# Patient Record
Sex: Male | Born: 1975
Health system: Southern US, Community
[De-identification: ages and names within clinical notes are randomized; demographics above are authoritative.]

## PROBLEM LIST (undated history)

## (undated) DIAGNOSIS — K625 Hemorrhage of anus and rectum: Secondary | ICD-10-CM

## (undated) DIAGNOSIS — T819XXA Unspecified complication of procedure, initial encounter: Secondary | ICD-10-CM

## (undated) DIAGNOSIS — Z8489 Family history of other specified conditions: Secondary | ICD-10-CM

## (undated) DIAGNOSIS — M1990 Unspecified osteoarthritis, unspecified site: Secondary | ICD-10-CM

## (undated) DIAGNOSIS — Z87442 Personal history of urinary calculi: Secondary | ICD-10-CM

## (undated) DIAGNOSIS — N471 Phimosis: Secondary | ICD-10-CM

## (undated) DIAGNOSIS — F32A Depression, unspecified: Secondary | ICD-10-CM

## (undated) DIAGNOSIS — G4730 Sleep apnea, unspecified: Secondary | ICD-10-CM

## (undated) DIAGNOSIS — M549 Dorsalgia, unspecified: Secondary | ICD-10-CM

## (undated) DIAGNOSIS — F329 Major depressive disorder, single episode, unspecified: Secondary | ICD-10-CM

## (undated) DIAGNOSIS — J45909 Unspecified asthma, uncomplicated: Secondary | ICD-10-CM

## (undated) DIAGNOSIS — I10 Essential (primary) hypertension: Secondary | ICD-10-CM

## (undated) HISTORY — DX: Hemorrhage of anus and rectum: K62.5

## (undated) HISTORY — PX: NO PAST SURGERIES: SHX2092

## (undated) HISTORY — DX: Sleep apnea, unspecified: G47.30

## (undated) HISTORY — DX: Unspecified complication of procedure, initial encounter: T81.9XXA

---

## 1998-03-27 ENCOUNTER — Emergency Department (HOSPITAL_COMMUNITY): Admission: EM | Admit: 1998-03-27 | Discharge: 1998-03-27 | Payer: Self-pay | Admitting: Emergency Medicine

## 1998-07-23 ENCOUNTER — Encounter: Payer: Self-pay | Admitting: Emergency Medicine

## 1998-07-23 ENCOUNTER — Emergency Department (HOSPITAL_COMMUNITY): Admission: EM | Admit: 1998-07-23 | Discharge: 1998-07-23 | Payer: Self-pay | Admitting: *Deleted

## 1998-07-24 ENCOUNTER — Emergency Department (HOSPITAL_COMMUNITY): Admission: EM | Admit: 1998-07-24 | Discharge: 1998-07-24 | Payer: Self-pay | Admitting: Emergency Medicine

## 1998-07-27 ENCOUNTER — Emergency Department (HOSPITAL_COMMUNITY): Admission: EM | Admit: 1998-07-27 | Discharge: 1998-07-27 | Payer: Self-pay | Admitting: Emergency Medicine

## 1998-12-13 ENCOUNTER — Emergency Department (HOSPITAL_COMMUNITY): Admission: EM | Admit: 1998-12-13 | Discharge: 1998-12-13 | Payer: Self-pay | Admitting: Emergency Medicine

## 1999-07-12 ENCOUNTER — Emergency Department (HOSPITAL_COMMUNITY): Admission: EM | Admit: 1999-07-12 | Discharge: 1999-07-12 | Payer: Self-pay | Admitting: Emergency Medicine

## 1999-09-29 ENCOUNTER — Emergency Department (HOSPITAL_COMMUNITY): Admission: EM | Admit: 1999-09-29 | Discharge: 1999-09-29 | Payer: Self-pay | Admitting: Emergency Medicine

## 1999-09-29 ENCOUNTER — Encounter: Payer: Self-pay | Admitting: Emergency Medicine

## 2000-01-18 ENCOUNTER — Emergency Department (HOSPITAL_COMMUNITY): Admission: EM | Admit: 2000-01-18 | Discharge: 2000-01-18 | Payer: Self-pay | Admitting: Emergency Medicine

## 2000-06-22 ENCOUNTER — Encounter: Payer: Self-pay | Admitting: Emergency Medicine

## 2000-06-22 ENCOUNTER — Emergency Department (HOSPITAL_COMMUNITY): Admission: EM | Admit: 2000-06-22 | Discharge: 2000-06-22 | Payer: Self-pay | Admitting: Emergency Medicine

## 2000-07-01 ENCOUNTER — Emergency Department (HOSPITAL_COMMUNITY): Admission: EM | Admit: 2000-07-01 | Discharge: 2000-07-01 | Payer: Self-pay | Admitting: Emergency Medicine

## 2000-07-25 ENCOUNTER — Encounter: Payer: Self-pay | Admitting: Emergency Medicine

## 2000-07-25 ENCOUNTER — Emergency Department (HOSPITAL_COMMUNITY): Admission: EM | Admit: 2000-07-25 | Discharge: 2000-07-25 | Payer: Self-pay | Admitting: Emergency Medicine

## 2000-11-10 ENCOUNTER — Emergency Department (HOSPITAL_COMMUNITY): Admission: AC | Admit: 2000-11-10 | Discharge: 2000-11-10 | Payer: Self-pay

## 2002-03-06 ENCOUNTER — Emergency Department (HOSPITAL_COMMUNITY): Admission: EM | Admit: 2002-03-06 | Discharge: 2002-03-06 | Payer: Self-pay | Admitting: *Deleted

## 2002-03-23 ENCOUNTER — Emergency Department (HOSPITAL_COMMUNITY): Admission: EM | Admit: 2002-03-23 | Discharge: 2002-03-23 | Payer: Self-pay | Admitting: Emergency Medicine

## 2002-12-26 ENCOUNTER — Emergency Department (HOSPITAL_COMMUNITY): Admission: EM | Admit: 2002-12-26 | Discharge: 2002-12-27 | Payer: Self-pay | Admitting: Emergency Medicine

## 2003-11-17 ENCOUNTER — Emergency Department (HOSPITAL_COMMUNITY): Admission: EM | Admit: 2003-11-17 | Discharge: 2003-11-17 | Payer: Self-pay | Admitting: Emergency Medicine

## 2004-04-11 ENCOUNTER — Emergency Department (HOSPITAL_COMMUNITY): Admission: EM | Admit: 2004-04-11 | Discharge: 2004-04-11 | Payer: Self-pay | Admitting: Emergency Medicine

## 2005-08-10 ENCOUNTER — Emergency Department (HOSPITAL_COMMUNITY): Admission: EM | Admit: 2005-08-10 | Discharge: 2005-08-10 | Payer: Self-pay | Admitting: *Deleted

## 2005-09-02 ENCOUNTER — Emergency Department (HOSPITAL_COMMUNITY): Admission: EM | Admit: 2005-09-02 | Discharge: 2005-09-02 | Payer: Self-pay | Admitting: Emergency Medicine

## 2005-10-24 ENCOUNTER — Emergency Department (HOSPITAL_COMMUNITY): Admission: EM | Admit: 2005-10-24 | Discharge: 2005-10-24 | Payer: Self-pay | Admitting: Family Medicine

## 2005-12-23 ENCOUNTER — Emergency Department (HOSPITAL_COMMUNITY): Admission: EM | Admit: 2005-12-23 | Discharge: 2005-12-23 | Payer: Self-pay | Admitting: Emergency Medicine

## 2005-12-27 ENCOUNTER — Emergency Department (HOSPITAL_COMMUNITY): Admission: EM | Admit: 2005-12-27 | Discharge: 2005-12-27 | Payer: Self-pay | Admitting: Family Medicine

## 2006-04-05 ENCOUNTER — Emergency Department (HOSPITAL_COMMUNITY): Admission: EM | Admit: 2006-04-05 | Discharge: 2006-04-05 | Payer: Self-pay | Admitting: Emergency Medicine

## 2009-01-05 ENCOUNTER — Emergency Department (HOSPITAL_COMMUNITY): Admission: EM | Admit: 2009-01-05 | Discharge: 2009-01-05 | Payer: Self-pay | Admitting: Emergency Medicine

## 2009-02-23 ENCOUNTER — Emergency Department (HOSPITAL_COMMUNITY): Admission: EM | Admit: 2009-02-23 | Discharge: 2009-02-23 | Payer: Self-pay | Admitting: Emergency Medicine

## 2009-10-16 ENCOUNTER — Emergency Department (HOSPITAL_BASED_OUTPATIENT_CLINIC_OR_DEPARTMENT_OTHER): Admission: EM | Admit: 2009-10-16 | Discharge: 2009-10-16 | Payer: Self-pay | Admitting: Emergency Medicine

## 2009-10-16 ENCOUNTER — Ambulatory Visit: Payer: Self-pay | Admitting: Diagnostic Radiology

## 2009-10-24 ENCOUNTER — Emergency Department (HOSPITAL_COMMUNITY): Admission: EM | Admit: 2009-10-24 | Discharge: 2009-10-24 | Payer: Self-pay | Admitting: Emergency Medicine

## 2010-04-28 ENCOUNTER — Emergency Department (HOSPITAL_COMMUNITY): Admission: EM | Admit: 2010-04-28 | Discharge: 2010-04-28 | Payer: Self-pay | Admitting: Emergency Medicine

## 2010-09-13 ENCOUNTER — Emergency Department (HOSPITAL_COMMUNITY)
Admission: EM | Admit: 2010-09-13 | Discharge: 2010-09-13 | Disposition: A | Discharge status: Home / Self Care | Payer: Self-pay | Attending: Emergency Medicine | Admitting: Emergency Medicine

## 2010-09-13 DIAGNOSIS — R05 Cough: Secondary | ICD-10-CM | POA: Insufficient documentation

## 2010-09-13 DIAGNOSIS — IMO0001 Reserved for inherently not codable concepts without codable children: Secondary | ICD-10-CM | POA: Insufficient documentation

## 2010-09-13 DIAGNOSIS — J069 Acute upper respiratory infection, unspecified: Secondary | ICD-10-CM | POA: Insufficient documentation

## 2010-09-13 DIAGNOSIS — J3489 Other specified disorders of nose and nasal sinuses: Secondary | ICD-10-CM | POA: Insufficient documentation

## 2010-09-13 DIAGNOSIS — R059 Cough, unspecified: Secondary | ICD-10-CM | POA: Insufficient documentation

## 2010-09-13 DIAGNOSIS — R070 Pain in throat: Secondary | ICD-10-CM | POA: Insufficient documentation

## 2010-10-12 LAB — URINALYSIS, ROUTINE W REFLEX MICROSCOPIC
Glucose, UA: NEGATIVE mg/dL
Hgb urine dipstick: NEGATIVE
Nitrite: NEGATIVE
Specific Gravity, Urine: 1.025 (ref 1.005–1.030)
Urobilinogen, UA: 1 mg/dL (ref 0.0–1.0)
pH: 6 (ref 5.0–8.0)

## 2010-10-12 LAB — DIFFERENTIAL
Basophils Relative: 0 % (ref 0–1)
Eosinophils Absolute: 0.1 10*3/uL (ref 0.0–0.7)
Eosinophils Relative: 2 % (ref 0–5)
Lymphs Abs: 2.1 10*3/uL (ref 0.7–4.0)

## 2010-10-12 LAB — COMPREHENSIVE METABOLIC PANEL
ALT: 15 U/L (ref 0–53)
AST: 21 U/L (ref 0–37)
Albumin: 3.6 g/dL (ref 3.5–5.2)
Alkaline Phosphatase: 102 U/L (ref 39–117)
CO2: 27 mEq/L (ref 19–32)
Chloride: 105 mEq/L (ref 96–112)
Creatinine, Ser: 0.91 mg/dL (ref 0.4–1.5)
GFR calc non Af Amer: >60 mL/min (ref >60)
Potassium: 4 mEq/L (ref 3.5–5.1)

## 2010-10-12 LAB — CBC
MCH: 30.2 pg (ref 26.0–34.0)
Platelets: 159 10*3/uL (ref 150–400)
RBC: 5.03 MIL/uL (ref 4.22–5.81)
RDW: 13.3 % (ref 11.5–15.5)
WBC: 7.7 10*3/uL (ref 4.0–10.5)

## 2010-10-12 LAB — LIPASE, BLOOD: Lipase: 23 U/L (ref 11–59)

## 2010-10-22 LAB — URINALYSIS, ROUTINE W REFLEX MICROSCOPIC
Hgb urine dipstick: NEGATIVE
Nitrite: NEGATIVE
Urobilinogen, UA: 1 mg/dL (ref 0.0–1.0)
pH: 6 (ref 5.0–8.0)

## 2010-12-14 ENCOUNTER — Emergency Department (HOSPITAL_BASED_OUTPATIENT_CLINIC_OR_DEPARTMENT_OTHER): Admission: EM | Admit: 2010-12-14 | Payer: Self-pay | Source: Home / Self Care

## 2010-12-19 ENCOUNTER — Emergency Department (HOSPITAL_COMMUNITY)
Admission: EM | Admit: 2010-12-19 | Discharge: 2010-12-19 | Disposition: A | Discharge status: Home / Self Care | Payer: Self-pay | Attending: Emergency Medicine | Admitting: Emergency Medicine

## 2010-12-19 DIAGNOSIS — E669 Obesity, unspecified: Secondary | ICD-10-CM | POA: Insufficient documentation

## 2010-12-19 DIAGNOSIS — S90569A Insect bite (nonvenomous), unspecified ankle, initial encounter: Secondary | ICD-10-CM | POA: Insufficient documentation

## 2011-10-12 ENCOUNTER — Encounter (HOSPITAL_COMMUNITY): Payer: Self-pay

## 2011-10-12 ENCOUNTER — Emergency Department (HOSPITAL_COMMUNITY)
Admission: EM | Admit: 2011-10-12 | Discharge: 2011-10-12 | Disposition: A | Discharge status: Home / Self Care | Payer: Self-pay | Attending: Emergency Medicine | Admitting: Emergency Medicine

## 2011-10-12 DIAGNOSIS — I10 Essential (primary) hypertension: Secondary | ICD-10-CM | POA: Insufficient documentation

## 2011-10-12 DIAGNOSIS — R05 Cough: Secondary | ICD-10-CM | POA: Insufficient documentation

## 2011-10-12 DIAGNOSIS — R059 Cough, unspecified: Secondary | ICD-10-CM | POA: Insufficient documentation

## 2011-10-12 DIAGNOSIS — H9209 Otalgia, unspecified ear: Secondary | ICD-10-CM | POA: Insufficient documentation

## 2011-10-12 DIAGNOSIS — J069 Acute upper respiratory infection, unspecified: Secondary | ICD-10-CM

## 2011-10-12 DIAGNOSIS — H6690 Otitis media, unspecified, unspecified ear: Secondary | ICD-10-CM

## 2011-10-12 HISTORY — DX: Essential (primary) hypertension: I10

## 2011-10-12 MED ORDER — IBUPROFEN 800 MG PO TABS
800.0000 mg | ORAL_TABLET | Freq: Once | ORAL | Status: AC
Start: 2011-10-12 — End: 2011-10-12
  Administered 2011-10-12: 800 mg via ORAL
  Filled 2011-10-12: qty 1

## 2011-10-12 MED ORDER — ANTIPYRINE-BENZOCAINE 5.4-1.4 % OT SOLN
3.0000 [drp] | Freq: Once | OTIC | Status: AC
  Administered 2011-10-12: 4 [drp] via OTIC
  Filled 2011-10-12: qty 10

## 2011-10-12 MED ORDER — CETIRIZINE-PSEUDOEPHEDRINE ER 5-120 MG PO TB12: 1.0000 | ORAL_TABLET | Freq: Two times a day (BID) | ORAL | Status: DC | PRN

## 2011-10-12 MED ORDER — AZITHROMYCIN 250 MG PO TABS: ORAL_TABLET | ORAL | Status: AC

## 2011-10-12 NOTE — ED Notes (Signed)
Patient presents with a chief complaint of earache to left ear since this AM.  Patient also reporting cough/congestion x 1 week.  Denies sore throat.

## 2011-10-12 NOTE — Discharge Instructions (Signed)
Take zyrtec d as need for congestion. Take zithromax (antibiotic) as prescribed. Take tylenol/motrin as need.  Follow up with primary care doctor in 3-4 days if symptoms fail to improve/resolve.  Return to ER if worse, severe ear pain, severe headache, hearing loss, other concern.     Otitis Media, Adult A middle ear infection is an infection in the space behind the eardrum. The medical name for this is "otitis media." It may happen after a common cold. It is caused by a germ that starts growing in that space. You may feel swollen glands in your neck on the side of the ear infection. HOME CARE INSTRUCTIONS   Take your medicine as directed until it is gone, even if you feel better after the first few days.   Only take over-the-counter or prescription medicines for pain, discomfort, or fever as directed by your caregiver.   Occasional use of a nasal decongestant a couple times per day may help with discomfort and help the eustachian tube to drain better.  Follow up with your caregiver in 10 to 14 days or as directed, to be certain that the infection has cleared. Not keeping the appointment could result in a chronic or permanent injury, pain, hearing loss and disability. If there is any problem keeping the appointment, you must call back to this facility for assistance. SEEK IMMEDIATE MEDICAL CARE IF:   You are not getting better in 2 to 3 days.   You have pain that is not controlled with medication.   You feel worse instead of better.   You cannot use the medication as directed.   You develop swelling, redness or pain around the ear or stiffness in your neck.  MAKE SURE YOU:   Understand these instructions.   Will watch your condition.   Will get help right away if you are not doing well or get worse.  Document Released: 04/20/2004 Document Revised: 07/05/2011 Document Reviewed: 02/20/2008 [facility] Patient Information 2012 [place], [place].

## 2011-10-12 NOTE — ED Provider Notes (Signed)
History     CSN: [id number]  Arrival date & time 10/12/11  1742   First MD Initiated Contact with Patient 10/12/11 1954      Chief Complaint  Patient presents with  . Otalgia  . Cough    (Consider location/radiation/quality/duration/timing/severity/associated sxs/prior treatment) Patient is a 36 y.o. male presenting with ear pain and cough. The history is provided by the patient.  Otalgia Associated symptoms include cough. Pertinent negatives include no headaches, no abdominal pain, no neck pain and no rash.  Cough Associated symptoms include ear pain. Pertinent negatives include no chest pain, no headaches, no shortness of breath and no eye redness.  pt c/o left ear pain. Notes recent 'cold' w nasal congestion, rhinorrhea, scratchy throat, occ non prod cough. Those symptoms improving, but persistent left ear pain. Constant. Dull. Non radiating. No specific exacerbating or alleviating factors. No headache. No hearing loss. No trauma to ear. Hx otitis media.   Past Medical History  Diagnosis Date  . Hypertension     History reviewed. No pertinent past surgical history.  Family History  Problem Relation Age of Onset  . Cancer Mother   . Cancer Father   . Diabetes Father   . Hypertension Father     History  Substance Use Topics  . Smoking status: Never Smoker   . Smokeless tobacco: Not on file  . Alcohol Use: No      Review of Systems  Constitutional: Negative for fever.  HENT: Positive for ear pain. Negative for neck pain.   Eyes: Negative for redness.  Respiratory: Positive for cough. Negative for shortness of breath.   Cardiovascular: Negative for chest pain.  Gastrointestinal: Negative for abdominal pain.  Musculoskeletal: Negative for back pain.  Skin: Negative for rash.  Neurological: Positive for numbness. Negative for headaches.  Hematological: Does not bruise/bleed easily.  Psychiatric/Behavioral: Negative for confusion.    Allergies   Penicillins  Home Medications  No current outpatient prescriptions on file.  BP 126/87  Pulse 94  Temp 98 F (36.7 C)  Resp 18  SpO2 99%  Physical Exam  Nursing note and vitals reviewed. Constitutional: He is oriented to person, place, and time. He appears well-developed and well-nourished. No distress.  HENT:  Head: Atraumatic.       No mastoid tenderness. Nasal congestion. No sinus tenderness. Right eac/tm wnl. Left eac wnl. Left tm erythematous, bulging.   Eyes: Pupils are equal, round, and reactive to light.  Neck: Neck supple. No tracheal deviation present.       No stiffness/rigidity  Cardiovascular: Normal rate, regular rhythm, normal heart sounds and intact distal pulses.   Pulmonary/Chest: Effort normal and breath sounds normal. No accessory muscle usage. No respiratory distress.  Abdominal: Soft. He exhibits no distension. There is no tenderness.  Musculoskeletal: Normal range of motion.  Neurological: He is alert and oriented to person, place, and time.       Hearing grossly intact.   Skin: Skin is warm and dry.  Psychiatric: He has a normal mood and affect.    ED Course  Procedures (including critical care time)     MDM  Auralgan drops. Motrin po. Confirmed w pt only allergy is pcns.         Suzi Roots, MD 10/12/11 2004

## 2011-10-22 ENCOUNTER — Emergency Department (HOSPITAL_COMMUNITY)
Admission: EM | Admit: 2011-10-22 | Discharge: 2011-10-22 | Disposition: A | Discharge status: Home Health | Payer: Self-pay | Attending: Emergency Medicine | Admitting: Emergency Medicine

## 2011-10-22 ENCOUNTER — Encounter (HOSPITAL_COMMUNITY): Payer: Self-pay | Admitting: Emergency Medicine

## 2011-10-22 DIAGNOSIS — I10 Essential (primary) hypertension: Secondary | ICD-10-CM | POA: Insufficient documentation

## 2011-10-22 DIAGNOSIS — H60399 Other infective otitis externa, unspecified ear: Secondary | ICD-10-CM | POA: Insufficient documentation

## 2011-10-22 DIAGNOSIS — H6092 Unspecified otitis externa, left ear: Secondary | ICD-10-CM

## 2011-10-22 MED ORDER — CIPROFLOXACIN-DEXAMETHASONE 0.3-0.1 % OT SUSP: 4.0000 [drp] | Freq: Two times a day (BID) | OTIC | Status: DC

## 2011-10-22 NOTE — ED Notes (Signed)
Pt c/o left ear pain x 2 weeks; pt sts seen for same in past and given meds but not any  better

## 2011-10-22 NOTE — ED Notes (Signed)
Discharge instructions reviewed with pt; verbalized understanding.  No further questions asked; No further c/o's voiced.  Pt ambulatory to lobby.  NAD noted.

## 2011-10-22 NOTE — ED Provider Notes (Signed)
History     CSN: [id number]  Arrival date & time 10/22/11  1059   First MD Initiated Contact with Patient 10/22/11 1132      Chief Complaint  Patient presents with  . Otalgia    (Consider location/radiation/quality/duration/timing/severity/associated sxs/prior treatment) Patient is a 36 y.o. male presenting with ear pain. The history is provided by the patient. No language interpreter was used.  Otalgia This is a recurrent problem. There is pain in the left ear. The problem occurs constantly. The problem has been gradually worsening. There has been no fever. Associated symptoms include hearing loss. Pertinent negatives include no ear discharge, no headaches, no rhinorrhea, no sore throat, no diarrhea, no vomiting, no neck pain, no cough and no rash. His past medical history is significant for chronic ear infection, hearing loss and tympanostomy tube.   L ear pain with decreased hearing x 2 weeks.  States that he had tubes in both ears at age 20.  Was here 2 weeks ago and was given a rx for z-pack and zyrtec.    Past Medical History  Diagnosis Date  . Hypertension     History reviewed. No pertinent past surgical history.  Family History  Problem Relation Age of Onset  . Cancer Mother   . Cancer Father   . Diabetes Father   . Hypertension Father     History  Substance Use Topics  . Smoking status: Never Smoker   . Smokeless tobacco: Not on file  . Alcohol Use: No      Review of Systems  Unable to perform ROS Constitutional: Negative.   HENT: Positive for hearing loss and ear pain. Negative for sore throat, rhinorrhea, neck pain and ear discharge.   Eyes: Negative.   Respiratory: Negative.  Negative for cough and shortness of breath.   Cardiovascular: Negative.   Gastrointestinal: Negative.  Negative for vomiting and diarrhea.  Skin: Negative for rash.  Neurological: Negative.  Negative for headaches.  Psychiatric/Behavioral: Negative.   All other systems reviewed  and are negative.    Allergies  Penicillins  Home Medications   Current Outpatient Rx  Name Route Sig Dispense Refill  . CETIRIZINE-PSEUDOEPHEDRINE ER 5-120 MG PO TB12 Oral Take 1 tablet by mouth 2 (two) times daily as needed for allergies. 20 tablet 0    BP 157/69  Pulse 69  Temp(Src) 97.7 F (36.5 C) (Oral)  Resp 20  SpO2 97%  Physical Exam  Nursing note and vitals reviewed. Constitutional: He is oriented to person, place, and time. He appears well-developed and well-nourished.  HENT:  Head: Normocephalic.  Right Ear: Tympanic membrane normal. No tenderness.  Left Ear: There is tenderness. Decreased hearing is noted.  Nose: Nose normal.  Mouth/Throat: Uvula is midline, oropharynx is clear and moist and mucous membranes are normal.  Eyes: Conjunctivae and EOM are normal. Pupils are equal, round, and reactive to light.  Neck: Normal range of motion. Neck supple.  Cardiovascular: Normal rate.   Pulmonary/Chest: Effort normal.  Abdominal: Soft.  Musculoskeletal: Normal range of motion.  Neurological: He is alert and oriented to person, place, and time.  Skin: Skin is warm and dry.  Psychiatric: He has a normal mood and affect.    ED Course  Procedures (including critical care time)  Labs Reviewed - No data to display No results found.   No diagnosis found.    MDM  Otitis externa.  Unable to see L TM.  Did not flush in ER.  Ciprodex and ENT consult  Jethro Bastos, NP 10/23/11 1128

## 2011-10-22 NOTE — Discharge Instructions (Signed)
Reginald Campbell follow up with ENT this week.  Start the antibiotics today. Otitis Externa Swimmer's ear (otitis externa) is an infection in the outer ear canal. It can be caused by a germ or a fungus. It may be caused by:  Swimming in dirty water.   Water that stays in the ear after swimming.  HOME CARE  Put drops in the ear canal as told by your doctor.   Only take medicine as told by your doctor.  GET HELP RIGHT AWAY IF:   You have a temperature by mouth above 102 F (38.9 C), not controlled by medicine.   There is ear pain after 3 days.  MAKE SURE YOU:   Understand these instructions.   Will watch this condition.   Will get help right away if you are not doing well or get worse.  Document Released: 01/02/2008 Document Revised: 07/05/2011 Document Reviewed: 01/02/2008 [facility] Patient Information 2012 [place], [place].Otitis Externa Swimmer's ear (otitis externa) is an infection in the outer ear canal. It can be caused by a germ or a fungus. It may be caused by:  Swimming in dirty water.   Water that stays in the ear after swimming.  HOME CARE  Put drops in the ear canal as told by your doctor.   Only take medicine as told by your doctor.  GET HELP RIGHT AWAY IF:   You have a temperature by mouth above 102 F (38.9 C), not controlled by medicine.   There is ear pain after 3 days.  MAKE SURE YOU:   Understand these instructions.   Will watch this condition.   Will get help right away if you are not doing well or get worse.  Document Released: 01/02/2008 Document Revised: 07/05/2011 Document Reviewed: 01/02/2008 [facility] Patient Information 2012 [place], [place].Draining Ear Ear wax, pus, blood and other fluids are examples of the different types of drainage from ears. Drops or cream may be needed to lessen the itching which may occur with ear drainage. CAUSES   Skin irritations in the ear.   Ear infection.   Swimmer's ear.   Ruptured eardrum.   Foreign object in  the ear canal.   Sudden pressure changes.   Head injury.  HOME CARE INSTRUCTIONS   Only take over-the-counter or prescription medicines for pain, fever, or discomfort as directed by your caregiver.   Do not rub the ear canal with cotton-tipped swabs.   Do not swim until your caregiver says it is okay.   Before you take a shower, cover a cotton ball with petroleum jelly to keep water out.   Limit exposure to smoke. Secondhand smoke can increase the chance for ear infections.   Keep up with immunizations.   Wash your hands well.   Keep all follow-up appointments to examine the ear and evaluate hearing.  SEEK MEDICAL CARE IF:   You have increased drainage.   You have ear pain, a fever, or drainage that is not getting better after 48 hours of antibiotics.   You are unusually tired.  SEEK IMMEDIATE MEDICAL CARE IF:  You have severe ear pain or headache.   The patient is older than 3 months with a rectal or oral temperature of 102 F (38.9 C) or higher.   The patient is 54 months old or younger with a rectal temperature of 100.4 F (38 C) or higher.   You vomit.   You feel dizzy.   You have a seizure.   You have new hearing loss.  MAKE SURE YOU:   Understand these instructions.   Will watch your condition.   Will get help right away if you are not doing well or get worse.  Document Released: 07/16/2005 Document Revised: 07/05/2011 Document Reviewed: 05/19/2009 [facility] Patient Information 2012 [place], [place].

## 2011-10-24 NOTE — ED Provider Notes (Signed)
Medical screening examination/treatment/procedure(s) were performed by non-physician practitioner and as supervising physician I was immediately available for consultation/collaboration.  Havish Petties, MD 10/24/11 1417 

## 2011-11-11 ENCOUNTER — Encounter (HOSPITAL_COMMUNITY): Payer: Self-pay | Admitting: *Deleted

## 2011-11-11 ENCOUNTER — Emergency Department (HOSPITAL_COMMUNITY)
Admission: EM | Admit: 2011-11-11 | Discharge: 2011-11-11 | Disposition: A | Discharge status: Home / Self Care | Payer: Self-pay | Attending: Emergency Medicine | Admitting: Emergency Medicine

## 2011-11-11 DIAGNOSIS — H6120 Impacted cerumen, unspecified ear: Secondary | ICD-10-CM | POA: Insufficient documentation

## 2011-11-11 DIAGNOSIS — H6090 Unspecified otitis externa, unspecified ear: Secondary | ICD-10-CM

## 2011-11-11 DIAGNOSIS — H60399 Other infective otitis externa, unspecified ear: Secondary | ICD-10-CM | POA: Insufficient documentation

## 2011-11-11 DIAGNOSIS — I10 Essential (primary) hypertension: Secondary | ICD-10-CM | POA: Insufficient documentation

## 2011-11-11 MED ORDER — TRIETHANOLAMINE SALICYLATE LIQD: 2.0000 [drp] | Freq: Two times a day (BID) | Status: DC

## 2011-11-11 MED ORDER — NEOMYCIN-POLYMYXIN-HC 3.5-10000-1 OT SUSP: 4.0000 [drp] | Freq: Three times a day (TID) | OTIC | Status: AC

## 2011-11-11 NOTE — ED Provider Notes (Addendum)
History     CSN: [id number]  Arrival date & time 11/11/11  1946   First MD Initiated Contact with Patient 11/11/11 2042      Chief Complaint  Patient presents with  . Otalgia    (Consider location/radiation/quality/duration/timing/severity/associated sxs/prior treatment) HPI  Pt presents to the ED for ear pain. He has been seen two times previously for the same and given antibiotics. He states that he was referred to ENT but can not afford to see them. He states that he feels as though his ear is clogged. It did feel better for a little while but now is starting to hurt again. No fevers, chills, weakness, headaches, N/V/D.  Past Medical History  Diagnosis Date  . Hypertension     History reviewed. No pertinent past surgical history.  Family History  Problem Relation Age of Onset  . Cancer Mother   . Cancer Father   . Diabetes Father   . Hypertension Father     History  Substance Use Topics  . Smoking status: Never Smoker   . Smokeless tobacco: Not on file  . Alcohol Use: No      Review of Systems  All other systems reviewed and are negative.    Allergies  Penicillins  Home Medications   Current Outpatient Rx  Name Route Sig Dispense Refill  . NEOMYCIN-POLYMYXIN-HC 3.5-10000-1 OT SUSP Otic Place 4 drops in ear(s) 3 (three) times daily. 7.5 mL 0  . TRIETHANOLAMINE SALICYLATE LIQD Otic Place 2 drops in ear(s) 2 (two) times daily. 1 Bottle 0    BP 133/88  Pulse 76  Temp(Src) 98.4 F (36.9 C) (Oral)  Resp 18  Ht 6' (1.829 m)  Wt 330 lb (149.687 kg)  BMI 44.76 kg/m2  SpO2 99%  Physical Exam  Nursing note and vitals reviewed. Constitutional: He appears well-developed and well-nourished. No distress.  HENT:  Head: Normocephalic and atraumatic.  Right Ear: Tympanic membrane normal.  Left Ear: Tympanic membrane normal.       Bilateral canals cerumen impacted.  After cerumen removal, some cerumen remains. Cerumen is very white in color and hard to  differentiate between cerumen and infection. TMs are normal.  Eyes: Pupils are equal, round, and reactive to light.  Neck: Normal range of motion. Neck supple.  Cardiovascular: Normal rate and regular rhythm.   Pulmonary/Chest: Effort normal.  Abdominal: Soft.  Neurological: He is alert.  Skin: Skin is warm and dry.    ED Course  Procedures (including critical care time)  Labs Reviewed - No data to display No results found.   1. Otitis externa       MDM  Ear irrigated by Nurse, large amount of cerumen removed.  After cerumen removal, some cerumen remains. Cerumen is very white in color and hard to differentiate between cerumen and infection. TMs are normal.  Given referral to ENT. Rx for cerumenex and abx otic drops.  Pt has been advised of the symptoms that warrant their return to the ED. Patient has voiced understanding and has agreed to follow-up with the PCP or specialist.        Dorthula Matas, PA 11/11/11 2152  Dorthula Matas, PA 12/28/11 7829

## 2011-11-11 NOTE — ED Notes (Signed)
Flushed both ears with warm water.  Pt tolerated well.  Large amount of ear wax/substance removed.

## 2011-11-11 NOTE — ED Notes (Signed)
Pt reports left ear pain for two weeks.  Pt reports he can not hear out of it. Pt denies drainage currently, but reports previously had ear wax and drainage coming from ear. Pt was seen at [facility] two weeks ago and diagnosed with otitis externa.  Pt reports his mother put too many drops in left ear.

## 2011-11-15 NOTE — ED Provider Notes (Signed)
Medical screening examination/treatment/procedure(s) were performed by non-physician practitioner and as supervising physician I was immediately available for consultation/collaboration.  Aamari Strawderman, MD 11/15/11 1750 

## 2011-11-24 ENCOUNTER — Emergency Department (HOSPITAL_COMMUNITY)
Admission: EM | Admit: 2011-11-24 | Discharge: 2011-11-24 | Disposition: A | Discharge status: Home / Self Care | Payer: Self-pay | Attending: Emergency Medicine | Admitting: Emergency Medicine

## 2011-11-24 ENCOUNTER — Encounter (HOSPITAL_COMMUNITY): Payer: Self-pay

## 2011-11-24 DIAGNOSIS — Z880 Allergy status to penicillin: Secondary | ICD-10-CM | POA: Insufficient documentation

## 2011-11-24 DIAGNOSIS — I10 Essential (primary) hypertension: Secondary | ICD-10-CM | POA: Insufficient documentation

## 2011-11-24 DIAGNOSIS — Z8249 Family history of ischemic heart disease and other diseases of the circulatory system: Secondary | ICD-10-CM | POA: Insufficient documentation

## 2011-11-24 DIAGNOSIS — H60399 Other infective otitis externa, unspecified ear: Secondary | ICD-10-CM | POA: Insufficient documentation

## 2011-11-24 DIAGNOSIS — Z809 Family history of malignant neoplasm, unspecified: Secondary | ICD-10-CM | POA: Insufficient documentation

## 2011-11-24 DIAGNOSIS — Z833 Family history of diabetes mellitus: Secondary | ICD-10-CM | POA: Insufficient documentation

## 2011-11-24 DIAGNOSIS — H6092 Unspecified otitis externa, left ear: Secondary | ICD-10-CM

## 2011-11-24 MED ORDER — NEOMYCIN-POLYMYXIN-HC 3.5-10000-1 OT SUSP: 3.0000 [drp] | Freq: Three times a day (TID) | OTIC | Status: AC

## 2011-11-24 NOTE — ED Notes (Signed)
Pt reports a month of hearing difficulty with mild pain in the left ear

## 2011-11-24 NOTE — Discharge Instructions (Signed)
Otitis Externa Swimmer's ear (otitis externa) is an infection in the outer ear canal. It can be caused by a germ or a fungus. It may be caused by:  Swimming in dirty water.   Water that stays in the ear after swimming.  HOME CARE  Put drops in the ear canal as told by your doctor.   Only take medicine as told by your doctor.  GET HELP RIGHT AWAY IF:   You have a temperature by mouth above 102 F (38.9 C), not controlled by medicine.   There is ear pain after 3 days.  MAKE SURE YOU:   Understand these instructions.   Will watch this condition.   Will get help right away if you are not doing well or get worse.  Document Released: 01/02/2008 Document Revised: 07/05/2011 Document Reviewed: 01/02/2008 [facility] Patient Information 2012 [place], [place].  Apply ear drops 3 times a day to left ear. Then applied cotton wick. Tylenol or Motrin for pain. If symptoms persist recommend seeing an ENT doctor. Number given

## 2011-11-24 NOTE — ED Notes (Signed)
Irrigation of the left ear with 50% peroxide and 50% NS, soaking at this time

## 2011-11-24 NOTE — ED Provider Notes (Signed)
History     CSN: [id number]  Arrival date & time 11/24/11  1249   First MD Initiated Contact with Patient 11/24/11 1300      Chief Complaint  Patient presents with  . Hearing Problem    (Consider location/radiation/quality/duration/timing/severity/associated sxs/prior treatment) HPI.... third ED visit for patient with left ear discomfort for approximately one month. Decreased hearing. No fever, chills, stiff neck. Nothing makes it better or worse. Symptoms are minimal to moderate  Past Medical History  Diagnosis Date  . Hypertension     History reviewed. No pertinent past surgical history.  Family History  Problem Relation Age of Onset  . Cancer Mother   . Cancer Father   . Diabetes Father   . Hypertension Father     History  Substance Use Topics  . Smoking status: Never Smoker   . Smokeless tobacco: Not on file  . Alcohol Use: No      Review of Systems  All other systems reviewed and are negative.    Allergies  Penicillins  Home Medications   Current Outpatient Rx  Name Route Sig Dispense Refill  . NEOMYCIN-POLYMYXIN-HC 3.5-10000-1 OT SUSP Left Ear Place 3 drops into the left ear 3 (three) times daily. 10 mL 0  . TRIETHANOLAMINE OLEATE OT Otic Place 2 drops in ear(s) 2 (two) times daily. Filled 11/11/11 - finished 11/22/11      BP 109/72  Pulse 72  Temp(Src) 97.8 F (36.6 C) (Oral)  Resp 13  SpO2 97%  Physical Exam  Nursing note and vitals reviewed. Constitutional: He is oriented to person, place, and time.       Obese  HENT:  Head: Normocephalic.       Left tympanic membrane obscured by cerumen. External canal appears inflamed and erythematous  Neck: Normal range of motion.  Neurological: He is alert and oriented to person, place, and time.  Skin: Skin is warm and dry.  Psychiatric: He has a normal mood and affect.    ED Course  Procedures (including critical care time)... left ear irrigated with normal saline and hydrogen peroxide. Large  amount of cerumen was mined with a curette.  Left external canal inflamed.  Labs Reviewed - No data to display No results found.   1. Otitis externa, left       MDM  Ear was irrigated. Large amount of cerumen. We'll discharge him with Cortisporin Otic        Donnetta Hutching, MD 11/24/11 1615

## 2011-12-29 NOTE — ED Provider Notes (Signed)
Medical screening examination/treatment/procedure(s) were performed by non-physician practitioner and as supervising physician I was immediately available for consultation/collaboration.  Raeford Razor, MD 12/29/11 [phone]

## 2012-01-14 ENCOUNTER — Encounter (HOSPITAL_COMMUNITY): Payer: Self-pay | Admitting: Emergency Medicine

## 2012-01-14 ENCOUNTER — Emergency Department (HOSPITAL_COMMUNITY)
Admission: EM | Admit: 2012-01-14 | Discharge: 2012-01-14 | Disposition: A | Discharge status: Home / Self Care | Payer: Self-pay

## 2012-01-14 ENCOUNTER — Emergency Department (HOSPITAL_COMMUNITY)
Admission: EM | Admit: 2012-01-14 | Discharge: 2012-01-14 | Disposition: A | Discharge status: Home / Self Care | Payer: Self-pay | Attending: Emergency Medicine | Admitting: Emergency Medicine

## 2012-01-14 DIAGNOSIS — H7290 Unspecified perforation of tympanic membrane, unspecified ear: Secondary | ICD-10-CM | POA: Insufficient documentation

## 2012-01-14 MED ORDER — IBUPROFEN 600 MG PO TABS: 600.0000 mg | ORAL_TABLET | Freq: Four times a day (QID) | ORAL | Status: AC | PRN

## 2012-01-14 MED ORDER — CIPROFLOXACIN-DEXAMETHASONE 0.3-0.1 % OT SUSP: 4.0000 [drp] | Freq: Two times a day (BID) | OTIC | Status: AC

## 2012-01-14 MED ORDER — CIPROFLOXACIN-DEXAMETHASONE 0.3-0.1 % OT SUSP
4.0000 [drp] | Freq: Once | OTIC | Status: AC
  Administered 2012-01-14: 4 [drp] via OTIC
  Filled 2012-01-14: qty 7.5

## 2012-01-14 NOTE — ED Notes (Signed)
PT reports he has had ear congestion for several months that has never fully cleared up, sneezed today and reports scant amount of blood came from left ear; sinus issues; can hear out of left ear but reports it is muffled. Mild pain associated.

## 2012-01-14 NOTE — ED Notes (Signed)
Waiting for ear drops from pharmacy. 

## 2012-01-14 NOTE — ED Notes (Signed)
PT ambulated with a  Steady gait; VSS; A&Ox3; no signs of distress; respirations even and unlabored; skin warm and dry; no questions at this time.  

## 2012-01-14 NOTE — ED Provider Notes (Signed)
History     CSN: [id number]  Arrival date & time 01/14/12  2057   First MD Initiated Contact with Patient 01/14/12 2223      Chief Complaint  Patient presents with  . Ear Drainage    (Consider location/radiation/quality/duration/timing/severity/associated sxs/prior treatment) HPI Comments: Patient here with left ear congestion and fullness for the past several months - states that he sneezed today and noticed blood coming from the left ear at that time - reports mild pain  - no fever, chills, purulent drainage from the ear - reports muffled hearing but no change since the sneezing.  Patient is a 36 y.o. male presenting with ear drainage. The history is provided by the patient. No language interpreter was used.  Ear Drainage This is a new problem. The current episode started today. The problem occurs constantly. The problem has been unchanged. Associated symptoms include congestion. Pertinent negatives include no abdominal pain, anorexia, arthralgias, change in bowel habit, chest pain, chills, coughing, diaphoresis, fatigue, fever, headaches, joint swelling, myalgias, nausea, neck pain, numbness, rash, sore throat, swollen glands, urinary symptoms, vertigo, visual change, vomiting or weakness. The symptoms are aggravated by sneezing. He has tried nothing for the symptoms. The treatment provided no relief.    Past Medical History  Diagnosis Date  . Hypertension     No past surgical history on file.  Family History  Problem Relation Age of Onset  . Cancer Mother   . Cancer Father   . Diabetes Father   . Hypertension Father     History  Substance Use Topics  . Smoking status: Never Smoker   . Smokeless tobacco: Not on file  . Alcohol Use: No      Review of Systems  Constitutional: Negative for fever, chills, diaphoresis and fatigue.  HENT: Positive for congestion. Negative for sore throat and neck pain.   Respiratory: Negative for cough.   Cardiovascular: Negative for  chest pain.  Gastrointestinal: Negative for nausea, vomiting, abdominal pain, anorexia and change in bowel habit.  Musculoskeletal: Negative for myalgias, joint swelling and arthralgias.  Skin: Negative for rash.  Neurological: Negative for vertigo, weakness, numbness and headaches.  All other systems reviewed and are negative.    Allergies  Penicillins  Home Medications  No current outpatient prescriptions on file.  BP 109/65  Pulse 78  Temp 98.2 F (36.8 C) (Oral)  Resp 18  SpO2 96%  Physical Exam  Nursing note and vitals reviewed. Constitutional: He is oriented to person, place, and time. He appears well-developed and well-nourished. No distress.  HENT:  Head: Normocephalic and atraumatic.  Right Ear: External ear normal.  Nose: Nose normal.  Mouth/Throat: Oropharynx is clear and moist. No oropharyngeal exudate.       Small amount dried blood in canal - perforation of left TM  Eyes: Conjunctivae are normal. Pupils are equal, round, and reactive to light. No scleral icterus.  Neck: Normal range of motion. Neck supple.  Cardiovascular: Normal rate, regular rhythm and normal heart sounds.  Exam reveals no gallop and no friction rub.   No murmur heard. Pulmonary/Chest: Effort normal and breath sounds normal. No respiratory distress. He has no wheezes. He has no rales. He exhibits no tenderness.  Abdominal: Soft. Bowel sounds are normal. He exhibits no distension. There is no tenderness.  Musculoskeletal: Normal range of motion. He exhibits no edema and no tenderness.  Lymphadenopathy:    He has no cervical adenopathy.  Neurological: He is alert and oriented to person, place, and time.  No cranial nerve deficit.  Skin: Skin is warm and dry. No rash noted. No erythema. No pallor.  Psychiatric: He has a normal mood and affect. His behavior is normal. Judgment and thought content normal.    ED Course  Procedures (including critical care time)  Labs Reviewed - No data to  display No results found.   Perforated left TM   MDM  Patient here with perforation of left TM - placed on ciprodex here and will give ibuprofen as well for the pain.        Izola Price [place], [place] 01/14/12 [phone]

## 2012-01-14 NOTE — ED Notes (Signed)
Pt called to be triaged, no answer in triage, waiting room, or lobby

## 2012-01-14 NOTE — ED Notes (Signed)
The pt is laughing and playing games with his daughter approx age 36 yrs.

## 2012-01-15 NOTE — ED Provider Notes (Signed)
Medical screening examination/treatment/procedure(s) were performed by non-physician practitioner and as supervising physician I was immediately available for consultation/collaboration.  Flint Melter, MD 01/15/12 2019

## 2012-09-09 ENCOUNTER — Encounter (HOSPITAL_COMMUNITY): Payer: Self-pay | Admitting: Adult Health

## 2012-09-09 ENCOUNTER — Emergency Department (HOSPITAL_COMMUNITY)
Admission: EM | Admit: 2012-09-09 | Discharge: 2012-09-09 | Disposition: A | Discharge status: Home / Self Care | Payer: Medicaid Other | Attending: Emergency Medicine | Admitting: Emergency Medicine

## 2012-09-09 ENCOUNTER — Emergency Department (HOSPITAL_COMMUNITY): Payer: Medicaid Other

## 2012-09-09 DIAGNOSIS — Z87442 Personal history of urinary calculi: Secondary | ICD-10-CM | POA: Insufficient documentation

## 2012-09-09 DIAGNOSIS — M545 Low back pain, unspecified: Secondary | ICD-10-CM | POA: Insufficient documentation

## 2012-09-09 DIAGNOSIS — Y9239 Other specified sports and athletic area as the place of occurrence of the external cause: Secondary | ICD-10-CM | POA: Insufficient documentation

## 2012-09-09 DIAGNOSIS — S59909A Unspecified injury of unspecified elbow, initial encounter: Secondary | ICD-10-CM | POA: Insufficient documentation

## 2012-09-09 DIAGNOSIS — M25529 Pain in unspecified elbow: Secondary | ICD-10-CM

## 2012-09-09 DIAGNOSIS — S6990XA Unspecified injury of unspecified wrist, hand and finger(s), initial encounter: Secondary | ICD-10-CM | POA: Insufficient documentation

## 2012-09-09 DIAGNOSIS — W2209XA Striking against other stationary object, initial encounter: Secondary | ICD-10-CM | POA: Insufficient documentation

## 2012-09-09 DIAGNOSIS — I10 Essential (primary) hypertension: Secondary | ICD-10-CM | POA: Insufficient documentation

## 2012-09-09 DIAGNOSIS — Y9389 Activity, other specified: Secondary | ICD-10-CM | POA: Insufficient documentation

## 2012-09-09 HISTORY — DX: Dorsalgia, unspecified: M54.9

## 2012-09-09 MED ORDER — NAPROXEN 500 MG PO TABS: 500.0000 mg | ORAL_TABLET | Freq: Two times a day (BID) | ORAL | Status: DC

## 2012-09-09 NOTE — ED Notes (Addendum)
Presents with right elbow pain that began 2 weeks ago after sliding into a pole while playing outside with child, states he is unable to lift now with right  Arm. Also c/o lower right sided back pain that began this afternoon, pain does not radiate. Denies back injury. Pain in back is described as "someone punching me. Its an aching type pain" CMS intact. Movement makes pain worse. Denies urinary symptoms. Pt states, it feels similar to back pain I have had in past.

## 2012-09-09 NOTE — ED Provider Notes (Signed)
History  This chart was scribed for Felicie Morn, nurse practitioner working with Gilda Crease, by Bennett Scrape, ED Scribe. This patient was seen in room TR10C/TR10C and the patient's care was started at 10:31 PM.  CSN: [id number]  Arrival date & time 09/09/12  2126   First MD Initiated Contact with Patient 09/09/12 2231      Chief Complaint  Patient presents with  . Elbow Pain    Patient is a 37 y.o. male presenting with extremity pain. The history is provided by the patient. No language interpreter was used.  Extremity Pain This is a new problem. The current episode started more than 1 week ago. The problem occurs constantly. The problem has not changed since onset.Pertinent negatives include no chest pain and no abdominal pain. The symptoms are aggravated by twisting. Nothing relieves the symptoms. He has tried nothing for the symptoms.    Reginald Campbell is a 37 y.o. male who presents to the Emergency Department complaining of 3 weeks of intermittent right elbow pain described as an ache after he sled into a pole while playing outside. The arm is worse with rotation and he denies prior injuries to the area. He denies taking OTC medications at home to improve symptoms. He also c/o a flare up of chronic back pain on the right side described as an ache. He denies any falls or trauma to the area. He denies having an orthopedist to follow up with. He denies any other symptoms currently. He has a h/o HTN and kidney stones.  PCP is Dr. Concepcion Elk  Past Medical History  Diagnosis Date  . Hypertension   . Back pain   . Kidney stones     History reviewed. No pertinent past surgical history.  Family History  Problem Relation Age of Onset  . Cancer Mother   . Cancer Father   . Diabetes Father   . Hypertension Father     History  Substance Use Topics  . Smoking status: Never Smoker   . Smokeless tobacco: Not on file  . Alcohol Use: No      Review of Systems   Cardiovascular: Negative for chest pain.  Gastrointestinal: Negative for abdominal pain.  Musculoskeletal: Positive for back pain.       Positive for right elbow pain  All other systems reviewed and are negative.    Allergies  Penicillins  Home Medications  No current outpatient prescriptions on file.  Triage Vitals: BP 131/85  Pulse 97  Temp(Src) 97 F (36.1 C) (Oral)  Resp 16  SpO2 99%  Physical Exam  Nursing note and vitals reviewed. Constitutional: He is oriented to person, place, and time. He appears well-developed and well-nourished. No distress.  HENT:  Head: Normocephalic and atraumatic.  Eyes: Conjunctivae and EOM are normal.  Neck: Neck supple. No tracheal deviation present.  Cardiovascular: Normal rate and regular rhythm.   Pulmonary/Chest: Effort normal and breath sounds normal. No respiratory distress.  Abdominal: Soft. There is no tenderness.  Musculoskeletal: Normal range of motion. He exhibits tenderness.  Pain with rotation of the right elbow, Good ROM, no bony tenderness or deformities, tenderness to the right flank muscles, no C, T or L spine tenderness  Neurological: He is alert and oriented to person, place, and time.  Skin: Skin is warm and dry.  Psychiatric: He has a normal mood and affect. His behavior is normal.    ED Course  Procedures (including critical care time)  DIAGNOSTIC STUDIES: Oxygen Saturation is 99%  on room air, normal by my interpretation.    COORDINATION OF CARE: 10:36 PM- Advised pt of radiology results. Discussed discharge plan which includes avoiding heavy lifting or strenuous activity, ice/heat therapies, antiinflammatories and follow up with orthopedist with pt and pt agreed to plan.    Labs Reviewed - No data to display Dg Elbow Complete Right  09/09/2012  *RADIOLOGY REPORT*  Clinical Data: Sledded into pole 3 weeks ago; pain at the right olecranon.  RIGHT ELBOW - COMPLETE 3+ VIEW  Comparison: None.  Findings: There is  no evidence of fracture or dislocation.  The visualized joint spaces are preserved.  No significant joint effusion is identified.  The soft tissues are unremarkable in appearance.  IMPRESSION: No evidence of fracture or dislocation.   Original Report Authenticated By: Tonia Ghent, M.D.      No diagnosis found.    MDM     I personally performed the services described in this documentation, which was scribed in my presence. The recorded information has been reviewed and is accurate.      Jimmye Norman, NP 09/10/12 [phone]

## 2012-09-10 NOTE — ED Provider Notes (Signed)
Medical screening examination/treatment/procedure(s) were performed by non-physician practitioner and as supervising physician I was immediately available for consultation/collaboration.  Jonavan Vanhorn J. Loron Weimer, MD 09/10/12 2259 

## 2012-11-02 ENCOUNTER — Emergency Department (HOSPITAL_COMMUNITY): Payer: Medicaid Other

## 2012-11-02 ENCOUNTER — Encounter (HOSPITAL_COMMUNITY): Payer: Self-pay | Admitting: *Deleted

## 2012-11-02 ENCOUNTER — Emergency Department (HOSPITAL_COMMUNITY)
Admission: EM | Admit: 2012-11-02 | Discharge: 2012-11-02 | Disposition: A | Discharge status: Home / Self Care | Payer: Medicaid Other | Attending: Emergency Medicine | Admitting: Emergency Medicine

## 2012-11-02 DIAGNOSIS — Z8349 Family history of other endocrine, nutritional and metabolic diseases: Secondary | ICD-10-CM | POA: Insufficient documentation

## 2012-11-02 DIAGNOSIS — Y9323 Activity, snow (alpine) (downhill) skiing, snow boarding, sledding, tobogganing and snow tubing: Secondary | ICD-10-CM | POA: Insufficient documentation

## 2012-11-02 DIAGNOSIS — I10 Essential (primary) hypertension: Secondary | ICD-10-CM | POA: Insufficient documentation

## 2012-11-02 DIAGNOSIS — W2209XA Striking against other stationary object, initial encounter: Secondary | ICD-10-CM | POA: Insufficient documentation

## 2012-11-02 DIAGNOSIS — M25529 Pain in unspecified elbow: Secondary | ICD-10-CM | POA: Insufficient documentation

## 2012-11-02 DIAGNOSIS — Z791 Long term (current) use of non-steroidal anti-inflammatories (NSAID): Secondary | ICD-10-CM | POA: Insufficient documentation

## 2012-11-02 DIAGNOSIS — R229 Localized swelling, mass and lump, unspecified: Secondary | ICD-10-CM | POA: Insufficient documentation

## 2012-11-02 DIAGNOSIS — M25521 Pain in right elbow: Secondary | ICD-10-CM

## 2012-11-02 DIAGNOSIS — Z87442 Personal history of urinary calculi: Secondary | ICD-10-CM | POA: Insufficient documentation

## 2012-11-02 DIAGNOSIS — Y929 Unspecified place or not applicable: Secondary | ICD-10-CM | POA: Insufficient documentation

## 2012-11-02 DIAGNOSIS — M549 Dorsalgia, unspecified: Secondary | ICD-10-CM | POA: Insufficient documentation

## 2012-11-02 MED ORDER — MELOXICAM 15 MG PO TABS: 15.0000 mg | ORAL_TABLET | Freq: Every day | ORAL | Status: DC

## 2012-11-02 NOTE — ED Provider Notes (Signed)
History    This chart was scribed for non-physician practitioner working with Reginald Roller, MD by Reginald Campbell, ED Scribe. This patient was seen in room TR11C/TR11C and the patient's care was started at 4:08PM.   CSN: [id number]  Arrival date & time 11/02/12  1535   First MD Initiated Contact with Patient 11/02/12 1608      Chief Complaint  Patient presents with  . Arm Pain    (Consider location/radiation/quality/duration/timing/severity/associated sxs/prior treatment) Patient is a 37 y.o. male presenting with arm pain. The history is provided by the patient. No language interpreter was used.  Arm Pain This is a recurrent problem. The current episode started more than 1 week ago (2 months ago). The problem occurs constantly. The problem has been gradually worsening. Pertinent negatives include no chest pain, no abdominal pain, no headaches and no shortness of breath. The symptoms are aggravated by bending. Nothing relieves the symptoms. Treatments tried: Naprosyn. The treatment provided no relief.    Reginald Campbell is a 37 y.o. male , with a hx of hypertension, umbilical hernia, back pain, kidney stones, and right elbow extremity pain (evaluated on 09/09/12) who presents to the Emergency Department complaining of gradual, progressively worsening, arm pain located at the right elbow, onset two months ago.  Associated symptoms include swelling located at the right elbow. The pt reports he went sledding outdors two months ago, where which he impacted his right elbow upon a pole, generating an immediate, severe, aching, painful sensation. Furthermore, the pt informs his aching elbow pain from the incident has become progressively worse in addition to his losing grip strength within his right hand. Modifying factors include extension of the right elbow which intensifies the arm pain. The pt has taken his previously prescribed Naprosyn, which he informs, does not provide relief. Importantly, the pt informs  that he has not yet followed up with an orthopedist due to losing his referral paperwork from his previous extremity pain evaluation on 09/09/12.   Furthermore, the pt admits his father had a hx of gout located within his ankles bilatearlly.   The pt does not smoke or drink alcohol.      Past Medical History  Diagnosis Date  . Hypertension   . Back pain   . Kidney stones     History reviewed. No pertinent past surgical history.  Family History  Problem Relation Age of Onset  . Cancer Mother   . Cancer Father   . Diabetes Father   . Hypertension Father     History  Substance Use Topics  . Smoking status: Never Smoker   . Smokeless tobacco: Not on file  . Alcohol Use: No      Review of Systems  Respiratory: Negative for shortness of breath.   Cardiovascular: Negative for chest pain.  Gastrointestinal: Negative for abdominal pain.  Musculoskeletal: Positive for arthralgias.  Neurological: Negative for headaches.    Allergies  Penicillins  Home Medications   Current Outpatient Rx  Name  Route  Sig  Dispense  Refill  . naproxen (NAPROSYN) 500 MG tablet   Oral   Take 500 mg by mouth 2 (two) times daily with a meal.           BP 120/73  Pulse 70  Temp(Src) 97.9 F (36.6 C) (Oral)  Resp 16  SpO2 97%  Physical Exam  Nursing note and vitals reviewed. Constitutional: He is oriented to person, place, and time. He appears well-developed and well-nourished. No distress.  HENT:  Head: Normocephalic and atraumatic.  Eyes: EOM are normal.  Neck: Neck supple. No tracheal deviation present.  Cardiovascular: Normal rate, regular rhythm and normal heart sounds.  Exam reveals no gallop and no friction rub.   No murmur heard. Pulmonary/Chest: Effort normal and breath sounds normal. No respiratory distress.  Abdominal: Soft.  Musculoskeletal: Normal range of motion.       Right elbow: Tenderness found.  Right elbow: Tenderness to palpitation on the olecranon  process just superior. No condylar tenderness. Pain with full extension. Minimal swelling detected at the right elbow.   Neurological: He is alert and oriented to person, place, and time.  Skin: Skin is warm and dry.  Psychiatric: He has a normal mood and affect. His behavior is normal.    ED Course  Procedures (including critical care time)  DIAGNOSTIC STUDIES: Oxygen Saturation is 97% on room air, normal by my interpretation.    COORDINATION OF CARE:  5:17 PM- Treatment plan discussed with patient. Pt agrees with treatment.      Labs Reviewed - No data to display  Dg Elbow Complete Right  11/02/2012  *RADIOLOGY REPORT*  Clinical Data: Pain in the  right elbow.  RIGHT ELBOW - COMPLETE 3+ VIEW  Comparison: None.  Findings: There is no evidence of fracture or dislocation.  There is no evidence of arthropathy or other focal bone abnormality. Soft tissues are unremarkable.  IMPRESSION: Negative exam.   Original Report Authenticated By: Signa Kell, M.D.       1. Elbow pain, right       MDM  Patient with chrionic elbow pain after injury. No signs of infection.  Patient .scribewill be given mobic. Sling, f/u with ortho.  Supportive care discussed. The patient appears reasonably screened and/or stabilized for discharge and I doubt any other medical condition or other [facility] requiring further screening, evaluation, or treatment in the ED at this time prior to discharge.  I personally performed the services described in this documentation, which was scribed in my presence. The recorded information has been reviewed and is accurate.          Reginald Captain, PA-C 11/07/12 1544

## 2012-11-02 NOTE — Progress Notes (Signed)
Orthopedic Tech Progress Note Patient Details:  Reginald Campbell [birth year] [id number]  Ortho Devices Type of Ortho Device: Shoulder immobilizer Ortho Device/Splint Location: (R) UE Ortho Device/Splint Interventions: Ordered;Application   Jennye Moccasin 11/02/2012, 5:54 PM

## 2012-11-02 NOTE — ED Notes (Signed)
Pt reports injuring right elbow two months ago and still having pain.

## 2012-11-02 NOTE — ED Notes (Signed)
Patient is alert and orientedx4.  Patient was explained discharge instructions and they understood them with no questions.   

## 2012-11-14 NOTE — ED Provider Notes (Signed)
Medical screening examination/treatment/procedure(s) were performed by non-physician practitioner and as supervising physician I was immediately available for consultation/collaboration.    Natividad Schlosser D Jahden Schara, MD 11/14/12 1045 

## 2013-01-03 ENCOUNTER — Encounter (HOSPITAL_COMMUNITY): Payer: Self-pay

## 2013-01-03 ENCOUNTER — Emergency Department (HOSPITAL_COMMUNITY)
Admission: EM | Admit: 2013-01-03 | Discharge: 2013-01-03 | Disposition: A | Discharge status: Home / Self Care | Payer: Medicaid Other | Source: Home / Self Care | Attending: Emergency Medicine | Admitting: Emergency Medicine

## 2013-01-03 DIAGNOSIS — H60399 Other infective otitis externa, unspecified ear: Secondary | ICD-10-CM

## 2013-01-03 DIAGNOSIS — J309 Allergic rhinitis, unspecified: Secondary | ICD-10-CM

## 2013-01-03 DIAGNOSIS — H6092 Unspecified otitis externa, left ear: Secondary | ICD-10-CM

## 2013-01-03 DIAGNOSIS — H6692 Otitis media, unspecified, left ear: Secondary | ICD-10-CM

## 2013-01-03 MED ORDER — FEXOFENADINE-PSEUDOEPHED ER 60-120 MG PO TB12: 1.0000 | ORAL_TABLET | Freq: Two times a day (BID) | ORAL | Status: DC

## 2013-01-03 MED ORDER — NEOMYCIN-POLYMYXIN-HC 3.5-10000-1 OT SOLN: 3.0000 [drp] | Freq: Four times a day (QID) | OTIC | Status: DC

## 2013-01-03 MED ORDER — FLUTICASONE PROPIONATE 50 MCG/ACT NA SUSP: 2.0000 | Freq: Every day | NASAL | Status: DC

## 2013-01-03 MED ORDER — DOXYCYCLINE HYCLATE 100 MG PO TABS: 100.0000 mg | ORAL_TABLET | Freq: Two times a day (BID) | ORAL | Status: DC

## 2013-01-03 MED ORDER — HYDROCODONE-ACETAMINOPHEN 5-325 MG PO TABS: ORAL_TABLET | ORAL | Status: DC

## 2013-01-03 NOTE — ED Notes (Signed)
C/o pain in left ear since yesterday  & allergies for past couple of weeks. Last year had ear pain w ruptured ear drum /purulent drainage

## 2013-01-03 NOTE — ED Provider Notes (Signed)
Chief Complaint:   Chief Complaint  Patient presents with  . Otalgia    History of Present Illness:   Reginald Campbell is a 37 year old male who presents with a two-day history of left ear pain and clear drainage. The pain is rated as 7/10 in intensity. It hurts to touch the external ear. He denies any fever or chills. He has had some allergy symptoms with nasal congestion, clear rhinorrhea, and sneezing as well as itching of the nose and itchy, watery eyes. He denies any sore throat or cough. He states his hearing is normal in the left ear. He suffered a perforated left TM several months ago. He took an antibiotic for this and it got better.  Review of Systems:  Other than noted above, the patient denies any of the following symptoms: Systemic:  No fevers, chills, sweats, weight loss or gain, fatigue, or tiredness. Eye:  No redness, pain, discharge, itching, blurred vision, or diplopia. ENT:  No headache, nasal congestion, sneezing, itching, epistaxis, ear pain, congestion, decreased hearing, ringing in ears, vertigo, or tinnitus.  No oral lesions, sore throat, pain on swallowing, or hoarseness. Neck:  No mass, tenderness or adenopathy. Lungs:  No coughing, wheezing, or shortness of breath. Skin:  No rash or itching.  PMFSH:  Past medical history, family history, social history, meds, and allergies were reviewed. He is allergic to penicillin.  Physical Exam:   Vital signs:  BP 120/79  Pulse 77  Temp(Src) 98.6 F (37 C) (Oral)  Resp 17  SpO2 100% General:  Alert and oriented.  In no distress.  Skin warm and dry. Eye:  PERRL, full EOMs, lids and conjunctiva normal.   ENT:  There was some yellowish exudate in the ear canal. The canal was swollen and very little the TM was seen. The small part of the TM I could see was red and dull.  Nasal mucosa not congested and without drainage.  Mucous membranes moist, no oral lesions, normal dentition, pharynx clear.  No cranial or facial pain to  palplation. Neck:  Supple, full ROM.  No adenopathy, tenderness or mass.  Thyroid normal. Lungs:  Breath sounds clear and equal bilaterally.  No wheezes, rales or rhonchi. Heart:  Rhythm regular, without extrasystoles.  No gallops or murmers. Skin:  Clear, warm and dry.   Course in Urgent Care Center:   An ear wick was inserted into the ear. The patient was instructed on how to put the drops in and should return if the ear and what needs to be removed after a week.  Assessment:  The primary encounter diagnosis was Otitis externa, left. Diagnoses of Otitis media, left and Allergic rhinitis were also pertinent to this visit.  Since I cannot see the TM well, it's hard to say whether this is a repeat perforation of the TM, chronic draining ear, or a combination of otitis media and otitis externa. I suggested that he followup with his primary care Dr. in about 2 weeks after he's completed the treatment. Since he is allergic to penicillin, he was given doxycycline for the infection.  Plan:   1.  The following meds were prescribed:   Discharge Medication List as of 01/03/2013 11:37 AM    START taking these medications   Details  doxycycline (VIBRA-TABS) 100 MG tablet Take 1 tablet (100 mg total) by mouth 2 (two) times daily., Starting 01/03/2013, Until Discontinued, Normal    fexofenadine-pseudoephedrine (ALLEGRA-D) 60-120 MG per tablet Take 1 tablet by mouth every 12 (twelve)  hours., Starting 01/03/2013, Until Discontinued, Normal    fluticasone (FLONASE) 50 MCG/ACT nasal spray Place 2 sprays into the nose daily., Starting 01/03/2013, Until Discontinued, Normal    HYDROcodone-acetaminophen (NORCO/VICODIN) 5-325 MG per tablet 1 to 2 tabs every 4 to 6 hours as needed for pain., Print    neomycin-polymyxin-hydrocortisone (CORTISPORIN) otic solution Place 3 drops into the left ear 4 (four) times daily., Starting 01/03/2013, Until Discontinued, Normal       2.  The patient was instructed in symptomatic care  and handouts were given. 3.  The patient was told to return if becoming worse in any way, if no better in 3 or 4 days, and given some red flag symptoms such as worsening pain or fever that would indicate earlier return. 4.  Follow up with his primary care physician in 2 weeks.    Reuben Likes, MD 01/03/13 [phone]

## 2013-02-13 ENCOUNTER — Encounter (HOSPITAL_COMMUNITY): Payer: Self-pay | Admitting: *Deleted

## 2013-02-13 ENCOUNTER — Emergency Department (HOSPITAL_COMMUNITY)
Admission: EM | Admit: 2013-02-13 | Discharge: 2013-02-13 | Discharge status: Against Medical Advice | Payer: Medicaid Other | Attending: Emergency Medicine | Admitting: Emergency Medicine

## 2013-02-13 ENCOUNTER — Emergency Department (HOSPITAL_COMMUNITY)
Admission: EM | Admit: 2013-02-13 | Discharge: 2013-02-13 | Disposition: A | Discharge status: Home / Self Care | Payer: Medicaid Other | Attending: Emergency Medicine | Admitting: Emergency Medicine

## 2013-02-13 DIAGNOSIS — Z5320 Procedure and treatment not carried out because of patient's decision for unspecified reasons: Secondary | ICD-10-CM | POA: Insufficient documentation

## 2013-02-13 DIAGNOSIS — I10 Essential (primary) hypertension: Secondary | ICD-10-CM | POA: Insufficient documentation

## 2013-02-13 DIAGNOSIS — H6092 Unspecified otitis externa, left ear: Secondary | ICD-10-CM

## 2013-02-13 DIAGNOSIS — Z87442 Personal history of urinary calculi: Secondary | ICD-10-CM | POA: Insufficient documentation

## 2013-02-13 DIAGNOSIS — Z8739 Personal history of other diseases of the musculoskeletal system and connective tissue: Secondary | ICD-10-CM | POA: Insufficient documentation

## 2013-02-13 DIAGNOSIS — H60399 Other infective otitis externa, unspecified ear: Secondary | ICD-10-CM | POA: Insufficient documentation

## 2013-02-13 DIAGNOSIS — Z880 Allergy status to penicillin: Secondary | ICD-10-CM | POA: Insufficient documentation

## 2013-02-13 MED ORDER — NEOMYCIN-POLYMYXIN-HC 3.5-10000-1 OT SOLN
3.0000 [drp] | Freq: Four times a day (QID) | OTIC | Status: DC
  Administered 2013-02-13: 3 [drp] via OTIC
  Filled 2013-02-13: qty 10

## 2013-02-13 NOTE — ED Provider Notes (Signed)
   History    CSN: [id number] Arrival date & time 02/13/13  1136  First MD Initiated Contact with Patient 02/13/13 1258     Chief Complaint  Patient presents with  . Otalgia   (Consider location/radiation/quality/duration/timing/severity/associated sxs/prior Treatment) Patient is a 37 y.o. male presenting with ear pain. The history is provided by the patient. No language interpreter was used.  Otalgia Location:  Left Severity:  Moderate Associated symptoms: no congestion, no fever, no neck pain and no sore throat   Associated symptoms comment:  Ear pain x 3 days in left ear. He has a history of persistent, recurrent left ear pain. No drainage, bleeding. No fever.   Past Medical History  Diagnosis Date  . Hypertension   . Back pain   . Kidney stones    History reviewed. No pertinent past surgical history. Family History  Problem Relation Age of Onset  . Cancer Mother   . Cancer Father   . Diabetes Father   . Hypertension Father    History  Substance Use Topics  . Smoking status: Never Smoker   . Smokeless tobacco: Not on file  . Alcohol Use: No    Review of Systems  Constitutional: Negative for fever and chills.  HENT: Positive for ear pain. Negative for congestion, sore throat and neck pain.     Allergies  Penicillins  Home Medications  No current outpatient prescriptions on file. BP 139/73  Pulse 61  Temp(Src) 97.6 F (36.4 C) (Oral)  Resp 18  SpO2 96% Physical Exam  Constitutional: He is oriented to person, place, and time. He appears well-developed and well-nourished.  HENT:  Right Ear: External ear normal.  Left external canal has purulent discharge without bleeding. Visualized TM unremarkable.   Neck: Normal range of motion.  Pulmonary/Chest: Effort normal.  Musculoskeletal: Normal range of motion.  Neurological: He is alert and oriented to person, place, and time.  Skin: Skin is warm and dry.  Psychiatric: He has a normal mood and affect.     ED Course  Procedures (including critical care time) Labs Reviewed - No data to display No results found. No diagnosis found. 1. Otitis externa  MDM  Uncomplicated external ear infection  Arnoldo Hooker, PA-C 02/13/13 1404

## 2013-02-13 NOTE — ED Notes (Signed)
Pt reports having left ear pain for several days, no distress noted at triage.

## 2013-02-13 NOTE — ED Notes (Signed)
Called pt. x3 with no answer.  

## 2013-02-14 NOTE — ED Provider Notes (Signed)
Medical screening examination/treatment/procedure(s) were performed by non-physician practitioner and as supervising physician I was immediately available for consultation/collaboration.   Carleene Cooper III, MD 02/14/13 1021

## 2013-03-24 ENCOUNTER — Encounter (HOSPITAL_COMMUNITY): Payer: Self-pay | Admitting: Emergency Medicine

## 2013-03-24 ENCOUNTER — Emergency Department (HOSPITAL_COMMUNITY)
Admission: EM | Admit: 2013-03-24 | Discharge: 2013-03-24 | Disposition: A | Discharge status: Home / Self Care | Payer: Medicaid Other | Source: Home / Self Care | Attending: Family Medicine | Admitting: Family Medicine

## 2013-03-24 DIAGNOSIS — H60399 Other infective otitis externa, unspecified ear: Secondary | ICD-10-CM

## 2013-03-24 DIAGNOSIS — H6092 Unspecified otitis externa, left ear: Secondary | ICD-10-CM

## 2013-03-24 NOTE — ED Notes (Signed)
C/o left ear pain.  Denies injury to ear.  Patient states no drainage in ear just pain.  Patient states ear does pop and feels as if fluid is in ear.

## 2013-03-24 NOTE — ED Provider Notes (Signed)
CSN: [id number]     Arrival date & time 03/24/13  1129 History   First MD Initiated Contact with Patient 03/24/13 1217     Chief Complaint  Patient presents with  . Otalgia   (Consider location/radiation/quality/duration/timing/severity/associated sxs/prior Treatment) Patient is a 37 y.o. male presenting with ear pain. The history is provided by the patient and a parent.  Otalgia Location:  Left Quality:  Pressure Severity:  Mild Onset quality:  Gradual Duration:  3 days Timing:  Constant Progression:  Unchanged Chronicity:  Recurrent Context: not direct blow, not foreign body in ear and no water in ear   Relieved by:  None tried Worsened by:  Cold air Ineffective treatments:  None tried Associated symptoms: hearing loss   Associated symptoms: no congestion, no ear discharge, no fever, no rhinorrhea and no sore throat     Past Medical History  Diagnosis Date  . Hypertension   . Back pain   . Kidney stones    History reviewed. No pertinent past surgical history. Family History  Problem Relation Age of Onset  . Cancer Mother   . Cancer Father   . Diabetes Father   . Hypertension Father    History  Substance Use Topics  . Smoking status: Never Smoker   . Smokeless tobacco: Not on file  . Alcohol Use: No    Review of Systems  Constitutional: Negative.  Negative for fever.  HENT: Positive for hearing loss and ear pain. Negative for congestion, sore throat, rhinorrhea and ear discharge.     Allergies  Penicillins  Home Medications  No current outpatient prescriptions on file. BP 108/67  Pulse 71  Temp(Src) 98.3 F (36.8 C) (Oral)  Resp 20  SpO2 98% Physical Exam  Nursing note and vitals reviewed. Constitutional: He is oriented to person, place, and time. He appears well-developed and well-nourished.  HENT:  Head: Normocephalic.  Right Ear: Tympanic membrane, external ear and ear canal normal.  Ears:  Nose: Nose normal.  Mouth/Throat: Oropharynx is  clear and moist.  Eyes: Conjunctivae are normal. Pupils are equal, round, and reactive to light.  Neck: Normal range of motion. Neck supple.  Neurological: He is alert and oriented to person, place, and time.  Skin: Skin is warm and dry.    ED Course  Procedures (including critical care time) Labs Review Labs Reviewed - No data to display Imaging Review No results found.  MDM   1. Otitis externa of left ear    Ear wick placed, cortisporin drops instilled.   Linna Hoff, MD 03/24/13 [phone]

## 2013-03-27 ENCOUNTER — Encounter (HOSPITAL_COMMUNITY): Payer: Self-pay | Admitting: Emergency Medicine

## 2013-03-27 ENCOUNTER — Emergency Department (HOSPITAL_COMMUNITY)
Admission: EM | Admit: 2013-03-27 | Discharge: 2013-03-27 | Disposition: A | Discharge status: Home / Self Care | Payer: Medicaid Other | Source: Home / Self Care | Attending: Family Medicine | Admitting: Family Medicine

## 2013-03-27 DIAGNOSIS — H6092 Unspecified otitis externa, left ear: Secondary | ICD-10-CM

## 2013-03-27 DIAGNOSIS — H60399 Other infective otitis externa, unspecified ear: Secondary | ICD-10-CM

## 2013-03-27 NOTE — ED Notes (Signed)
Pt is here for follow up on left ear pain and rash on right leg. Pt states no new symptoms or concerns at this time.

## 2013-03-27 NOTE — ED Provider Notes (Signed)
CSN: [id number]     Arrival date & time 03/27/13  1106 History   First MD Initiated Contact with Patient 03/27/13 1135     Chief Complaint  Patient presents with  . Follow-up    ear wick removal and check on rash of right leg   (Consider location/radiation/quality/duration/timing/severity/associated sxs/prior Treatment) Patient is a 37 y.o. male presenting with plugged ear sensation. The history is provided by the patient.  Ear Fullness This is a new problem. Episode onset: seen 8/26 here for oe left and seems to be improved with care but muffled sound continues. The problem has been rapidly improving.    Past Medical History  Diagnosis Date  . Hypertension   . Back pain   . Kidney stones    History reviewed. No pertinent past surgical history. Family History  Problem Relation Age of Onset  . Cancer Mother   . Cancer Father   . Diabetes Father   . Hypertension Father    History  Substance Use Topics  . Smoking status: Never Smoker   . Smokeless tobacco: Not on file  . Alcohol Use: No    Review of Systems  Constitutional: Negative.   HENT: Positive for hearing loss and ear pain.     Allergies  Penicillins  Home Medications  No current outpatient prescriptions on file. BP 121/85  Pulse 71  Temp(Src) 97.2 F (36.2 C) (Oral)  Resp 17  SpO2 96% Physical Exam  Nursing note and vitals reviewed. Constitutional: He appears well-developed and well-nourished.  HENT:  Head: Normocephalic.  Right Ear: External ear normal.  Left Ear: No drainage, swelling or tenderness. Decreased hearing is noted.  Ears:  Mouth/Throat: Oropharynx is clear and moist.    ED Course  Procedures (including critical care time) Labs Review Labs Reviewed - No data to display Imaging Review No results found.  MDM   1. Otitis externa of left ear    Discussed with dr shoemaker and advises office eval today.   Linna Hoff, MD 03/27/13 [phone]

## 2013-06-29 ENCOUNTER — Emergency Department (HOSPITAL_COMMUNITY)
Admission: EM | Admit: 2013-06-29 | Discharge: 2013-06-29 | Disposition: A | Discharge status: Home / Self Care | Payer: Medicaid Other | Attending: Emergency Medicine | Admitting: Emergency Medicine

## 2013-06-29 ENCOUNTER — Encounter (HOSPITAL_COMMUNITY): Payer: Self-pay | Admitting: Emergency Medicine

## 2013-06-29 ENCOUNTER — Emergency Department (HOSPITAL_COMMUNITY): Payer: Medicaid Other

## 2013-06-29 DIAGNOSIS — Z87442 Personal history of urinary calculi: Secondary | ICD-10-CM | POA: Insufficient documentation

## 2013-06-29 DIAGNOSIS — R5381 Other malaise: Secondary | ICD-10-CM | POA: Insufficient documentation

## 2013-06-29 DIAGNOSIS — J069 Acute upper respiratory infection, unspecified: Secondary | ICD-10-CM

## 2013-06-29 DIAGNOSIS — I10 Essential (primary) hypertension: Secondary | ICD-10-CM | POA: Insufficient documentation

## 2013-06-29 DIAGNOSIS — R112 Nausea with vomiting, unspecified: Secondary | ICD-10-CM | POA: Insufficient documentation

## 2013-06-29 DIAGNOSIS — Z880 Allergy status to penicillin: Secondary | ICD-10-CM | POA: Insufficient documentation

## 2013-06-29 DIAGNOSIS — IMO0001 Reserved for inherently not codable concepts without codable children: Secondary | ICD-10-CM | POA: Insufficient documentation

## 2013-06-29 DIAGNOSIS — R51 Headache: Secondary | ICD-10-CM | POA: Insufficient documentation

## 2013-06-29 DIAGNOSIS — R519 Headache, unspecified: Secondary | ICD-10-CM

## 2013-06-29 LAB — CBC
HCT: 46 % (ref 39.0–52.0)
Hemoglobin: 16.1 g/dL (ref 13.0–17.0)
MCH: 30.7 pg (ref 26.0–34.0)
MCHC: 35 g/dL (ref 30.0–36.0)
MCV: 87.6 fL (ref 78.0–100.0)
RBC: 5.25 MIL/uL (ref 4.22–5.81)

## 2013-06-29 LAB — POCT I-STAT TROPONIN I

## 2013-06-29 LAB — BASIC METABOLIC PANEL
BUN: 12 mg/dL (ref 6–23)
CO2: 26 mEq/L (ref 19–32)
Calcium: 9 mg/dL (ref 8.4–10.5)
Creatinine, Ser: 0.96 mg/dL (ref 0.50–1.35)
GFR calc non Af Amer: >90 mL/min (ref >90)
Glucose, Bld: 100 mg/dL — ABNORMAL HIGH (ref 70–99)

## 2013-06-29 MED ORDER — METOCLOPRAMIDE HCL 5 MG/ML IJ SOLN
10.0000 mg | Freq: Once | INTRAMUSCULAR | Status: AC
  Administered 2013-06-29: 10 mg via INTRAMUSCULAR
  Filled 2013-06-29: qty 2

## 2013-06-29 MED ORDER — HYDROCODONE-HOMATROPINE 5-1.5 MG/5ML PO SYRP: 5.0000 mL | ORAL_SOLUTION | Freq: Four times a day (QID) | ORAL | Status: DC | PRN

## 2013-06-29 MED ORDER — DIPHENHYDRAMINE HCL 50 MG/ML IJ SOLN
25.0000 mg | Freq: Once | INTRAMUSCULAR | Status: AC
  Administered 2013-06-29: 25 mg via INTRAMUSCULAR
  Filled 2013-06-29: qty 1

## 2013-06-29 MED ORDER — ONDANSETRON 8 MG PO TBDP: ORAL_TABLET | ORAL | Status: DC

## 2013-06-29 MED ORDER — KETOROLAC TROMETHAMINE 60 MG/2ML IM SOLN
60.0000 mg | Freq: Once | INTRAMUSCULAR | Status: AC
  Administered 2013-06-29: 60 mg via INTRAMUSCULAR
  Filled 2013-06-29: qty 2

## 2013-06-29 NOTE — ED Provider Notes (Signed)
CSN: [id number]     Arrival date & time 06/29/13  1027 History   First MD Initiated Contact with Patient 06/29/13 1457     Chief Complaint  Patient presents with  . Chest Pain  . Nasal Congestion   (Consider location/radiation/quality/duration/timing/severity/associated sxs/prior Treatment) HPI Comments: PT presents with cough, myalgias, and headache.  States he started feeling bad about 2 days ago with bitemporal throbbing headache.  Headache has gradually become worse since then.  The morning after headache started, he started having myalgias, cough.  Cough has occasionally be productive, but mostly dry.  One time, he coughed up a small amount of blood.  No fevers, no rash, no neck pain.  Has had headaches in the past with illnesses, similar to this, but not quite as bad.  Has taken tylenol without improvement.   Past Medical History  Diagnosis Date  . Hypertension   . Back pain   . Kidney stones    History reviewed. No pertinent past surgical history. Family History  Problem Relation Age of Onset  . Cancer Mother   . Cancer Father   . Diabetes Father   . Hypertension Father    History  Substance Use Topics  . Smoking status: Never Smoker   . Smokeless tobacco: Not on file  . Alcohol Use: No    Review of Systems  Constitutional: Positive for fatigue. Negative for fever, chills and diaphoresis.  HENT: Positive for sore throat. Negative for congestion, rhinorrhea and sneezing.   Eyes: Negative.   Respiratory: Positive for cough. Negative for chest tightness and shortness of breath.   Cardiovascular: Negative for chest pain and leg swelling.  Gastrointestinal: Positive for nausea and vomiting. Negative for abdominal pain, diarrhea and blood in stool.  Genitourinary: Negative for frequency, hematuria, flank pain and difficulty urinating.  Musculoskeletal: Positive for myalgias. Negative for arthralgias and back pain.  Skin: Negative for rash.  Neurological: Positive for  headaches. Negative for dizziness, speech difficulty, weakness and numbness.    Allergies  Penicillins  Home Medications   Current Outpatient Rx  Name  Route  Sig  Dispense  Refill  . acetaminophen (TYLENOL) 500 MG tablet   Oral   Take 1,000 mg by mouth every 6 (six) hours as needed for mild pain.         Marland Kitchen guaiFENesin (ROBITUSSIN) 100 MG/5ML SOLN   Oral   Take 200 mg by mouth every 4 (four) hours as needed for cough or to loosen phlegm.         Marland Kitchen HYDROcodone-homatropine (HYCODAN) 5-1.5 MG/5ML syrup   Oral   Take 5 mLs by mouth every 6 (six) hours as needed for cough.   120 mL   0   . ondansetron (ZOFRAN ODT) 8 MG disintegrating tablet      8mg  ODT q4 hours prn nausea   4 tablet   0    BP 124/78  Pulse 96  Temp(Src) 98.6 F (37 C)  Resp 19  SpO2 98% Physical Exam  Constitutional: He is oriented to person, place, and time. He appears well-developed and well-nourished.  HENT:  Head: Normocephalic and atraumatic.  Mild erythema posterior pharynx, no exudates.  Uvula midline  Eyes: Pupils are equal, round, and reactive to light.  Neck: Normal range of motion. Neck supple.  No neck stiffness, no pain on ROM of neck  Cardiovascular: Normal rate, regular rhythm and normal heart sounds.   Pulmonary/Chest: Effort normal and breath sounds normal. No respiratory distress. He has no  wheezes. He has no rales. He exhibits no tenderness.  Abdominal: Soft. Bowel sounds are normal. There is no tenderness. There is no rebound and no guarding.  Musculoskeletal: Normal range of motion. He exhibits no edema.  Lymphadenopathy:    He has no cervical adenopathy.  Neurological: He is alert and oriented to person, place, and time.  Skin: Skin is warm and dry. No rash noted.  Psychiatric: He has a normal mood and affect.    ED Course  Procedures (including critical care time) Labs Review Labs Reviewed  CBC - Abnormal; Notable for the following:    Platelets 149 (*)    All other  components within normal limits  BASIC METABOLIC PANEL - Abnormal; Notable for the following:    Glucose, Bld 100 (*)    All other components within normal limits  RAPID STREP SCREEN  CULTURE, GROUP A STREP  POCT I-STAT TROPONIN I   Imaging Review Dg Chest 2 View  06/29/2013   CLINICAL DATA:  Cough and congestion  EXAM: CHEST  2 VIEW  COMPARISON:  October 16, 2009  FINDINGS: Lungs are clear. Heart size and pulmonary vascularity are normal. No adenopathy. There is an old healed fracture of the right clavicle.  IMPRESSION: No edema or consolidation.   Electronically Signed   By: Bretta Bang M.D.   On: 06/29/2013 11:08    EKG Interpretation    Date/Time:  Monday June 29 2013 10:32:00 EST Ventricular Rate:  97 PR Interval:  148 QRS Duration: 96 QT Interval:  354 QTC Calculation: 449 R Axis:   84 Text Interpretation:  Normal sinus rhythm Normal ECG No significant change since last tracing Confirmed by SHELDON  MD, CHARLES ([id number]) on 06/29/2013 10:38:24 AM Also confirmed by Milo Schreier  MD, Avabella Wailes ([id number])  on 06/29/2013 3:01:52 PM            MDM   1. URI (upper respiratory infection)   2. Headache    PT with cough, myalgias, headache.  Feels much better after migraine cocktail.  No symptoms worrisome for SAH, meningitis.  Advised in symptomatic care.  Return if symptoms worsen.    Rolan Bucco, MD 06/29/13 [phone]

## 2013-06-29 NOTE — ED Notes (Signed)
Cough middle  cp and congestion  X 2 days  Had some blood in sputum and had sorethroat

## 2013-07-01 LAB — CULTURE, GROUP A STREP

## 2013-07-04 ENCOUNTER — Emergency Department (HOSPITAL_COMMUNITY)
Admission: EM | Admit: 2013-07-04 | Discharge: 2013-07-04 | Disposition: A | Discharge status: Home / Self Care | Payer: Medicaid Other | Attending: Emergency Medicine | Admitting: Emergency Medicine

## 2013-07-04 ENCOUNTER — Encounter (HOSPITAL_COMMUNITY): Payer: Self-pay | Admitting: Emergency Medicine

## 2013-07-04 DIAGNOSIS — I10 Essential (primary) hypertension: Secondary | ICD-10-CM | POA: Insufficient documentation

## 2013-07-04 DIAGNOSIS — R51 Headache: Secondary | ICD-10-CM | POA: Insufficient documentation

## 2013-07-04 DIAGNOSIS — Z87442 Personal history of urinary calculi: Secondary | ICD-10-CM | POA: Insufficient documentation

## 2013-07-04 DIAGNOSIS — R059 Cough, unspecified: Secondary | ICD-10-CM | POA: Insufficient documentation

## 2013-07-04 DIAGNOSIS — R1110 Vomiting, unspecified: Secondary | ICD-10-CM | POA: Insufficient documentation

## 2013-07-04 DIAGNOSIS — J3489 Other specified disorders of nose and nasal sinuses: Secondary | ICD-10-CM | POA: Insufficient documentation

## 2013-07-04 DIAGNOSIS — Z880 Allergy status to penicillin: Secondary | ICD-10-CM | POA: Insufficient documentation

## 2013-07-04 DIAGNOSIS — R05 Cough: Secondary | ICD-10-CM | POA: Insufficient documentation

## 2013-07-04 MED ORDER — HYDROCODONE-ACETAMINOPHEN 7.5-325 MG/15ML PO SOLN
10.0000 mL | Freq: Once | ORAL | Status: AC
  Administered 2013-07-04: 10 mL via ORAL
  Filled 2013-07-04: qty 15

## 2013-07-04 MED ORDER — ONDANSETRON HCL 4 MG PO TABS
4.0000 mg | ORAL_TABLET | Freq: Once | ORAL | Status: AC
  Administered 2013-07-04: 4 mg via ORAL
  Filled 2013-07-04: qty 1

## 2013-07-04 MED ORDER — HYDROCODONE-ACETAMINOPHEN 5-325 MG PO TABS: 1.0000 | ORAL_TABLET | Freq: Four times a day (QID) | ORAL | Status: DC | PRN

## 2013-07-04 NOTE — ED Notes (Signed)
Pt reports developing a cough about 1 week ago, which causes chest pain when he coughs. Pt is also reporting SOB.

## 2013-07-04 NOTE — ED Notes (Signed)
MD at bedside.EDP at bed side. 

## 2013-07-04 NOTE — ED Notes (Signed)
MD at bedside.EDP 

## 2013-07-04 NOTE — ED Provider Notes (Signed)
CSN: [id number]     Arrival date & time 07/04/13  7846 History   First MD Initiated Contact with Patient 07/04/13 [phone]     Chief Complaint  Patient presents with  . Cough   (Consider location/radiation/quality/duration/timing/severity/associated sxs/prior Treatment) HPI Comments: Mr. Deguire is a 37 year old male with a PMH of HTN who presents with a 1 week history of cough and headache.  The headache is in the temples and he rates the headache 6/10 and says it feels like previous migraines but worse.  The headache increases to 10/10 with cough.  He denies change in vision, neck discomfort or photophobia.  His cough is mostly dry but at times productive of whitish mucous.  He denies hemoptysis.  Associated symptoms include rhinorrhea, nasal congestion and throat irritation.  Tussin and Delsym have not helped much.  He was seen in the ED on 12/01 for the same symptoms.  He received a migraine cocktail which helped his headache.  CXR, rapid strep and throat culture were negative.  He was unable to afford the prescriptions for Hycodan.     Patient is a 37 y.o. male presenting with cough.  Cough Associated symptoms: headaches and rhinorrhea   Associated symptoms: no chest pain, no chills, no fever, no myalgias and no shortness of breath     Past Medical History  Diagnosis Date  . Hypertension   . Back pain   . Kidney stones    History reviewed. No pertinent past surgical history. Family History  Problem Relation Age of Onset  . Cancer Mother   . Cancer Father   . Diabetes Father   . Hypertension Father    History  Substance Use Topics  . Smoking status: Never Smoker   . Smokeless tobacco: Not on file  . Alcohol Use: No    Review of Systems  Constitutional: Negative for fever, chills and appetite change.  HENT: Positive for congestion and rhinorrhea.   Eyes: Negative for photophobia and visual disturbance.  Respiratory: Positive for cough. Negative for shortness of breath.    Cardiovascular: Negative for chest pain and leg swelling.  Gastrointestinal: Positive for vomiting. Negative for nausea, abdominal pain, diarrhea and constipation.  Genitourinary: Negative for dysuria.  Musculoskeletal: Negative for myalgias.  Neurological: Positive for headaches.    Allergies  Penicillins  Home Medications   Current Outpatient Rx  Name  Route  Sig  Dispense  Refill  . acetaminophen (TYLENOL) 500 MG tablet   Oral   Take 1,000 mg by mouth every 6 (six) hours as needed for mild pain.         Marland Kitchen guaiFENesin (ROBITUSSIN) 100 MG/5ML SOLN   Oral   Take 200 mg by mouth every 4 (four) hours as needed for cough or to loosen phlegm.         Marland Kitchen HYDROcodone-homatropine (HYCODAN) 5-1.5 MG/5ML syrup   Oral   Take 5 mLs by mouth every 6 (six) hours as needed for cough.   120 mL   0   . ondansetron (ZOFRAN ODT) 8 MG disintegrating tablet      8mg  ODT q4 hours prn nausea   4 tablet   0    BP 129/82  Pulse 95  Temp(Src) 98.8 F (37.1 C) (Oral)  Resp 24  SpO2 97% Physical Exam  Constitutional: He is oriented to person, place, and time. He appears well-developed. No distress.  HENT:  Head: Normocephalic and atraumatic.  Mouth/Throat: Oropharynx is clear and moist. No oropharyngeal exudate.  Mild pharyngeal erythema  Eyes: EOM are normal. Pupils are equal, round, and reactive to light. Right eye exhibits no discharge. Left eye exhibits no discharge. No scleral icterus.  Neck: Normal range of motion. Neck supple.  Cardiovascular: Normal rate, regular rhythm and normal heart sounds.   Pulmonary/Chest: Effort normal and breath sounds normal. No respiratory distress. He has no wheezes. He has no rales.  + coughing throughout exam  Abdominal: Soft. Bowel sounds are normal. He exhibits no distension. There is no tenderness.  Musculoskeletal: Normal range of motion. He exhibits no edema and no tenderness.  Lymphadenopathy:    He has no cervical adenopathy.   Neurological: He is alert and oriented to person, place, and time. No cranial nerve deficit.  Skin: Skin is warm. He is not diaphoretic.  Psychiatric: He has a normal mood and affect. His behavior is normal.    ED Course  Procedures (including critical care time) Labs Review Labs Reviewed - No data to display Imaging Review No results found.  EKG Interpretation   None       MDM  37 year old male with symptoms of URI.  No concern for meningitis given no fever, nuchal rigidity or mental status change.  CXR, rapid strep and throat culture were negative on 12/01 ED visit.  His labs were within normal limits as well.  He was prescribed Hycodan and Zofran.  The Hycodan was not filled by the patient since it is not covered by his insurance (this was confirmed by the pharmacy).  Today's presentation is consistent with URI.  The patient has taken Tussin with little relief and was unable to afford the Hycodan syrup.  Since Medicaid will not cover Hycodan syrup or similar syrups will prescribe Vicodin and advise patient to take with Tussin.  He is stable for discharge home.      Evelena Peat, DO 07/04/13 1353

## 2013-07-05 NOTE — ED Provider Notes (Signed)
I saw and evaluated the patient, reviewed the resident's note and I agree with the findings and plan.   .Face to face Exam:  General:  Awake HEENT:  Atraumatic Resp:  Normal effort Abd:  Nondistended Neuro:No focal weakness  Jhade Berko L Erlene Devita, MD 07/05/13 0815 

## 2014-08-04 ENCOUNTER — Encounter (HOSPITAL_COMMUNITY): Payer: Self-pay | Admitting: *Deleted

## 2014-08-04 ENCOUNTER — Emergency Department (HOSPITAL_COMMUNITY)
Admission: EM | Admit: 2014-08-04 | Discharge: 2014-08-04 | Disposition: A | Discharge status: Home / Self Care | Payer: Medicaid Other | Attending: Emergency Medicine | Admitting: Emergency Medicine

## 2014-08-04 DIAGNOSIS — Z87442 Personal history of urinary calculi: Secondary | ICD-10-CM | POA: Insufficient documentation

## 2014-08-04 DIAGNOSIS — E669 Obesity, unspecified: Secondary | ICD-10-CM | POA: Insufficient documentation

## 2014-08-04 DIAGNOSIS — M5440 Lumbago with sciatica, unspecified side: Secondary | ICD-10-CM | POA: Insufficient documentation

## 2014-08-04 DIAGNOSIS — M545 Low back pain: Secondary | ICD-10-CM | POA: Diagnosis present

## 2014-08-04 DIAGNOSIS — Z880 Allergy status to penicillin: Secondary | ICD-10-CM | POA: Insufficient documentation

## 2014-08-04 DIAGNOSIS — I10 Essential (primary) hypertension: Secondary | ICD-10-CM | POA: Diagnosis not present

## 2014-08-04 MED ORDER — CYCLOBENZAPRINE HCL 10 MG PO TABS: 10.0000 mg | ORAL_TABLET | Freq: Every day | ORAL | Status: DC

## 2014-08-04 MED ORDER — OXYCODONE-ACETAMINOPHEN 5-325 MG PO TABS
1.0000 | ORAL_TABLET | Freq: Once | ORAL | Status: AC
  Administered 2014-08-04: 1 via ORAL
  Filled 2014-08-04: qty 1

## 2014-08-04 MED ORDER — IBUPROFEN 800 MG PO TABS: 800.0000 mg | ORAL_TABLET | Freq: Three times a day (TID) | ORAL | Status: DC

## 2014-08-04 MED ORDER — OXYCODONE-ACETAMINOPHEN 5-325 MG PO TABS: 1.0000 | ORAL_TABLET | Freq: Four times a day (QID) | ORAL | Status: DC | PRN

## 2014-08-04 MED ORDER — IBUPROFEN 400 MG PO TABS
800.0000 mg | ORAL_TABLET | Freq: Once | ORAL | Status: AC
  Administered 2014-08-04: 800 mg via ORAL
  Filled 2014-08-04: qty 2

## 2014-08-04 NOTE — ED Notes (Signed)
Pt in c/o lower back pain that radiates down his right leg, unknown injury, worse with movement

## 2014-08-04 NOTE — Discharge Instructions (Signed)
Call for a follow up appointment with a Family or Primary Care Provider.  Return if Symptoms worsen.   Take medication as prescribed.  Take your wallet out of your back pocket. Do not take additional OTC tylenol or NSAIDs with these medications.

## 2014-08-04 NOTE — ED Notes (Signed)
Declined W/C at D/C and was escorted to lobby by RN. 

## 2014-08-04 NOTE — ED Provider Notes (Signed)
CSN: [id number]     Arrival date & time 08/04/14  6314 History  This chart was scribed for non-physician practitioner working with Debby Freiberg, MD by Mercy Moore, ED Scribe. This patient was seen in room TR05C/TR05C and the patient's care was started at 8:16 PM.   Chief Complaint  Patient presents with  . Back Pain    HPI Comments: Reginald Campbell is a 39 y.o. male with PMHx of Hypertension, back pain and kidney stones who presents to the Emergency Department complaining of progressively worsening right lower back pain with radiation laterally down his right leg, onset yesterday. Patient denies recent injury or fall. 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 Patient shares that he's always had back stiffness, but he's never had pain of this quality. Patient reports radiation into his right leg and into his right shoulder with applied pressure to his right lower back. Patient reports treatment with OTC medication last night and this morning at 0800, but nothing since. PT keeps wallet in right back pocket. Patient's PCP is at [facility]. Patient states he is looking for another physician.    The history is provided by the patient. No language interpreter was used.    Past Medical History  Diagnosis Date  . Hypertension   . Back pain   . Kidney stones    History reviewed. No pertinent past surgical history. Family History  Problem Relation Age of Onset  . Cancer Mother   . Cancer Father   . Diabetes Father   . Hypertension Father    History  Substance Use Topics  . Smoking status: Never Smoker   . Smokeless tobacco: Not on file  . Alcohol Use: No    Review of Systems  Constitutional: Negative for fever and chills.  Musculoskeletal: Positive for back pain.  Neurological: Negative for weakness and numbness.   Allergies  Penicillins  Home Medications   Prior to Admission medications   Medication Sig Start Date End Date  Taking? Authorizing Provider  guaiFENesin (ROBITUSSIN) 100 MG/5ML SOLN Take 200 mg by mouth every 4 (four) hours as needed for cough or to loosen phlegm.    Historical Provider, MD  HYDROcodone-acetaminophen (NORCO/VICODIN) 5-325 MG per tablet Take 1 tablet by mouth every 6 (six) hours as needed for moderate pain. 07/04/13   Francesca Oman, DO  ondansetron (ZOFRAN ODT) 8 MG disintegrating tablet 8mg  ODT q4 hours prn nausea 06/29/13   Malvin Johns, MD   Triage Vitals: BP 124/82 mmHg  Pulse 78  Temp(Src) 97.7 F (36.5 C) (Oral)  Resp 80  SpO2 97% Physical Exam  Constitutional: He is oriented to person, place, and time. He appears well-developed and well-nourished. No distress.  Obese male  HENT:  Head: Normocephalic and atraumatic.  Eyes: EOM are normal.  Neck: Neck supple.  Pulmonary/Chest: Effort normal. No respiratory distress.  Musculoskeletal: Normal range of motion.       Lumbar back: He exhibits tenderness.       Back:  Discomfort reproducible with palpation. Normal sensation and strength in lower extremities. Liming gait. Able to ambulate unassisted.  Neurological: He is alert and oriented to person, place, and time.  Skin: Skin is warm and dry.  Psychiatric: He has a normal mood and affect. His behavior is normal.  Nursing note and vitals reviewed.   ED Course  Procedures (including critical care time)  COORDINATION OF CARE: 8:22 PM- Discussed treatment plan with patient at bedside  and patient agreed to plan.   Labs Review Labs Reviewed - No data to display  Imaging Review No results found.   EKG Interpretation None      MDM   Final diagnoses:  Right low back pain, with sciatica presence unspecified   Patient with back pain.  No neurological deficits and normal neuro exam.  Patient can walk but states is painful.  No loss of bowel or bladder control.  No concern for cauda equina.  No fever, night sweats, weight loss, h/o cancer, IVDU.  RICE protocol and pain  medicine indicated and discussed with patient.  Meds given in ED:  Medications  oxyCODONE-acetaminophen (PERCOCET/ROXICET) 5-325 MG per tablet 1 tablet (1 tablet Oral Given 08/04/14 2014)  ibuprofen (ADVIL,MOTRIN) tablet 800 mg (800 mg Oral Given 08/04/14 2039)  oxyCODONE-acetaminophen (PERCOCET/ROXICET) 5-325 MG per tablet 1 tablet (1 tablet Oral Given 08/04/14 2040)    Discharge Medication List as of 08/04/2014  8:29 PM    START taking these medications   Details  cyclobenzaprine (FLEXERIL) 10 MG tablet Take 1 tablet (10 mg total) by mouth at bedtime., Starting 08/04/2014, Until Discontinued, Print    ibuprofen (ADVIL,MOTRIN) 800 MG tablet Take 1 tablet (800 mg total) by mouth 3 (three) times daily., Starting 08/04/2014, Until Discontinued, Print    oxyCODONE-acetaminophen (PERCOCET/ROXICET) 5-325 MG per tablet Take 1 tablet by mouth every 6 (six) hours as needed for moderate pain or severe pain., Starting 08/04/2014, Until Discontinued, Print       I personally performed the services described in this documentation, which was scribed in my presence. The recorded information has been reviewed and is accurate.    Harvie Heck, PA-C 08/04/14 2209  Debby Freiberg, MD 08/06/14 [phone]

## 2014-08-04 NOTE — ED Notes (Signed)
Pt has hx of chronic pain and took lyrica 75mg  @ 1600 today ,hydrocodon 7.5/325mg  i tab at 1200. Pt reports pain is 10/10  To Rt hip and rt leg.

## 2015-02-10 ENCOUNTER — Emergency Department (HOSPITAL_COMMUNITY)
Admission: EM | Admit: 2015-02-10 | Discharge: 2015-02-10 | Disposition: A | Discharge status: Home / Self Care | Payer: Medicaid Other | Attending: Emergency Medicine | Admitting: Emergency Medicine

## 2015-02-10 ENCOUNTER — Encounter (HOSPITAL_COMMUNITY): Payer: Self-pay | Admitting: Neurology

## 2015-02-10 DIAGNOSIS — I10 Essential (primary) hypertension: Secondary | ICD-10-CM | POA: Insufficient documentation

## 2015-02-10 DIAGNOSIS — Z880 Allergy status to penicillin: Secondary | ICD-10-CM | POA: Insufficient documentation

## 2015-02-10 DIAGNOSIS — Z87442 Personal history of urinary calculi: Secondary | ICD-10-CM | POA: Insufficient documentation

## 2015-02-10 DIAGNOSIS — Z791 Long term (current) use of non-steroidal anti-inflammatories (NSAID): Secondary | ICD-10-CM | POA: Insufficient documentation

## 2015-02-10 DIAGNOSIS — H6092 Unspecified otitis externa, left ear: Secondary | ICD-10-CM | POA: Diagnosis not present

## 2015-02-10 DIAGNOSIS — H9202 Otalgia, left ear: Secondary | ICD-10-CM | POA: Diagnosis present

## 2015-02-10 MED ORDER — NEOMYCIN-POLYMYXIN-HC 3.5-10000-1 OT SUSP
3.0000 [drp] | Freq: Four times a day (QID) | OTIC | Status: DC
  Administered 2015-02-10: 3 [drp] via OTIC
  Filled 2015-02-10: qty 10

## 2015-02-10 MED ORDER — NEOMYCIN-COLIST-HC-THONZONIUM 3.3-3-10-0.5 MG/ML OT SUSP
3.0000 [drp] | Freq: Four times a day (QID) | OTIC | Status: DC
  Filled 2015-02-10: qty 5

## 2015-02-10 NOTE — ED Notes (Signed)
Pt reporting left ear is stopped up since he got back from the beach.

## 2015-02-10 NOTE — Discharge Instructions (Signed)
Otitis Externa Otitis externa is a bacterial or fungal infection of the outer ear canal. This is the area from the eardrum to the outside of the ear. Otitis externa is sometimes called "swimmer's ear." CAUSES  Possible causes of infection include:  Swimming in dirty water.  Moisture remaining in the ear after swimming or bathing.  Mild injury (trauma) to the ear.  Objects stuck in the ear (foreign body).  Cuts or scrapes (abrasions) on the outside of the ear. SIGNS AND SYMPTOMS  The first symptom of infection is often itching in the ear canal. Later signs and symptoms may include swelling and redness of the ear canal, ear pain, and yellowish-white fluid (pus) coming from the ear. The ear pain may be worse when pulling on the earlobe. DIAGNOSIS  Your health care provider will perform a physical exam. A sample of fluid may be taken from the ear and examined for bacteria or fungi. TREATMENT  Antibiotic ear drops are often given for 10 to 14 days. Treatment may also include pain medicine or corticosteroids to reduce itching and swelling. HOME CARE INSTRUCTIONS   Apply antibiotic ear drops to the ear canal as prescribed by your health care provider.  Take medicines only as directed by your health care provider.  If you have diabetes, follow any additional treatment instructions from your health care provider.  Keep all follow-up visits as directed by your health care provider. PREVENTION   Keep your ear dry. Use the corner of a towel to absorb water out of the ear canal after swimming or bathing.  Avoid scratching or putting objects inside your ear. This can damage the ear canal or remove the protective wax that lines the canal. This makes it easier for bacteria and fungi to grow.  Avoid swimming in lakes, polluted water, or poorly chlorinated pools.  You may use ear drops made of rubbing alcohol and vinegar after swimming. Combine equal parts of white vinegar and alcohol in a bottle.  Put 3 or 4 drops into each ear after swimming. SEEK MEDICAL CARE IF:   You have a fever.  Your ear is still red, swollen, painful, or draining pus after 3 days.  Your redness, swelling, or pain gets worse.  You have a severe headache.  You have redness, swelling, pain, or tenderness in the area behind your ear. MAKE SURE YOU:   Understand these instructions.  Will watch your condition.  Will get help right away if you are not doing well or get worse. Document Released: 07/16/2005 Document Revised: 11/30/2013 Document Reviewed: 08/02/2011 [facility] Patient Information 2015 [place], [place]. This information is not intended to replace advice given to you by your health care provider. Make sure you discuss any questions you have with your health care provider.  Apply 3-4 drops of the antibiotic drop in your left ear every 6 hours for the next 7 days.  Lay with that ear up for 5 minutes allowing the medicine to absorb.
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
## 2015-03-15 ENCOUNTER — Emergency Department (HOSPITAL_COMMUNITY)
Admission: EM | Admit: 2015-03-15 | Discharge: 2015-03-15 | Disposition: A | Discharge status: Home / Self Care | Payer: Medicaid Other | Attending: Emergency Medicine | Admitting: Emergency Medicine

## 2015-03-15 ENCOUNTER — Encounter (HOSPITAL_COMMUNITY): Payer: Self-pay | Admitting: *Deleted

## 2015-03-15 DIAGNOSIS — I10 Essential (primary) hypertension: Secondary | ICD-10-CM | POA: Insufficient documentation

## 2015-03-15 DIAGNOSIS — Z79899 Other long term (current) drug therapy: Secondary | ICD-10-CM | POA: Insufficient documentation

## 2015-03-15 DIAGNOSIS — R21 Rash and other nonspecific skin eruption: Secondary | ICD-10-CM | POA: Insufficient documentation

## 2015-03-15 DIAGNOSIS — Z87442 Personal history of urinary calculi: Secondary | ICD-10-CM | POA: Diagnosis not present

## 2015-03-15 DIAGNOSIS — Z880 Allergy status to penicillin: Secondary | ICD-10-CM | POA: Diagnosis not present

## 2015-03-15 MED ORDER — PREDNISONE 20 MG PO TABS: 40.0000 mg | ORAL_TABLET | Freq: Every day | ORAL | Status: DC

## 2015-03-15 MED ORDER — HYDROXYZINE HCL 25 MG PO TABS: 25.0000 mg | ORAL_TABLET | Freq: Four times a day (QID) | ORAL | Status: DC

## 2015-03-15 NOTE — ED Notes (Signed)
PT reports rash worse on Monday night . Pt first saw out break on Sunday. Pt has red rash to bil. Arms and trunk. Pt reports itching to sites.

## 2015-03-15 NOTE — Discharge Instructions (Signed)

## 2015-03-15 NOTE — ED Provider Notes (Signed)
CSN: [id number]     Arrival date & time 03/15/15  0109 History  This chart was scribed for non-physician practitioner, Margarita Mail, PA-C, working with Dorie Rank, MD by Ladene Artist, ED Scribe. This patient was seen in room TR09C/TR09C and the patient's care was started at 9:14 AM.   Chief Complaint  Patient presents with  . Rash   The history is provided by the patient. No language interpreter was used.   HPI Comments: Reginald Campbell is a 39 y.o. male, with a h/o HTN, who presents to the Emergency Department complaining of a generalized pruritic rash onset 2 days ago. He denies changes in lotions, soaps or detergents. He also denies fever, tongue swelling, lip swelling, difficulty breathing. Pt has tried topical creams without significant relief. No sick contacts with similar rash. No h/o DM.  Past Medical History  Diagnosis Date  . Hypertension   . Back pain   . Kidney stones    History reviewed. No pertinent past surgical history. Family History  Problem Relation Age of Onset  . Cancer Mother   . Cancer Father   . Diabetes Father   . Hypertension Father    Social History  Substance Use Topics  . Smoking status: Never Smoker   . Smokeless tobacco: Never Used  . Alcohol Use: No    Review of Systems  Constitutional: Negative for fever.  HENT: Negative for facial swelling.   Respiratory: Negative for shortness of breath.   Skin: Positive for rash.   Allergies  Penicillins  Home Medications   Prior to Admission medications   Medication Sig Start Date End Date Taking? Authorizing Provider  cyclobenzaprine (FLEXERIL) 10 MG tablet Take 1 tablet (10 mg total) by mouth at bedtime. 08/04/14   Harvie Heck, PA-C  guaiFENesin (ROBITUSSIN) 100 MG/5ML SOLN Take 200 mg by mouth every 4 (four) hours as needed for cough or to loosen phlegm.    Historical Provider, MD  HYDROcodone-acetaminophen (NORCO/VICODIN) 5-325 MG per tablet Take 1 tablet by mouth every 6 (six) hours as needed  for moderate pain. 07/04/13   Francesca Oman, DO  ibuprofen (ADVIL,MOTRIN) 800 MG tablet Take 1 tablet (800 mg total) by mouth 3 (three) times daily. 08/04/14   Harvie Heck, PA-C  ondansetron (ZOFRAN ODT) 8 MG disintegrating tablet 8mg  ODT q4 hours prn nausea 06/29/13   Malvin Johns, MD  oxyCODONE-acetaminophen (PERCOCET/ROXICET) 5-325 MG per tablet Take 1 tablet by mouth every 6 (six) hours as needed for moderate pain or severe pain. 08/04/14   Lauren Parker, PA-C   BP 120/83 mmHg  Pulse 63  Temp(Src) 98.5 F (36.9 C) (Oral)  Resp 18  Ht 6' (1.829 m)  SpO2 98% Physical Exam  Constitutional: He is oriented to person, place, and time. He appears well-developed and well-nourished. No distress.  HENT:  Head: Normocephalic and atraumatic.  Eyes: Conjunctivae and EOM are normal.  Neck: Neck supple. No tracheal deviation present.  Cardiovascular: Normal rate.   Pulmonary/Chest: Effort normal. No respiratory distress.  Patent and clear airway without swelling.   Musculoskeletal: Normal range of motion.  Neurological: He is alert and oriented to person, place, and time.  Skin: Skin is warm and dry. Rash noted. Rash is papular.  Multiple singular erythematous papules with central vesicles on some along the abdomen, arms and a few on the feet but now palmar or plantar involvement. No signs of infection.  Psychiatric: He has a normal mood and affect. His behavior is normal.  Nursing note  and vitals reviewed.  ED Course  Procedures (including critical care time) DIAGNOSTIC STUDIES: Oxygen Saturation is 98% on RA, normal by my interpretation.    COORDINATION OF CARE: 9:19 AM-Discussed treatment plan which includes Prednisone and Atarax with pt at bedside and pt agreed to plan.   Labs Review Labs Reviewed - No data to display  Imaging Review No results found. I, Margarita Mail, PA-C, personally reviewed and evaluated these images and lab results as part of my medical decision-making.   EKG  Interpretation None      MDM   Final diagnoses:  None    BP 120/83 mmHg  Pulse 63  Temp(Src) 98.5 F (36.9 C) (Oral)  Resp 18  Ht 6' (1.829 m)  SpO2 98% Patient with nonspecific pruritic skin eruption. We'll treat with prednisone and Atarax. Signs of infection. Follow-up with primary care physician. Signs of systemic allergic reaction or anaphylaxis. No airway involvement. Appears safe for discharge at this time.  I personally performed the services described in this documentation, which was scribed in my presence. The recorded information has been reviewed and is accurate.       Margarita Mail, PA-C 03/15/15 1219  Dorie Rank, MD 03/15/15 1500

## 2015-04-06 ENCOUNTER — Telehealth: Payer: Self-pay | Admitting: Internal Medicine

## 2015-04-06 NOTE — Telephone Encounter (Signed)
Call to patient to confirm appointment for 04/07/15 at 3:15 lmtcb

## 2015-04-07 ENCOUNTER — Encounter: Payer: Self-pay | Admitting: Internal Medicine

## 2015-04-07 ENCOUNTER — Ambulatory Visit (INDEPENDENT_AMBULATORY_CARE_PROVIDER_SITE_OTHER): Payer: Medicaid Other | Admitting: Internal Medicine

## 2015-04-07 VITALS — BP 125/77 | HR 77 | Temp 98.0°F | Ht 72.0 in | Wt 366.5 lb

## 2015-04-07 DIAGNOSIS — G4730 Sleep apnea, unspecified: Secondary | ICD-10-CM | POA: Diagnosis not present

## 2015-04-07 DIAGNOSIS — H538 Other visual disturbances: Secondary | ICD-10-CM | POA: Diagnosis not present

## 2015-04-07 DIAGNOSIS — K625 Hemorrhage of anus and rectum: Secondary | ICD-10-CM | POA: Diagnosis not present

## 2015-04-07 DIAGNOSIS — Z6841 Body Mass Index (BMI) 40.0 and over, adult: Secondary | ICD-10-CM | POA: Diagnosis not present

## 2015-04-07 DIAGNOSIS — G4733 Obstructive sleep apnea (adult) (pediatric): Secondary | ICD-10-CM | POA: Insufficient documentation

## 2015-04-07 DIAGNOSIS — Z0000 Encounter for general adult medical examination without abnormal findings: Secondary | ICD-10-CM

## 2015-04-07 DIAGNOSIS — E669 Obesity, unspecified: Secondary | ICD-10-CM

## 2015-04-07 DIAGNOSIS — Z833 Family history of diabetes mellitus: Secondary | ICD-10-CM

## 2015-04-07 DIAGNOSIS — Z8489 Family history of other specified conditions: Secondary | ICD-10-CM | POA: Diagnosis not present

## 2015-04-07 HISTORY — DX: Hemorrhage of anus and rectum: K62.5

## 2015-04-07 HISTORY — DX: Family history of diabetes mellitus: Z83.3

## 2015-04-07 LAB — POCT GLYCOSYLATED HEMOGLOBIN (HGB A1C): Hemoglobin A1C: 5.3

## 2015-04-07 LAB — GLUCOSE, CAPILLARY: Glucose-Capillary: 92 mg/dL (ref 65–99)
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
## 2015-04-07 NOTE — Progress Notes (Signed)
Internal Medicine Clinic Attending  I saw and evaluated the patient.  I personally confirmed the key portions of the history and exam documented by Dr. Patel,Vishal and I reviewed pertinent patient test results.  The assessment, diagnosis, and plan were formulated together and I agree with the documentation in the resident's note.  

## 2015-04-07 NOTE — Assessment & Plan Note (Signed)
Patient with family history of DM in father, brother, and other family members. Says he has not been tested for diabetes before. Reports occasional numbness in his fingers. No symptoms of urinary frequency or polydipsia.  -Hgb A1C today - result 5.3

## 2015-04-07 NOTE — Assessment & Plan Note (Signed)
Patient reports increased blurriness in vision over the last year when viewing distant objects. Denies blurriness on close vision. Has never seen eye specialist or wore glasses/contacts.  Eye chart exam today shows 20/30 in right eye and 20/40 in left eye. -Referral to optometry

## 2015-04-07 NOTE — Assessment & Plan Note (Signed)
Patient with morbid obesity, large abdomen who has been told he is a heavy snorer and noticed to stop breathing during sleep. He endorses feeling fatigued and tired throughout the day. Never had sleep study before. Has family history of OSA in brother, mother, and sister.  -Referral for sleep study

## 2015-04-07 NOTE — Assessment & Plan Note (Signed)
Refused flu shot today, says previous times he got sick. Has not taken last 2 years and has not gotten sick.

## 2015-04-07 NOTE — Patient Instructions (Signed)
It was a pleasure to meet you Reginald Campbell.  We are checking a couple blood tests called CBC (to see your regular blood level) and Hemoglobin A1C (to check for diabetes).   I will refer you for a sleep study (for sleep apnea)  and to an optometrist (for your vision).  Please let us know if you should see any other bright red blood in your stool.  Please follow up with [place] in 3 months.

## 2015-04-07 NOTE — Assessment & Plan Note (Signed)
Patient reports one episode of some bright red drops of blood per rectum about 3 weeks ago. He reports history of hemorrhoids and states he was constipated at that time and was straining. He denies seeing any blood mixed in the stool or melena. Denies any recent diarrhea, constipation, abdominal pain, lightheadedness/dizziness, or other episodes of blood seen in stool or toilet.  BRBPR likely 2/2 to internal hemorrhoids, other differential includes diverticulosis. Patient reports history of cancer in paternal aunt and uncle but does not know of any history of colon cancer.  -CBC today -Patient advised to inform us of any other episode of blood in stool or dark/black stool

## 2015-04-08 LAB — CBC
HEMATOCRIT: 44.5 % (ref 37.5–51.0)
HEMOGLOBIN: 15 g/dL (ref 12.6–17.7)
MCH: 29.4 pg (ref 26.6–33.0)
MCHC: 33.7 g/dL (ref 31.5–35.7)
MCV: 87 fL (ref 79–97)
Platelets: 165 10*3/uL (ref 150–379)
RBC: 5.11 x10E6/uL (ref 4.14–5.80)
RDW: 14.6 % (ref 12.3–15.4)
WBC: 8.2 10*3/uL (ref 3.4–10.8)

## 2015-04-14 NOTE — Addendum Note (Signed)
Addended by: Sander Nephew F on: 04/14/2015 08:54 AM   Modules accepted: Orders

## 2015-04-18 ENCOUNTER — Ambulatory Visit (INDEPENDENT_AMBULATORY_CARE_PROVIDER_SITE_OTHER): Payer: Medicaid Other | Admitting: Internal Medicine

## 2015-04-18 ENCOUNTER — Encounter: Payer: Self-pay | Admitting: Internal Medicine

## 2015-04-18 VITALS — BP 134/90 | HR 71 | Temp 97.5°F | Ht 72.0 in | Wt 369.4 lb

## 2015-04-18 DIAGNOSIS — N200 Calculus of kidney: Secondary | ICD-10-CM

## 2015-04-18 DIAGNOSIS — K625 Hemorrhage of anus and rectum: Secondary | ICD-10-CM | POA: Diagnosis present

## 2015-04-18 HISTORY — DX: Calculus of kidney: N20.0

## 2015-04-18 LAB — POCT URINALYSIS DIPSTICK
Bilirubin, UA: NEGATIVE
Glucose, UA: NEGATIVE
Ketones, UA: NEGATIVE
LEUKOCYTES UA: NEGATIVE
NITRITE UA: NEGATIVE
PH UA: 6
PROTEIN UA: NEGATIVE
Spec Grav, UA: 1.02
UROBILINOGEN UA: 1

## 2015-04-18 MED ORDER — HYDROCODONE-ACETAMINOPHEN 5-325 MG PO TABS: 1.0000 | ORAL_TABLET | Freq: Four times a day (QID) | ORAL | Status: DC | PRN

## 2015-04-18 NOTE — Progress Notes (Signed)
Internal Medicine Clinic Attending  Case discussed with Dr. Richardson soon after the resident saw the patient.  We reviewed the resident's history and exam and pertinent patient test results.  I agree with the assessment, diagnosis, and plan of care documented in the resident's note. 

## 2015-04-18 NOTE — Assessment & Plan Note (Signed)
Urinalysis was negative for signs of infection. Trace hemoglobin. Given concern for ureterolithiasis, although an atypical presentation, we will check a CT renal stone study and give pain control. If positive for stone, patient may need flomax and possible referral to Urology based on the size of the stone.  Plan: -Norco prn -CT Renal Stone Study

## 2015-04-18 NOTE — Patient Instructions (Signed)
TAKE HYDROCODONE FOR YOUR PAIN. WE WILL CHECK YOUR URINE FOR AN INFECTION AND OBTAIN A CT SCAN TO MAKE SURE YOU DO NOT HAVE A KIDNEY STONE. PLEASE FOLLOW UP IN 1-2 WEEKS IF YOU CONTINUE TO HAVE PAIN.

## 2015-04-18 NOTE — Progress Notes (Signed)
   Subjective:    Patient ID: Reginald Campbell, male    DOB: May 08, 1976, 39 y.o.   MRN: [id number]  HPI Reginald Campbell is a 39 yo male without significant past medical history who presents with complaint of 2 days of bilateral flank pain, increased urinary frequency, and increased urinary urgency. He denies suprapubic pain, dysuria, hesitancy, fever, chills, nausea, vomiting or diarrhea. He states pain is in his flank and does not radiate anteriorly or to his groin. It is constant at an 8/10. He admits to previous history of UTI and of nephrolithiasis which was an incidental finding and passed on its own.   Review of Systems  General: Denies fever, chills, fatigue Respiratory: Denies SOB, cough   Cardiovascular: Denies chest pain and palpitations.  Gastrointestinal: Denies nausea, vomiting, abdominal pain, diarrhea, constipation Genitourinary: Admits to flank pain, urgency, frequency. Denies dysuria, hematuria, suprapubic pain. Skin: Denies pallor, rash and wounds.  Neurological: Denies dizziness, headaches, weakness, lightheadedness  Past Medical History  Diagnosis Date  . Hypertension   . Back pain   . Kidney stones    Outpatient Encounter Prescriptions as of 04/18/2015  Medication Sig Note  . HYDROcodone-acetaminophen (NORCO/VICODIN) 5-325 MG per tablet Take 1 tablet by mouth every 6 (six) hours as needed for moderate pain.   . [DISCONTINUED] cyclobenzaprine (FLEXERIL) 10 MG tablet Take 1 tablet (10 mg total) by mouth at bedtime. (Patient not taking: Reported on 04/07/2015)   . [DISCONTINUED] guaiFENesin (ROBITUSSIN) 100 MG/5ML SOLN Take 200 mg by mouth every 4 (four) hours as needed for cough or to loosen phlegm.   . [DISCONTINUED] HYDROcodone-acetaminophen (NORCO/VICODIN) 5-325 MG per tablet Take 1 tablet by mouth every 6 (six) hours as needed for moderate pain. (Patient not taking: Reported on 04/07/2015)   . [DISCONTINUED] hydrOXYzine (ATARAX/VISTARIL) 25 MG tablet Take 1 tablet (25 mg total)  by mouth every 6 (six) hours. (Patient not taking: Reported on 04/07/2015)   . [DISCONTINUED] ibuprofen (ADVIL,MOTRIN) 800 MG tablet Take 1 tablet (800 mg total) by mouth 3 (three) times daily. (Patient not taking: Reported on 04/07/2015)   . [DISCONTINUED] ondansetron (ZOFRAN ODT) 8 MG disintegrating tablet 8mg  ODT q4 hours prn nausea (Patient not taking: Reported on 04/07/2015) 07/04/2013: Has not taken any yet   . [DISCONTINUED] oxyCODONE-acetaminophen (PERCOCET/ROXICET) 5-325 MG per tablet Take 1 tablet by mouth every 6 (six) hours as needed for moderate pain or severe pain. (Patient not taking: Reported on 04/07/2015)   . [DISCONTINUED] predniSONE (DELTASONE) 20 MG tablet Take 2 tablets (40 mg total) by mouth daily. (Patient not taking: Reported on 04/07/2015)    No facility-administered encounter medications on file as of 04/18/2015.       Objective:   Physical Exam Filed Vitals:   04/18/15 1520  BP: 134/90  Pulse: 71  Temp: 97.5 F (36.4 C)  TempSrc: Oral  Height: 6' (1.829 m)  Weight: 369 lb 6.4 oz (167.559 kg)  SpO2: 98%   General: Vital signs reviewed.  Patient is obese, in no acute distress and cooperative with exam.  Cardiovascular: RRR, S1 normal, S2 normal Pulmonary/Chest: Clear to auscultation bilaterally, no wheezes, rales, or rhonchi. Abdominal: Soft, non-tender, non-distended, BS + Extremities: No lower extremity edema bilaterally, pulses symmetric and intact bilaterally. Skin: Warm, dry and intact. No rashes or erythema. Psychiatric: Normal mood and affect. speech and behavior is normal. Cognition and memory are normal.      Assessment & Plan:   Please see problem oriented assessment and plan for more information.

## 2015-04-19 LAB — BMP8+ANION GAP
Anion Gap: 16 mmol/L (ref 10.0–18.0)
BUN/Creatinine Ratio: 13 (ref 9–20)
BUN: 11 mg/dL (ref 6–24)
CHLORIDE: 99 mmol/L (ref 97–108)
CO2: 25 mmol/L (ref 18–29)
Calcium: 9.1 mg/dL (ref 8.7–10.2)
Creatinine, Ser: 0.86 mg/dL (ref 0.76–1.27)
GFR calc Af Amer: 125 mL/min/{1.73_m2} (ref >59)
GFR calc non Af Amer: 108 mL/min/{1.73_m2} (ref >59)
GLUCOSE: 101 mg/dL — AB (ref 65–99)
Potassium: 4.6 mmol/L (ref 3.5–5.2)
SODIUM: 140 mmol/L (ref 134–144)

## 2015-04-21 ENCOUNTER — Encounter (HOSPITAL_COMMUNITY): Payer: Self-pay

## 2015-04-21 ENCOUNTER — Ambulatory Visit (HOSPITAL_COMMUNITY)
Admission: RE | Admit: 2015-04-21 | Discharge: 2015-04-21 | Disposition: A | Discharge status: Home / Self Care | Payer: Medicaid Other | Source: Ambulatory Visit | Attending: Internal Medicine | Admitting: Internal Medicine

## 2015-04-21 DIAGNOSIS — N200 Calculus of kidney: Secondary | ICD-10-CM | POA: Insufficient documentation

## 2015-04-21 DIAGNOSIS — R109 Unspecified abdominal pain: Secondary | ICD-10-CM | POA: Insufficient documentation

## 2015-04-21 DIAGNOSIS — K760 Fatty (change of) liver, not elsewhere classified: Secondary | ICD-10-CM | POA: Insufficient documentation

## 2015-04-22 ENCOUNTER — Telehealth: Payer: Self-pay | Admitting: Internal Medicine

## 2015-04-22 NOTE — Telephone Encounter (Signed)
Called patient with CT results. No answer, left message to call back

## 2015-04-25 ENCOUNTER — Telehealth: Payer: Self-pay | Admitting: Internal Medicine

## 2015-04-25 ENCOUNTER — Telehealth: Payer: Self-pay | Admitting: *Deleted

## 2015-04-25 ENCOUNTER — Other Ambulatory Visit: Payer: Self-pay | Admitting: Internal Medicine

## 2015-04-25 DIAGNOSIS — N200 Calculus of kidney: Secondary | ICD-10-CM

## 2015-04-25 MED ORDER — TAMSULOSIN HCL 0.4 MG PO CAPS: 0.4000 mg | ORAL_CAPSULE | Freq: Every day | ORAL | Status: DC

## 2015-04-25 NOTE — Telephone Encounter (Signed)
Called to discuss CT findings. Prescribed Flomax. Patient does not want to see Urology at this time. 2 mm stone should pass on its own. He will call back for appointment if pain does not improve or worsens.

## 2015-04-25 NOTE — Telephone Encounter (Signed)
Called patient back.

## 2015-04-25 NOTE — Telephone Encounter (Signed)
Message per pt - he called back for test results. Thanks

## 2015-07-01 ENCOUNTER — Ambulatory Visit (HOSPITAL_BASED_OUTPATIENT_CLINIC_OR_DEPARTMENT_OTHER): Payer: Medicaid Other | Attending: Internal Medicine

## 2015-07-01 DIAGNOSIS — R0683 Snoring: Secondary | ICD-10-CM | POA: Insufficient documentation

## 2015-07-01 DIAGNOSIS — G4730 Sleep apnea, unspecified: Secondary | ICD-10-CM

## 2015-07-01 DIAGNOSIS — G4733 Obstructive sleep apnea (adult) (pediatric): Secondary | ICD-10-CM | POA: Diagnosis not present

## 2015-07-03 DIAGNOSIS — G4730 Sleep apnea, unspecified: Secondary | ICD-10-CM | POA: Diagnosis not present

## 2015-07-03 NOTE — Progress Notes (Signed)
Patient Name: Reginald Campbell, Gloss Date: 07/01/2015 Gender: Male D.O.B: 08-13-1975 Age (years): 39 Referring Provider: Gilles Chiquito Height (inches): 80 Interpreting Physician: Baird Lyons MD, ABSM Weight (lbs): 366 RPSGT: Joni Reining BMI: 50 MRN: [id number] Neck Size: 21.00 CLINICAL INFORMATION Sleep Study Type: Split Night CPAP Indication for sleep study: OSA Epworth Sleepiness Score: 10  SLEEP STUDY TECHNIQUE As per the AASM Manual for the Scoring of Sleep and Associated Events v2.3 (April 2016) with a hypopnea requiring 4% desaturations. The channels recorded and monitored were frontal, central and occipital EEG, electrooculogram (EOG), submentalis EMG (chin), nasal and oral airflow, thoracic and abdominal wall motion, anterior tibialis EMG, snore microphone, electrocardiogram, and pulse oximetry. Continuous positive airway pressure (CPAP) was initiated when the patient met split night criteria and was titrated according to treat sleep-disordered breathing. MEDICATIONS Medications taken by the patient : charted for review Medications administered by patient during sleep study : No sleep medicine administered.  RESPIRATORY PARAMETERS Diagnostic Total AHI (/hr): 50.5 RDI (/hr): 51.9 OA Index (/hr): 16.7 CA Index (/hr): 0.0 REM AHI (/hr): 86.4 NREM AHI (/hr): 46.5 Supine AHI (/hr): 81.6 Non-supine AHI (/hr): 37.29 Min O2 Sat (%): 66.00 Mean O2 (%): 90.54 Time below 88% (min): 17.0   Titration Optimal Pressure (cm): 14 AHI at Optimal Pressure (/hr): 8.3 Min O2 at Optimal Pressure (%): 88.0 Supine % at Optimal (%): 98 Sleep % at Optimal (%): 90    SLEEP ARCHITECTURE The recording time for the entire night was 404.6 minutes. During a baseline period of 219.7 minutes, the patient slept for 126.0 minutes in REM and nonREM, yielding a sleep efficiency of 57.4%. Sleep onset after lights out was 85.8 minutes with a REM latency of 111.0 minutes. The patient spent 6.35% of the night  in stage N1 sleep, 57.94% in stage N2 sleep, 25.79% in stage N3 and 9.92% in REM. During the titration period of 179.1 minutes, the patient slept for 155.0 minutes in REM and nonREM, yielding a sleep efficiency of 86.6%. Sleep onset after CPAP initiation was 20.8 minutes with a REM latency of 58.0 minutes. The patient spent 3.87% of the night in stage N1 sleep, 67.42% in stage N2 sleep, 10.97% in stage N3 and 17.74% in REM.  CARDIAC DATA The 2 lead EKG demonstrated sinus rhythm. The mean heart rate was 74.45 beats per minute. Other EKG findings include: None.  LEG MOVEMENT DATA The total Periodic Limb Movements of Sleep (PLMS) were 0. The PLMS index was 0.00 .  IMPRESSIONS - Severe obstructive sleep apnea occurred during the diagnostic portion of the study (AHI = 50.5/hour). An optimal PAP pressure was selected for this patient ( 14 cm of water) - No significant central sleep apnea occurred during the diagnostic portion of the study (CAI = 0.0/hour). - Moderate oxygen desaturation was noted during the diagnostic portion of the study (Min O2 =66.00%). - The patient snored with Loud snoring volume during the diagnostic portion of the study. - No cardiac abnormalities were noted during this study. - Clinically significant periodic limb movements did not occur during sleep.  DIAGNOSIS - Obstructive Sleep Apnea (327.23 [G47.33 ICD-10])  RECOMMENDATIONS - Trial of CPAP therapy on 14 cm H2O with a Medium size Fisher&Paykel Full Face Mask Simplus mask and heated humidification. - Avoid alcohol, sedatives and other CNS depressants that may worsen sleep apnea and disrupt normal sleep architecture. - Sleep hygiene should be reviewed to assess factors that may improve sleep quality. - Weight management and regular exercise should be initiated  or continued.  Deneise Lever Diplomate, American Board of Sleep Medicine  ELECTRONICALLY SIGNED ON:  07/03/2015, 11:22 AM [place] PH: [phone]   FX: [phone] [place]

## 2015-08-18 ENCOUNTER — Encounter: Payer: Self-pay | Admitting: Internal Medicine

## 2015-08-18 ENCOUNTER — Ambulatory Visit (INDEPENDENT_AMBULATORY_CARE_PROVIDER_SITE_OTHER): Payer: Medicaid Other | Admitting: Internal Medicine

## 2015-08-18 VITALS — BP 125/74 | HR 75 | Temp 97.8°F | Ht 72.0 in | Wt 369.9 lb

## 2015-08-18 DIAGNOSIS — G4733 Obstructive sleep apnea (adult) (pediatric): Secondary | ICD-10-CM | POA: Diagnosis not present

## 2015-08-18 DIAGNOSIS — M25512 Pain in left shoulder: Secondary | ICD-10-CM | POA: Diagnosis not present

## 2015-08-18 DIAGNOSIS — M25511 Pain in right shoulder: Secondary | ICD-10-CM | POA: Insufficient documentation

## 2015-08-18 MED ORDER — MELOXICAM 7.5 MG PO TABS: 7.5000 mg | ORAL_TABLET | Freq: Every day | ORAL | Status: AC
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
## 2015-08-18 NOTE — Patient Instructions (Signed)
General Instructions:  Please start taking Mobic 7.5 mg daily, if no better after 1 week you can increase to 15mg  daily  Please let me know if you want to try a steroid injection to the shoulder.  Work on the shoulder exercises.  Please bring your medicines with you each time you come to clinic.  Medicines may include prescription medications, over-the-counter medications, herbal remedies, eye drops, vitamins, or other pills.   Progress Toward Treatment Goals:  No flowsheet data found.  Self Care Goals & Plans:  No flowsheet data found.  No flowsheet data found.   Care Management & Community Referrals:  No flowsheet data found.       Shoulder Pain The shoulder is the joint that connects your arms to your body. The bones that form the shoulder joint include the upper arm bone (humerus), the shoulder blade (scapula), and the collarbone (clavicle). The top of the humerus is shaped like a ball and fits into a rather flat socket on the scapula (glenoid cavity). A combination of muscles and strong, fibrous tissues that connect muscles to bones (tendons) support your shoulder joint and hold the ball in the socket. Small, fluid-filled sacs (bursae) are located in different areas of the joint. They act as cushions between the bones and the overlying soft tissues and help reduce friction between the gliding tendons and the bone as you move your arm. Your shoulder joint allows a wide range of motion in your arm. This range of motion allows you to do things like scratch your back or throw a ball. However, this range of motion also makes your shoulder more prone to pain from overuse and injury. Causes of shoulder pain can originate from both injury and overuse and usually can be grouped in the following four categories:  Redness, swelling, and pain (inflammation) of the tendon (tendinitis) or the bursae (bursitis).  Instability, such as a dislocation of the joint.  Inflammation of the joint  (arthritis).  Broken bone (fracture). HOME CARE INSTRUCTIONS   Apply ice to the sore area.  Put ice in a plastic bag.  Place a towel between your skin and the bag.  Leave the ice on for 15-20 minutes, 3-4 times per day for the first 2 days, or as directed by your health care provider.  Stop using cold packs if they do not help with the pain.  If you have a shoulder sling or immobilizer, wear it as long as your caregiver instructs. Only remove it to shower or bathe. Move your arm as little as possible, but keep your hand moving to prevent swelling.  Squeeze a soft ball or foam pad as much as possible to help prevent swelling.  Only take over-the-counter or prescription medicines for pain, discomfort, or fever as directed by your caregiver. SEEK MEDICAL CARE IF:   Your shoulder pain increases, or new pain develops in your arm, hand, or fingers.  Your hand or fingers become cold and numb.  Your pain is not relieved with medicines. SEEK IMMEDIATE MEDICAL CARE IF:   Your arm, hand, or fingers are numb or tingling.  Your arm, hand, or fingers are significantly swollen or turn white or blue. MAKE SURE YOU:   Understand these instructions.  Will watch your condition.  Will get help right away if you are not doing well or get worse.   This information is not intended to replace advice given to you by your health care provider. Make sure you discuss any questions you have  with your health care provider.   Document Released: 04/25/2005 Document Revised: 08/06/2014 Document Reviewed: 11/08/2014 Elsevier Interactive Patient Education [employer].

## 2015-08-19 NOTE — Assessment & Plan Note (Signed)
HPI: He has had a recent sleep study which should OSA.  A:OSA  P: ordered CPAP - Encouraged weight loss
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
## 2015-08-22 NOTE — Progress Notes (Signed)
Internal Medicine Clinic Attending  Case discussed with Dr. Hoffman soon after the resident saw the patient.  We reviewed the resident's history and exam and pertinent patient test results.  I agree with the assessment, diagnosis, and plan of care documented in the resident's note. 

## 2015-09-20 NOTE — Addendum Note (Signed)
Addended by: Lucious Groves on: 09/20/2015 02:17 PM   Modules accepted: Orders

## 2015-09-21 ENCOUNTER — Telehealth: Payer: Self-pay | Admitting: Internal Medicine

## 2015-09-21 NOTE — Telephone Encounter (Signed)
Patient came to office inquiring about his DME order for his CPAP MACHINE.  Order has been corrected and Faxed to [facility] @ [phone].  Patient understands that [facility] will contact him about setting up education of usage of the machine.  If the patient has any questions regarding his CPAP machine please have him call [facility] directly @ [phone] and press the prompt for Supplies.

## 2015-11-30 ENCOUNTER — Emergency Department (HOSPITAL_COMMUNITY)
Admission: EM | Admit: 2015-11-30 | Discharge: 2015-11-30 | Disposition: A | Discharge status: Home / Self Care | Payer: Medicaid Other | Attending: Emergency Medicine | Admitting: Emergency Medicine

## 2015-11-30 ENCOUNTER — Encounter (HOSPITAL_COMMUNITY): Payer: Self-pay | Admitting: Emergency Medicine

## 2015-11-30 DIAGNOSIS — Z880 Allergy status to penicillin: Secondary | ICD-10-CM | POA: Diagnosis not present

## 2015-11-30 DIAGNOSIS — J069 Acute upper respiratory infection, unspecified: Secondary | ICD-10-CM

## 2015-11-30 DIAGNOSIS — Z87442 Personal history of urinary calculi: Secondary | ICD-10-CM | POA: Insufficient documentation

## 2015-11-30 DIAGNOSIS — I10 Essential (primary) hypertension: Secondary | ICD-10-CM | POA: Insufficient documentation

## 2015-11-30 DIAGNOSIS — R0981 Nasal congestion: Secondary | ICD-10-CM | POA: Diagnosis present

## 2015-11-30 MED ORDER — OXYMETAZOLINE HCL 0.05 % NA SOLN: 1.0000 | Freq: Two times a day (BID) | NASAL | Status: DC

## 2015-11-30 MED ORDER — NAPROXEN 500 MG PO TABS: 500.0000 mg | ORAL_TABLET | Freq: Two times a day (BID) | ORAL | Status: DC

## 2015-11-30 MED ORDER — PSEUDOEPHEDRINE HCL 60 MG PO TABS: 60.0000 mg | ORAL_TABLET | ORAL | Status: DC | PRN

## 2015-11-30 NOTE — Discharge Instructions (Signed)
Please read and follow all provided instructions.  Your diagnoses today include:  1. Upper respiratory infection    You appear to have an upper respiratory infection (URI). An upper respiratory tract infection, or cold, is a viral infection of the air passages leading to the lungs. It should improve gradually after 5-7 days. You may have a lingering cough that lasts for 2- 4 weeks after the infection.  Tests performed today include:  Vital signs. See below for your results today.   Medications prescribed:   Naproxen - anti-inflammatory pain medication  Do not exceed 500mg  naproxen every 12 hours, take with food  You have been prescribed an anti-inflammatory medication or NSAID. Take with food. Take smallest effective dose for the shortest duration needed for your pain. Stop taking if you experience stomach pain or vomiting.    Tessalon Perles - cough suppressant medication   Oxymetazoline - nasal spray for congestion. Do not use for more than 3 days because this medicine can cause rebound congestion.   Take any prescribed medications only as directed. Treatment for your infection is aimed at treating the symptoms. There are no medications, such as antibiotics, that will cure your infection.   Home care instructions:  Follow any educational materials contained in this packet.   Your illness is contagious and can be spread to others, especially during the first 3 or 4 days. It cannot be cured by antibiotics or other medicines. Take basic precautions such as washing your hands often, covering your mouth when you cough or sneeze, and avoiding public places where you could spread your illness to others.   Please continue drinking plenty of fluids.  Use over-the-counter medicines as needed as directed on packaging for symptom relief.  You may also use ibuprofen or tylenol as directed on packaging for pain or fever.  Do not take multiple medicines containing Tylenol or acetaminophen to avoid  taking too much of this medication.  Follow-up instructions: Please follow-up with your primary care provider in the next 3 days for further evaluation of your symptoms if you are not feeling better.   Return instructions:   Please return to the Emergency Department if you experience worsening symptoms.   RETURN IMMEDIATELY IF you develop shortness of breath, confusion or altered mental status, a new rash, become dizzy, faint, or poorly responsive, or are unable to be cared for at home.  Please return if you have persistent vomiting and cannot keep down fluids or develop a fever that is not controlled by tylenol or motrin.    Please return if you have any other emergent concerns.  Additional Information:  Your vital signs today were: BP 122/79 mmHg   Pulse 85   Temp(Src) 98.7 F (37.1 C) (Oral)   Resp 18   SpO2 100% If your blood pressure (BP) was elevated above 135/85 this visit, please have this repeated by your doctor within one month. --------------

## 2015-11-30 NOTE — ED Notes (Signed)
Pt c/o flu like symptoms since yesterday. Generalized body aches. VSS.

## 2015-11-30 NOTE — ED Provider Notes (Signed)
CSN: [id number]     Arrival date & time 11/30/15  1218 History  By signing my name below, I, Reginald Campbell, attest that this documentation has been prepared under the direction and in the presence of [employer]. Electronically Signed: Sonum Campbell, Education administrator. 11/30/2015. 12:55 PM.    Chief Complaint  Patient presents with  . Influenza   The history is provided by the patient. No language interpreter was used.     HPI Comments: Reginald Campbell is a 40 y.o. male who presents to the Emergency Department complaining of gradual onset, constant nasal congestion with associated sore throat, cough, and HA that began yesterday. He has taken Benadryl without relief. He reports sick contacts at home with the same symptoms. He denies vomiting, fever, SOB.   Past Medical History  Diagnosis Date  . Hypertension   . Back pain   . Kidney stones    History reviewed. No pertinent past surgical history. Family History  Problem Relation Age of Onset  . Cancer Mother   . Cancer Father   . Diabetes Father   . Hypertension Father    Social History  Substance Use Topics  . Smoking status: Never Smoker   . Smokeless tobacco: Never Used  . Alcohol Use: No    Review of Systems  Constitutional: Negative for fever, chills and fatigue.  HENT: Positive for congestion, rhinorrhea, sinus pressure and sore throat. Negative for ear pain and trouble swallowing.   Eyes: Negative for redness.  Respiratory: Positive for cough. Negative for wheezing.   Gastrointestinal: Negative for nausea, vomiting, abdominal pain and diarrhea.  Genitourinary: Negative for dysuria.  Musculoskeletal: Negative for myalgias and neck stiffness.  Skin: Negative for rash.  Neurological: Positive for headaches.  Hematological: Negative for adenopathy.   Allergies  Penicillins  Home Medications   Prior to Admission medications   Medication Sig Start Date End Date Taking? Authorizing Provider  meloxicam (MOBIC) 7.5 MG tablet Take  1 tablet (7.5 mg total) by mouth daily. 08/18/15 08/17/16  Lucious Groves, DO   BP 122/79 mmHg  Pulse 85  Temp(Src) 98.7 F (37.1 C) (Oral)  Resp 18  SpO2 100%   Physical Exam  Constitutional: He is oriented to person, place, and time. He appears well-developed and well-nourished.  HENT:  Head: Normocephalic and atraumatic.  Right Ear: Tympanic membrane, external ear and ear canal normal.  Left Ear: Tympanic membrane, external ear and ear canal normal.  Nose: Mucosal edema and rhinorrhea present.  Mouth/Throat: Uvula is midline and mucous membranes are normal. Mucous membranes are not dry. No trismus in the jaw. No uvula swelling. Posterior oropharyngeal erythema present. No oropharyngeal exudate, posterior oropharyngeal edema or tonsillar abscesses.  Eyes: Conjunctivae are normal. Right eye exhibits no discharge. Left eye exhibits no discharge.  Neck: Normal range of motion. Neck supple.  Cardiovascular: Normal rate, regular rhythm and normal heart sounds.   Pulmonary/Chest: Effort normal and breath sounds normal. No respiratory distress. He has no wheezes. He has no rales.  Abdominal: Soft. There is no tenderness.  Lymphadenopathy:    He has no cervical adenopathy.  Neurological: He is alert and oriented to person, place, and time.  Skin: Skin is warm and dry.  Psychiatric: He has a normal mood and affect.  Nursing note and vitals reviewed.   ED Course  Procedures (including critical care time)  DIAGNOSTIC STUDIES: Oxygen Saturation is 100% on RA, normal by my interpretation.    COORDINATION OF CARE: 1:00 PM Advised to  use nasal spray and tylenol/ibuprofen for symptomatic. Return precautions given. Discussed treatment plan with pt at bedside and pt agreed to plan.   Vital signs reviewed and are as follows: Filed Vitals:   11/30/15 1229  BP: 122/79  Pulse: 85  Temp: 98.7 F (37.1 C)  Resp: 18   Patient counseled on supportive care for viral URI and s/s to return  including worsening symptoms, persistent fever, persistent vomiting, or if they have any other concerns. Urged to see PCP if symptoms persist for more than 3 days. Patient verbalizes understanding and agrees with plan.    MDM   Final diagnoses:  Upper respiratory infection   Patient with symptoms consistent with a viral syndrome. Vitals are stable, no fever. No signs of dehydration. Lung exam normal, no signs of pneumonia. Supportive therapy indicated with return if symptoms worsen.    I personally performed the services described in this documentation, which was scribed in my presence. The recorded information has been reviewed and is accurate.   Carlisle Cater, PA-C 11/30/15 [place], MD 11/30/15 [phone]

## 2016-10-01 ENCOUNTER — Ambulatory Visit (INDEPENDENT_AMBULATORY_CARE_PROVIDER_SITE_OTHER): Payer: Medicaid Other | Admitting: Internal Medicine

## 2016-10-01 VITALS — BP 119/63 | Temp 97.5°F | Wt 375.0 lb

## 2016-10-01 DIAGNOSIS — M5416 Radiculopathy, lumbar region: Secondary | ICD-10-CM | POA: Insufficient documentation

## 2016-10-01 DIAGNOSIS — F331 Major depressive disorder, recurrent, moderate: Secondary | ICD-10-CM

## 2016-10-01 DIAGNOSIS — R5383 Other fatigue: Secondary | ICD-10-CM | POA: Diagnosis not present

## 2016-10-01 DIAGNOSIS — Z6841 Body Mass Index (BMI) 40.0 and over, adult: Secondary | ICD-10-CM | POA: Diagnosis not present

## 2016-10-01 DIAGNOSIS — F329 Major depressive disorder, single episode, unspecified: Secondary | ICD-10-CM

## 2016-10-01 DIAGNOSIS — Z23 Encounter for immunization: Secondary | ICD-10-CM

## 2016-10-01 DIAGNOSIS — R5381 Other malaise: Secondary | ICD-10-CM | POA: Diagnosis not present

## 2016-10-01 DIAGNOSIS — G8929 Other chronic pain: Secondary | ICD-10-CM

## 2016-10-01 DIAGNOSIS — M5441 Lumbago with sciatica, right side: Principal | ICD-10-CM

## 2016-10-01 DIAGNOSIS — Z0000 Encounter for general adult medical examination without abnormal findings: Secondary | ICD-10-CM

## 2016-10-01 DIAGNOSIS — M5442 Lumbago with sciatica, left side: Principal | ICD-10-CM

## 2016-10-01 DIAGNOSIS — M545 Low back pain: Secondary | ICD-10-CM | POA: Diagnosis not present

## 2016-10-01 DIAGNOSIS — M25512 Pain in left shoulder: Secondary | ICD-10-CM | POA: Diagnosis not present

## 2016-10-01 HISTORY — DX: Radiculopathy, lumbar region: M54.16

## 2016-10-01 MED ORDER — MELOXICAM 7.5 MG PO TABS: 7.5000 mg | ORAL_TABLET | Freq: Every day | ORAL | 2 refills | Status: DC

## 2016-10-01 MED ORDER — DULOXETINE HCL 30 MG PO CPEP: 30.0000 mg | ORAL_CAPSULE | Freq: Every day | ORAL | 2 refills | Status: DC
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
## 2016-10-01 NOTE — Patient Instructions (Addendum)
Today we saw you for your chronic pain and depression.  We will start a medicine that will address both - cymbalta. Please take one tablet once a day. For the depression, it can take some time to build up in the system and have an effect. There is room to go up on the dose if the current one is not working after about a month or so.   Please start taking mobic once a day for your chronic pain as well. I have also sent a referral to start you with physical therapy.   Please make an appointment to be seen in 4-6 weeks, preferably by Dr. Quay Burow (formerly [facility]), to see the effect of the cymbalta and see how your pain is doing.

## 2016-10-02 ENCOUNTER — Encounter: Payer: Self-pay | Admitting: Internal Medicine
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
## 2016-10-02 NOTE — Assessment & Plan Note (Addendum)
Patient endorses battling with depression since his teens. He endorses decreased energy, decreased interest, increased sleep, and fluctuations between overeating and lack of appetite. He states he has been on antidepressants in the past with no effect; he does not remember which one specifically has been on. He denies suicidal or homicidal ideation currently or in the past. He denies substance abuse.   PSQ9 is 20/27.  Patient is in agreement with starting Cymbalta to address his depression and his chronic pain. We discussed that it may take time for the medicine to have an effect on his depression and that we have room to go up on the dosage. Next  Plan -Begin Cymbalta 30 mg daily -Follow-up in 4-6 weeks for effect and dosage adjustment as necessary -Patient is not interested in counseling at this time

## 2016-10-02 NOTE — Assessment & Plan Note (Signed)
Patient with morbid obesity with BMI today greater than 50. Patient states that his activity level is limited by his depression and chronic pain. He states that his eating pattern is not regular; there are days that he has lack of appetite and mainly sleeps throughout the day to his depression these are followed by a couple of days of overindulging. Patient is currently unemployed otherwise has limited activity outside of the house. Next  We discussed at length the benefit of regular eating schedules help prevent binging episodes as well as regulate metabolism. This is a good first step in addressing his diet. We also discussed increasing activity as permitted by his chronic pain which we are addressing. Gross addressing his depression which will hopefully increase his energy.  Plan -Continue addressing diet and follow-up visits; consider referral to nutrition in the future -Encouraged activity -Address depression and chronic pain past barriers for weight loss

## 2016-10-02 NOTE — Assessment & Plan Note (Signed)
His shoulder pain is stable and with right being worse than left. He has some intermittent numbness in his right arm but no focal weakness. Has restricted motion in that shoulder that has been unchanged. He helps take care of his sickly parents which involves helping move them. He does not report any muscle tension or aches. He has also helped a friend during a move and lifting furniture which is somewhat exacerbated his symptoms. He is interested in physical therapy as well as Cymbalta.  Plan -Refer to PT -Cymbalta 30 mg daily -Meloxicam 7.5 mg daily -Can consider steroid injection if not improving in the future

## 2016-10-02 NOTE — Assessment & Plan Note (Addendum)
Patient declined flu shot today.  He received his Tdap vaccination today  Obtain screening labs including A1c at f/u visit

## 2016-10-04 NOTE — Progress Notes (Signed)
Internal Medicine Clinic Attending  Case discussed with Dr. Svalina  at the time of the visit.  We reviewed the resident's history and exam and pertinent patient test results.  I agree with the assessment, diagnosis, and plan of care documented in the resident's note.  

## 2016-10-15 ENCOUNTER — Encounter: Payer: Self-pay | Admitting: Physical Therapy

## 2016-10-15 ENCOUNTER — Ambulatory Visit: Payer: Medicaid Other | Attending: Internal Medicine | Admitting: Physical Therapy

## 2016-10-15 DIAGNOSIS — G8929 Other chronic pain: Secondary | ICD-10-CM | POA: Diagnosis present

## 2016-10-15 DIAGNOSIS — M5442 Lumbago with sciatica, left side: Secondary | ICD-10-CM | POA: Diagnosis not present

## 2016-10-15 DIAGNOSIS — M5441 Lumbago with sciatica, right side: Secondary | ICD-10-CM | POA: Insufficient documentation
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
## 2016-10-25 ENCOUNTER — Ambulatory Visit (INDEPENDENT_AMBULATORY_CARE_PROVIDER_SITE_OTHER): Payer: Medicaid Other | Admitting: Internal Medicine

## 2016-10-25 ENCOUNTER — Encounter: Payer: Self-pay | Admitting: Internal Medicine

## 2016-10-25 DIAGNOSIS — M25521 Pain in right elbow: Secondary | ICD-10-CM | POA: Diagnosis not present

## 2016-10-25 DIAGNOSIS — M5441 Lumbago with sciatica, right side: Principal | ICD-10-CM

## 2016-10-25 DIAGNOSIS — F331 Major depressive disorder, recurrent, moderate: Secondary | ICD-10-CM

## 2016-10-25 DIAGNOSIS — G8929 Other chronic pain: Secondary | ICD-10-CM

## 2016-10-25 DIAGNOSIS — M25522 Pain in left elbow: Secondary | ICD-10-CM

## 2016-10-25 DIAGNOSIS — M7989 Other specified soft tissue disorders: Secondary | ICD-10-CM | POA: Diagnosis not present

## 2016-10-25 DIAGNOSIS — Z8739 Personal history of other diseases of the musculoskeletal system and connective tissue: Secondary | ICD-10-CM

## 2016-10-25 DIAGNOSIS — M545 Low back pain: Secondary | ICD-10-CM | POA: Diagnosis not present

## 2016-10-25 DIAGNOSIS — F329 Major depressive disorder, single episode, unspecified: Secondary | ICD-10-CM | POA: Diagnosis not present

## 2016-10-25 DIAGNOSIS — M5442 Lumbago with sciatica, left side: Principal | ICD-10-CM

## 2016-10-25 DIAGNOSIS — M25511 Pain in right shoulder: Secondary | ICD-10-CM

## 2016-10-25 DIAGNOSIS — M25512 Pain in left shoulder: Secondary | ICD-10-CM

## 2016-10-25 MED ORDER — DULOXETINE HCL 60 MG PO CPEP: 60.0000 mg | ORAL_CAPSULE | Freq: Every day | ORAL | 2 refills | Status: DC

## 2016-10-25 NOTE — Progress Notes (Signed)
   CC: back pain, elbow pain, depression  HPI:  [name] is a 41 y.o. with a PMH of morbid obesity, OSA, nephrolithiasis, MDD and chronic back and shoulder pain presenting to clinic for back pain, elbow pain and depression.  Patient seen on 3/5 and started on Cymbalta 30 mg daily for both depression and chronic pain. PHQ9 at the time was 20 out of 27. Patient states he has not seen any change in his depression symptoms and denies worsening symptoms. PHQ9 today is 13 showing a significant improvement of his reported symptoms.   He has chronic and slowly progressive back pain and appears acutely worsened due to patient moving furniture recently; started on Cymbalta 30mg  daily and meloxicam. He was also having acute flare of his bil shoulder pain R>L. He was evaluated by PT and given exercises to complete at home. Patient states that the medicines and physical therapy have not made a difference in his pain. He states the exercises have not helped in the past and did not help during his session and has not been doing many of them at home, but then states that one stretch does help. He states his shoulder pain is resolved, but now endorses bilateral elbow pain and swelling. He denies focal weakness, neck pain, numbness or tingling.   Please see problem based Assessment and Plan for status of patients chronic conditions.  Past Medical History:  Diagnosis Date  . Back pain   . BRBPR (bright red blood per rectum) 04/07/2015  . Hypertension   . Kidney stones     Review of Systems:   Review of Systems  Gastrointestinal: Negative for heartburn and nausea.  Musculoskeletal: Positive for back pain, joint pain and myalgias. Negative for neck pain.  Neurological: Negative for tingling, sensory change and focal weakness.  Psychiatric/Behavioral: Positive for depression. Negative for substance abuse and suicidal ideas. The patient does not have insomnia.     Physical Exam:  Vitals:   10/25/16  1316  BP: 140/86  Pulse: 70  Temp: 98 F (36.7 C)  TempSrc: Oral  SpO2: 98%  Weight: (!) 370 lb 11.2 oz (168.1 kg)  Height: 6' (1.829 m)   Physical Exam  Constitutional: He is oriented to person, place, and time. No distress.  HENT:  Head: Normocephalic and atraumatic.  Neck: Normal range of motion. Neck supple.  Cardiovascular: Normal rate, regular rhythm, normal heart sounds and intact distal pulses.   Pulmonary/Chest: Effort normal and breath sounds normal. He has no wheezes. He has no rales.  Abdominal: Soft. Bowel sounds are normal. He exhibits no distension. There is no tenderness.  Musculoskeletal:  BL UE/LE ROM, strength, sensation intact Mild lumbar spine tenderness BL elbow tenderness with what appears to be joint effusion on left but on U/S is soft tissue  Neurological: He is alert and oriented to person, place, and time. No sensory deficit. He exhibits normal muscle tone.  Skin: Skin is warm and dry. Capillary refill takes less than 2 seconds. He is not diaphoretic.  Psychiatric: Thought content normal.   U/S of left elbow joint is negative for joint space effusion.  Assessment & Plan:   See Encounters Tab for problem based charting.   Patient seen with Dr. Nilsa Nutting, MD Internal Medicine PGY1

## 2016-10-25 NOTE — Patient Instructions (Signed)
It was nice seeing you again today!  I'm glad your shoulder pain has improved. For your back and elbow pain, please continue to take meloxicam 7.5mg  once a day.  For the bottle of cymbalta you have at home, please increase to 60mg  a day (2 pills a day); the next refill you pick up from the pharmacy will be the new higher dosage, so just one pill once a day.   Please continue to do the exercises PT showed you. Use heat and ice (whichever one works better for you) every day.   Please make an appointment in 2 months.

## 2016-10-26 ENCOUNTER — Encounter: Payer: Self-pay | Admitting: Internal Medicine

## 2016-10-26 NOTE — Assessment & Plan Note (Signed)
Patient reports resolution of pain and exam is significantly improved since last visit with full ROM, strength.   Plan: --encouraged stretching and strengthening exercises --meloxicam daily PRN

## 2016-10-26 NOTE — Assessment & Plan Note (Signed)
Patient seen on 3/5 and started on Cymbalta 30 mg daily for both depression and chronic pain. PHQ9 at the time was 20 out of 27. Patient states he has not seen any change in his depression symptoms and denies worsening symptoms. PHQ9 today is 13 showing a significant improvement of his reported symptoms.  Plan: --increase cymbalta to 60mg  daily; provided next refill for 60mg  capsules

## 2016-10-26 NOTE — Assessment & Plan Note (Signed)
He has chronic and slowly progressive back pain and appears acutely worsened due to patient moving furniture recently; started on Cymbalta 30mg  daily and meloxicam. He was evaluated by PT and given exercises to complete at home. Patient states that the medicines and physical therapy have not made a difference in his pain. He states the exercises have not helped in the past and did not help during his session and has not been doing many of them at home, but then states that one stretch does help.  Exam reveals significantly less lumbar spine tenderness compared to prior exam. His shoulders now have full ROM compared to last visit's limited exam. I think he is overall objectively having improvement, however is not able to recognize it possibly due to his continued depression symptoms.  Plan: --encouraged patient to continue doing exercises and increase his activity --encouraged diet changes and increase in activity for weight loss --increase cymbalta to 60mg  daily --meloxicam 7.5mg  daily PRN --f/u in two months

## 2016-10-29 NOTE — Progress Notes (Signed)
Internal Medicine Clinic Attending  I saw and evaluated the patient.  I personally confirmed the key portions of the history and exam documented by Dr. Svalina and I reviewed pertinent patient test results.  The assessment, diagnosis, and plan were formulated together and I agree with the documentation in the resident's note.  

## 2017-01-02 ENCOUNTER — Encounter: Payer: Self-pay | Admitting: *Deleted

## 2017-01-23 ENCOUNTER — Ambulatory Visit (INDEPENDENT_AMBULATORY_CARE_PROVIDER_SITE_OTHER): Payer: Medicaid Other | Admitting: Internal Medicine

## 2017-01-23 DIAGNOSIS — G4733 Obstructive sleep apnea (adult) (pediatric): Secondary | ICD-10-CM

## 2017-01-23 DIAGNOSIS — T819XXA Unspecified complication of procedure, initial encounter: Secondary | ICD-10-CM

## 2017-01-23 DIAGNOSIS — M5416 Radiculopathy, lumbar region: Secondary | ICD-10-CM | POA: Diagnosis not present

## 2017-01-23 DIAGNOSIS — F331 Major depressive disorder, recurrent, moderate: Secondary | ICD-10-CM

## 2017-01-23 DIAGNOSIS — G8929 Other chronic pain: Secondary | ICD-10-CM | POA: Diagnosis present

## 2017-01-23 DIAGNOSIS — Z6841 Body Mass Index (BMI) 40.0 and over, adult: Secondary | ICD-10-CM | POA: Diagnosis not present

## 2017-01-23 DIAGNOSIS — F329 Major depressive disorder, single episode, unspecified: Secondary | ICD-10-CM

## 2017-01-23 DIAGNOSIS — N9989 Other postprocedural complications and disorders of genitourinary system: Secondary | ICD-10-CM

## 2017-01-23 DIAGNOSIS — M5442 Lumbago with sciatica, left side: Secondary | ICD-10-CM

## 2017-01-23 DIAGNOSIS — M5441 Lumbago with sciatica, right side: Secondary | ICD-10-CM

## 2017-01-23 HISTORY — DX: Unspecified complication of procedure, initial encounter: T81.9XXA

## 2017-01-23 LAB — GLUCOSE, CAPILLARY: Glucose-Capillary: 109 mg/dL — ABNORMAL HIGH (ref 65–99)

## 2017-01-23 LAB — POCT GLYCOSYLATED HEMOGLOBIN (HGB A1C): HEMOGLOBIN A1C: 5.3

## 2017-01-23 MED ORDER — NAPROXEN 500 MG PO TABS: 500.0000 mg | ORAL_TABLET | Freq: Two times a day (BID) | ORAL | 2 refills | Status: DC

## 2017-01-23 NOTE — Patient Instructions (Signed)
Reginald Campbell,  It was good to see you today. For your back pain, please take naproxen 500 mg twice a day with meals. Take this every day twice a day. Please follow up in 1 month to reassess your back pain. Continue to work on weight loss and exercises. Your hemoglobin A1c today was 5.3 which indicates that you do not have diabetes. We have checked a basic metabolic panel to look at her kidneys, sodium, potassium and I will call you with those results and. We will also refer you to urology.   Back Exercises If you have pain in your back, do these exercises 2-3 times each day or as told by your doctor. When the pain goes away, do the exercises once each day, but repeat the steps more times for each exercise (do more repetitions). If you do not have pain in your back, do these exercises once each day or as told by your doctor. Exercises Single Knee to Chest  Do these steps 3-5 times in a row for each leg: 1. Lie on your back on a firm bed or the floor with your legs stretched out. 2. Bring one knee to your chest. 3. Hold your knee to your chest by grabbing your knee or thigh. 4. Pull on your knee until you feel a gentle stretch in your lower back. 5. Keep doing the stretch for 10-30 seconds. 6. Slowly let go of your leg and straighten it.  Pelvic Tilt  Do these steps 5-10 times in a row: 1. Lie on your back on a firm bed or the floor with your legs stretched out. 2. Bend your knees so they point up to the ceiling. Your feet should be flat on the floor. 3. Tighten your lower belly (abdomen) muscles to press your lower back against the floor. This will make your tailbone point up to the ceiling instead of pointing down to your feet or the floor. 4. Stay in this position for 5-10 seconds while you gently tighten your muscles and breathe evenly.  Cat-Cow  Do these steps until your lower back bends more easily: 1. Get on your hands and knees on a firm surface. Keep your hands under your shoulders,  and keep your knees under your hips. You may put padding under your knees. 2. Let your head hang down, and make your tailbone point down to the floor so your lower back is round like the back of a cat. 3. Stay in this position for 5 seconds. 4. Slowly lift your head and make your tailbone point up to the ceiling so your back hangs low (sags) like the back of a cow. 5. Stay in this position for 5 seconds.  Press-Ups  Do these steps 5-10 times in a row: 1. Lie on your belly (face-down) on the floor. 2. Place your hands near your head, about shoulder-width apart. 3. While you keep your back relaxed and keep your hips on the floor, slowly straighten your arms to raise the top half of your body and lift your shoulders. Do not use your back muscles. To make yourself more comfortable, you may change where you place your hands. 4. Stay in this position for 5 seconds. 5. Slowly return to lying flat on the floor.  Bridges  Do these steps 10 times in a row: 1. Lie on your back on a firm surface. 2. Bend your knees so they point up to the ceiling. Your feet should be flat on the floor. 3. Tighten  your butt muscles and lift your butt off of the floor until your waist is almost as high as your knees. If you do not feel the muscles working in your butt and the back of your thighs, slide your feet 1-2 inches farther away from your butt. 4. Stay in this position for 3-5 seconds. 5. Slowly lower your butt to the floor, and let your butt muscles relax.  If this exercise is too easy, try doing it with your arms crossed over your chest. Belly Crunches  Do these steps 5-10 times in a row: 1. Lie on your back on a firm bed or the floor with your legs stretched out. 2. Bend your knees so they point up to the ceiling. Your feet should be flat on the floor. 3. Cross your arms over your chest. 4. Tip your chin a little bit toward your chest but do not bend your neck. 5. Tighten your belly muscles and slowly  raise your chest just enough to lift your shoulder blades a tiny bit off of the floor. 6. Slowly lower your chest and your head to the floor.  Back Lifts Do these steps 5-10 times in a row: 1. Lie on your belly (face-down) with your arms at your sides, and rest your forehead on the floor. 2. Tighten the muscles in your legs and your butt. 3. Slowly lift your chest off of the floor while you keep your hips on the floor. Keep the back of your head in line with the curve in your back. Look at the floor while you do this. 4. Stay in this position for 3-5 seconds. 5. Slowly lower your chest and your face to the floor.  Contact a doctor if:  Your back pain gets a lot worse when you do an exercise.  Your back pain does not lessen 2 hours after you exercise. If you have any of these problems, stop doing the exercises. Do not do them again unless your doctor says it is okay. Get help right away if:  You have sudden, very bad back pain. If this happens, stop doing the exercises. Do not do them again unless your doctor says it is okay. This information is not intended to replace advice given to you by your health care provider. Make sure you discuss any questions you have with your health care provider. Document Released: 08/18/2010 Document Revised: 12/22/2015 Document Reviewed: 09/09/2014 Elsevier Interactive Patient Education  [employer].
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
## 2017-01-23 NOTE — Assessment & Plan Note (Signed)
Patient reports he is no longer using his CPAP machine as it made him feel claustrophobic. He reports that he returned his CPAP machine.

## 2017-01-23 NOTE — Assessment & Plan Note (Signed)
Patient is morbidly obese. He has recently lost 15 pounds and today weighs 367 pounds. We discussed strategies for weight loss given patient is limited in exercise given his chronic lumbar radiculopathy. He is focusing mainly on diet at this time.  Assessment: Morbid obesity  Plan: -Continue to work on weight loss, particularly focusing on diet -Hemoglobin A1c 5.3

## 2017-01-23 NOTE — Assessment & Plan Note (Signed)
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 He states that recently he is experiencing tightening of the area with obstructive urinary symptoms and tenderness. He is likely experiencing phimosis. We will refer him to urology.  Assessment: Phimosis  Plan: -Referral to urology
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
## 2017-01-23 NOTE — Assessment & Plan Note (Signed)
Patient was recently seen for depression in March 2018. At the beginning of the month, patient scored a 20 on the PHQ9 test and patient was started on duloxetine 30 mg daily and followed up in 2 weeks. At that time, score reduced to 13 out of 27. However, patient did not feel that the duloxetine was helping his depression despite increased to 60 mg daily. He has since stopped taking the medication. My guess is that he did not allow enough time for it to work. He reports that he has been tried on many different medications in the past without improvement in his symptoms and states he had adverse effects such as fatigue. He is adverse to trying any new medications today. His score today is an 8 out of 27. He prefers to manage his symptoms on his own.  Assessment: Major depressive disorder, mild  Plan: -Continue to monitor -Consider another trial of duloxetine as I do not think patient gave it enough time to work appropriately and it may benefit his chronic pain

## 2017-01-24 ENCOUNTER — Telehealth: Payer: Self-pay | Admitting: *Deleted

## 2017-01-24 LAB — BMP8+ANION GAP
Anion Gap: 15 mmol/L (ref 10.0–18.0)
BUN / CREAT RATIO: 15 (ref 9–20)
BUN: 13 mg/dL (ref 6–24)
CO2: 24 mmol/L (ref 20–29)
CREATININE: 0.86 mg/dL (ref 0.76–1.27)
Calcium: 9.3 mg/dL (ref 8.7–10.2)
Chloride: 102 mmol/L (ref 96–106)
GFR calc Af Amer: 124 mL/min/{1.73_m2} (ref >59)
GFR, EST NON AFRICAN AMERICAN: 107 mL/min/{1.73_m2} (ref >59)
Glucose: 106 mg/dL — ABNORMAL HIGH (ref 65–99)
Potassium: 4.6 mmol/L (ref 3.5–5.2)
SODIUM: 141 mmol/L (ref 134–144)

## 2017-01-24 NOTE — Telephone Encounter (Signed)
Pt called back to speak with a nurse. Please call back.  

## 2017-01-24 NOTE — Telephone Encounter (Signed)
Pt rtc spoke to him about results, reassured him, he has no questions, call ended

## 2017-01-24 NOTE — Telephone Encounter (Signed)
Called pt - no answer; left message to give [place] a call back about lab results.

## 2017-01-24 NOTE — Progress Notes (Signed)
Internal Medicine Clinic Attending  Case discussed with Dr. Burns soon after the resident saw the patient.  We reviewed the resident's history and exam and pertinent patient test results.  I agree with the assessment, diagnosis, and plan of care documented in the resident's note. 

## 2017-02-13 ENCOUNTER — Encounter: Payer: Self-pay | Admitting: Internal Medicine

## 2017-02-13 ENCOUNTER — Ambulatory Visit (INDEPENDENT_AMBULATORY_CARE_PROVIDER_SITE_OTHER): Payer: Medicaid Other | Admitting: Internal Medicine

## 2017-02-13 DIAGNOSIS — G8929 Other chronic pain: Secondary | ICD-10-CM | POA: Diagnosis not present

## 2017-02-13 DIAGNOSIS — M5441 Lumbago with sciatica, right side: Secondary | ICD-10-CM | POA: Diagnosis not present

## 2017-02-13 DIAGNOSIS — F329 Major depressive disorder, single episode, unspecified: Secondary | ICD-10-CM | POA: Diagnosis not present

## 2017-02-13 DIAGNOSIS — Z6841 Body Mass Index (BMI) 40.0 and over, adult: Secondary | ICD-10-CM | POA: Diagnosis not present

## 2017-02-13 DIAGNOSIS — G4733 Obstructive sleep apnea (adult) (pediatric): Secondary | ICD-10-CM

## 2017-02-13 DIAGNOSIS — M5416 Radiculopathy, lumbar region: Secondary | ICD-10-CM

## 2017-02-13 DIAGNOSIS — T819XXD Unspecified complication of procedure, subsequent encounter: Secondary | ICD-10-CM

## 2017-02-13 DIAGNOSIS — M5442 Lumbago with sciatica, left side: Secondary | ICD-10-CM

## 2017-02-13 DIAGNOSIS — F331 Major depressive disorder, recurrent, moderate: Secondary | ICD-10-CM

## 2017-02-13 NOTE — Assessment & Plan Note (Signed)
Patient denied suicidal thoughts, homicidal thoughts or feelings of hopelessness. He stated that he deals with things well and does not feel that his depression is really and issue. He enjoys fishing and spending time with his kids.

## 2017-02-13 NOTE — Progress Notes (Signed)
  HPI: Reginald Campbell is a pleasant 41 year old male who presented today for a routine visit to meet his new PCP. He denied complaints other than continual chronic low back pain that was 3/10 today, lower than usual. The pain is consistent in character as his prior pain with no real changes in presentation or progression. He admitted to enjoying fishing, and spending time with his kids.   Objective:  Vital signs in last 24 hours: Vitals:   02/13/17 1336  BP: 126/75  Pulse: 67  Temp: 98.7 F (37.1 C)  TempSrc: Oral  SpO2: 98%  Weight: (!) 367 lb 1.6 oz (166.5 kg)  Height: 5\' 11"  (1.803 m)   Complete ROS negative except as per HPI. Physical Exam  Constitutional: He is oriented to person, place, and time. He appears well-developed and well-nourished. No distress.  HENT:  Head: Normocephalic and atraumatic.  Eyes: Pupils are equal, round, and reactive to light. EOM are normal. Right eye exhibits no discharge. Left eye exhibits no discharge. No scleral icterus.  Neck: Normal range of motion.  Cardiovascular: Normal rate, regular rhythm and normal heart sounds.   No murmur heard. Pulmonary/Chest: Effort normal and breath sounds normal. No respiratory distress. He has no wheezes.  Abdominal: Soft. Bowel sounds are normal. He exhibits no distension. There is no tenderness.  Musculoskeletal: He exhibits no edema or tenderness.  Neurological: He is alert and oriented to person, place, and time.  Skin: Skin is warm and dry. He is not diaphoretic.  Psychiatric: He has a normal mood and affect. His behavior is normal. Judgment and thought content normal.     Assessment/Plan:  See problem based assessment.    Kathi Ludwig, MD 02/13/2017, 2:36 PM

## 2017-02-13 NOTE — Assessment & Plan Note (Signed)
Patient stated that his pain persisted but it is only a 3/10 today. The pain has not changed in character, still radiates down his legs at times and is not associated with bowel or bladder disfunction. He continues to take his naproxen 500mg  two times daily. He denied concerns with the pain today. Advised him to increased daily activity to three ten minute walks and to increased appropriate stretching as per his usual routine. He was also advised to avoid aggravating factors.

## 2017-02-13 NOTE — Assessment & Plan Note (Signed)
Resolved

## 2017-02-13 NOTE — Assessment & Plan Note (Signed)
Discussed patient obesity at length. Weight loss advise was given and patient was informed to contact his PCP with any questions or desire to receive assistance with this. He stated that he continues to work on his weight loss and has made progress recently. He admits to consuming too high a quantity of sodas daily and agreed to begin monitoring that intake while also attempting to cut back slowly.  Patient was advised to continue keeping his 12 hours fasting window for dietary and diabetes control.

## 2017-02-13 NOTE — Assessment & Plan Note (Signed)
Patient stated that his CPAP made him feel like he was being smothered. He was advised to contact the company that supplied the machine to determine if he could have an alternative mask and machine settings that would best benefit him while also permitting increased comfort. He returned the machine and may need to be re-qualified for insurance purposes. Will follow-up with this as needed.

## 2017-02-13 NOTE — Patient Instructions (Signed)
Weight: Please continue to closely monitor your diet.  As we discussed, choose one food group/area and begin cutting back on this group with the eventual plan of complete cessation. This may take several months to a year or so to accomplish but is worth the time. Please consider consulting your physician for further assistance with this issue as you move forward. In addition, continue to not eat or drink anything with calories in it for at least 12 hours per day. If you experience lightheadedness please consult your physician.   Lumbar pain: Please continue to attempt walking 10 minutes 3 times daily as tolerated. Please continue to complete your stretching and increase activity as tolerated.  Avoid known irritants or activities that aggravate your back pain.  MDD: Please do not refrain from contacting your physicain for feelings of depression or hopelessness.   OSA: Please consult the company that managed your CPAP to determine better mask placement.   Follow-up in 6 months or sooner if needed.

## 2017-03-06 ENCOUNTER — Encounter: Payer: Self-pay | Admitting: Internal Medicine

## 2017-03-06 ENCOUNTER — Ambulatory Visit (INDEPENDENT_AMBULATORY_CARE_PROVIDER_SITE_OTHER): Payer: Medicaid Other | Admitting: Internal Medicine

## 2017-03-06 VITALS — BP 126/74 | HR 69 | Temp 98.1°F | Ht 71.0 in | Wt 365.7 lb

## 2017-03-06 DIAGNOSIS — Z801 Family history of malignant neoplasm of trachea, bronchus and lung: Secondary | ICD-10-CM | POA: Diagnosis not present

## 2017-03-06 DIAGNOSIS — K644 Residual hemorrhoidal skin tags: Secondary | ICD-10-CM

## 2017-03-06 DIAGNOSIS — R197 Diarrhea, unspecified: Secondary | ICD-10-CM | POA: Diagnosis not present

## 2017-03-06 DIAGNOSIS — K625 Hemorrhage of anus and rectum: Secondary | ICD-10-CM

## 2017-03-06 DIAGNOSIS — Z6841 Body Mass Index (BMI) 40.0 and over, adult: Secondary | ICD-10-CM

## 2017-03-06 DIAGNOSIS — Z808 Family history of malignant neoplasm of other organs or systems: Secondary | ICD-10-CM | POA: Diagnosis not present

## 2017-03-06 LAB — CBC
HCT: 43.2 % (ref 39.0–52.0)
HEMOGLOBIN: 14.3 g/dL (ref 13.0–17.0)
MCH: 29.2 pg (ref 26.0–34.0)
MCHC: 33.1 g/dL (ref 30.0–36.0)
MCV: 88.2 fL (ref 78.0–100.0)
Platelets: 158 10*3/uL (ref 150–400)
RBC: 4.9 MIL/uL (ref 4.22–5.81)
RDW: 14 % (ref 11.5–15.5)
WBC: 7 10*3/uL (ref 4.0–10.5)

## 2017-03-06 NOTE — Patient Instructions (Signed)
Thank you for visiting clinic today. If you continue to bleed heavily, or develop shortness of breath, being dizzy or chest pain, please go to emergency department. You have an appointment with [facility] gastroenterology on Friday at 1:30 PM. Please try to be there by 1:15 to complete paperwork. Please drink plenty of fluids and keep yourself well hydrated.

## 2017-03-06 NOTE — Progress Notes (Signed)
   CC: Bright red blood per rectum for the past 3 days.  HPI:  [name] is a 41 y.o. with past medical history significant for lower back pain and morbid obesity came to the clinic with complaint of bright red blood per rectum or last 3 days.  According to patient he is having bright red blood dripping from his rectum separately from his stools. He does complain of mild diarrhea in which he is having 2-3 loose stools per day for the past 2 days. He denies any abdominal pain, recent change in his medication. He denies any new supplements or herbal medicine. He is not on any blood thinners. He denies any melena or urinary symptoms. He denies any recent illnesses or travel. He has an history of external hemorrhoids but states that he never bled like that. Normally he gets mild bleeding when he wipes himself, that normally occurs with constipation and hard stools. He denies any recent constipation or hard stools. He is having intentional weight loss, lost about 35 pounds in past 6 month. His current weight dropped to 365 pound from 400 pound recorded previously.  He does not has any personal history or family history of colon cancer. Never had a colonoscopy. He does has an history of lung cancer in his aunt and uncle and his grandmother died of melanoma.  Past Medical History:  Diagnosis Date  . Back pain   . BRBPR (bright red blood per rectum) 04/07/2015  . Hypertension   . Kidney stones    Review of Systems:  As per HPI.  Physical Exam:  Vitals:   03/06/17 1342  BP: 126/74  Pulse: 69  Temp: 98.1 F (36.7 C)  TempSrc: Oral  SpO2: 98%  Weight: (!) 365 lb 11.2 oz (165.9 kg)  Height: 5\' 11"  (1.803 m)    General: Vital signs reviewed.  Patient is well-developed and Morbidly obese gentleman, in no acute distress and cooperative with exam.  Cardiovascular: RRR, S1 normal, S2 normal, no murmurs, gallops, or rubs. Pulmonary/Chest: Clear to auscultation bilaterally, no wheezes, rales,  or rhonchi. Abdominal: Soft, non-tender, obese, non-distended,  BS +.  Rectal. Bright red blood at the gluteal cleft, external hemorrhoid with no obvious sign of bleeding, bright red blood present in rectum. Extremities: No lower extremity edema bilaterally,  pulses symmetric and intact bilaterally. No cyanosis or clubbing. Skin: Warm, dry and intact. No rashes or erythema. Psychiatric: Patient appears anxious, speech and behavior is normal. Cognition and memory are normal.  Assessment & Plan:   See Encounters Tab for problem based charting.  Patient discussed with Dr. Lynnae January.

## 2017-03-07 DIAGNOSIS — K625 Hemorrhage of anus and rectum: Secondary | ICD-10-CM | POA: Insufficient documentation

## 2017-03-07 HISTORY — DX: Hemorrhage of anus and rectum: K62.5

## 2017-03-07 NOTE — Assessment & Plan Note (Signed)
Also patient has history of hemorrhoids, with mild bleeding occasionally with constipation and hard stool.  He continued to have per rectum bleeding for 3 days, according to patient this bleeding is very different from his regular bleed from his hemorrhoid. According to patient he was pouring blood in toilet bowel. He might be having some AVM, internal hemorrhoid can be a possibility. He was hemodynamically stable.  -Stat CBC-was within normal limit, hemoglobin was 14.3, it was 15 one year ago. -I talked with [facility], they can see him on Friday at 1:30 PM. -Patient Go home with instructions that if he continue to bleed more, become symptomatic, he should come to ED immediately. -He was advised to increase his fluid intake to prevent hypovolemia.

## 2017-03-08 ENCOUNTER — Encounter: Payer: Self-pay | Admitting: Gastroenterology

## 2017-03-08 ENCOUNTER — Ambulatory Visit (INDEPENDENT_AMBULATORY_CARE_PROVIDER_SITE_OTHER): Payer: Medicaid Other | Admitting: Gastroenterology

## 2017-03-08 VITALS — BP 106/76 | HR 84 | Ht 69.5 in | Wt 366.4 lb

## 2017-03-08 DIAGNOSIS — K625 Hemorrhage of anus and rectum: Secondary | ICD-10-CM

## 2017-03-08 DIAGNOSIS — K648 Other hemorrhoids: Secondary | ICD-10-CM

## 2017-03-08 MED ORDER — HYDROCORTISONE ACETATE 25 MG RE SUPP: RECTAL | 1 refills | Status: DC

## 2017-03-08 NOTE — Progress Notes (Signed)
03/08/2017 Reginald Campbell [id number] 11/16/1975   HISTORY OF PRESENT ILLNESS:  This is a 41 year old male who is new to our office. He has past medical history of hypertension, chronic back pain, fatty liver, and kidney stones as well as sleep apnea. He was sent to our office today at the request of Dr. Reesa Chew for evaluation regarding rectal bleeding. He tells me that this began 4 days ago. He sees bright red blood with bowel movements. He says that he has seen some bright red blood on the toilet paper in the past, but this has been somewhat more in volume with some blood dripping in the toilet. He does not see blood between bowel movements. He denies straining or constipation. Says he moves his bowels regularly. Denies rectal discomfort or abdominal pain.  He does mention some low back pain on the right, but says that once again he always has issues with back pain and he does not have any associated abdominal discomfort.   Past Medical History:  Diagnosis Date  . Back pain   . BRBPR (bright red blood per rectum) 04/07/2015  . Fatty liver   . Hypertension   . Kidney stones    Past Surgical History:  Procedure Laterality Date  . NO PAST SURGERIES      reports that he quit smoking about 22 years ago. He quit after 1.50 years of use. He has never used smokeless tobacco. He reports that he does not drink alcohol or use drugs. family history includes Asthma in his daughter; Cirrhosis in his father; Diabetes in his father; Erythema nodosum in his daughter; Gallbladder disease in his daughter; Heart disease in his maternal grandfather and mother; Hypertension in his father; Lung cancer in his paternal aunt and paternal uncle; Melanoma in his paternal grandmother. Allergies  Allergen Reactions  . Penicillins Hives and Swelling      Outpatient Encounter Prescriptions as of 03/08/2017  Medication Sig  . naproxen (NAPROSYN) 500 MG tablet Take 1 tablet (500 mg total) by mouth 2 (two) times daily  with a meal.   No facility-administered encounter medications on file as of 03/08/2017.      REVIEW OF SYSTEMS  : All other systems reviewed and negative except where noted in the History of Present Illness.   PHYSICAL EXAM: Ht 5' 9.5" (1.765 m) Comment: height measured without shoes  Wt (!) 366 lb 6 oz (166.2 kg)   BMI 53.33 kg/m  General: Well developed white male in no acute distress Head: Normocephalic and atraumatic Eyes:  Sclerae anicteric, conjunctiva pink. Ears: Normal auditory acuity Lungs: Clear throughout to auscultation; no increased WOB. Heart: Regular rate and rhythm; no M/R/G. Abdomen: Soft, obese.  Non-distended.  BS present.  Non-tender. Rectal:  External hemorrhoids noted.  DRE did not reveal any masses.  Anoscopy performed and showed moderate sized internal hemorrhoids with area of active/recent bleeding. Musculoskeletal: Symmetrical with no gross deformities  Skin: No lesions on visible extremities Extremities: No edema  Neurological: Alert oriented x 4, grossly non-focal Psychological:  Alert and cooperative. Normal mood and affect  ASSESSMENT AND PLAN: -Rectal bleeding:  Quite confident that it is due to internal hemorrhoids with bleeding on exam today.  Will treat with hydrocortisone suppositories twice daily for 7 days. This is his first bout with this, but if it becomes recurrent then may need to consider hemorrhoid banding.  I would like him to call back sometime next week with an update.   CC:  Kathi Ludwig,  MD   

## 2017-03-08 NOTE — Patient Instructions (Addendum)
We have sent the following medications to your pharmacy for you to pick up at your convenience: 1. Hydrocortisone suppositories.  Call us back med week, next week and ask for Patty.

## 2017-03-11 DIAGNOSIS — N4883 Acquired buried penis: Secondary | ICD-10-CM | POA: Diagnosis not present

## 2017-03-11 DIAGNOSIS — N471 Phimosis: Secondary | ICD-10-CM | POA: Diagnosis not present

## 2017-03-11 DIAGNOSIS — N200 Calculus of kidney: Secondary | ICD-10-CM | POA: Diagnosis not present

## 2017-03-11 NOTE — Progress Notes (Signed)
Internal Medicine Clinic Attending  Case discussed with Dr. Amin at the time of the visit.  We reviewed the resident's history and exam and pertinent patient test results.  I agree with the assessment, diagnosis, and plan of care documented in the resident's note.    

## 2017-03-11 NOTE — Progress Notes (Signed)
I agree with the above note, plan 

## 2017-03-13 ENCOUNTER — Other Ambulatory Visit: Payer: Self-pay | Admitting: Urology

## 2017-03-15 NOTE — Progress Notes (Signed)
03-06-17 (EPIC) CBC

## 2017-03-15 NOTE — Patient Instructions (Addendum)
Reginald Campbell  03/15/2017   Your procedure is scheduled on: 03-27-17  Report to [facility]  Entrance Take [place]  Elevators to 3rd floor to  [place] at 11:00 AM.   Call this number if you have problems the morning of surgery  [phone]   Remember: ONLY 1 PERSON MAY GO WITH YOU TO SHORT STAY TO GET  READY MORNING OF [name].  Do not eat food :After Midnight. CLEAR LIQUIDS FROM MIDNIGHT UNTIL 700 AM DAY OF SURGERY- NOTHING MY MOUTH AFTER 700 AM DAY OF SURGERY     CLEAR LIQUID DIET   Foods Allowed                                                                     Foods Excluded  Coffee and tea, regular and decaf                             liquids that you cannot  Plain Jell-O in any flavor                                             see through such as: Fruit ices (not with fruit pulp)                                     milk, soups, orange juice  Iced Popsicles                                    All solid food Carbonated beverages, regular and diet                                    Cranberry, grape and apple juices Sports drinks like Gatorade Lightly seasoned clear broth or consume(fat free) Sugar, honey syrup  Sample Menu Breakfast                                Lunch                                     Supper Cranberry juice                    Beef broth                            Chicken broth Jell-O                                     Grape juice  Apple juice Coffee or tea                        Jell-O                                      Popsicle                                                Coffee or tea                        Coffee or tea  _____________________________________________________________________     Take these medicines the morning of surgery with A SIP OF WATER: oxycodone if needed                                You may not have any metal on your body including hair pins and          piercings  Do not wear jewelry, make-up, lotions, powders or perfumes, deodorant             Men may shave face and neck.   Do not bring valuables to the hospital. [name].  Contacts, dentures or bridgework may not be worn into surgery.     Patients discharged the day of surgery will not be allowed to drive home.  Name and phone number of your driver: patient will arrange driver 45 yrs of age or older or day of surgery  Special Instructions: N/A              Please read over the following fact sheets you were given: _____________________________________________________________________             [facility] - Preparing for Surgery Before surgery, you can play an important role.  Because skin is not sterile, your skin needs to be as free of germs as possible.  You can reduce the number of germs on your skin by washing with CHG (chlorahexidine gluconate) soap before surgery.  CHG is an antiseptic cleaner which kills germs and bonds with the skin to continue killing germs even after washing. Please DO NOT use if you have an allergy to CHG or antibacterial soaps.  If your skin becomes reddened/irritated stop using the CHG and inform your nurse when you arrive at Short Stay. Do not shave (including legs and underarms) for at least 48 hours prior to the first CHG shower.  You may shave your face/neck. Please follow these instructions carefully:  1.  Shower with CHG Soap the night before surgery and the  morning of Surgery.  2.  If you choose to wash your hair, wash your hair first as usual with your  normal  shampoo.  3.  After you shampoo, rinse your hair and body thoroughly to remove the  shampoo.                           4.  Use CHG as you would any other liquid soap.  You can apply chg  directly  to the skin and wash                       Gently with a scrungie or clean washcloth.  5.  Apply the CHG Soap to your body ONLY FROM THE  NECK DOWN.   Do not use on face/ open                           Wound or open sores. Avoid contact with eyes, ears mouth and genitals (private parts).                       Wash face,  Genitals (private parts) with your normal soap.             6.  Wash thoroughly, paying special attention to the area where your surgery  will be performed.  7.  Thoroughly rinse your body with warm water from the neck down.  8.  DO NOT shower/wash with your normal soap after using and rinsing off  the CHG Soap.                9.  Pat yourself dry with a clean towel.            10.  Wear clean pajamas.            11.  Place clean sheets on your bed the night of your first shower and do not  sleep with pets. Day of Surgery : Do not apply any lotions/deodorants the morning of surgery.  Please wear clean clothes to the hospital/surgery center.  FAILURE TO FOLLOW THESE INSTRUCTIONS MAY RESULT IN THE CANCELLATION OF YOUR SURGERY PATIENT SIGNATURE_________________________________  NURSE SIGNATURE__________________________________  ________________________________________________________________________

## 2017-03-17 ENCOUNTER — Emergency Department (HOSPITAL_COMMUNITY): Payer: Medicaid Other

## 2017-03-17 ENCOUNTER — Emergency Department (HOSPITAL_COMMUNITY)
Admission: EM | Admit: 2017-03-17 | Discharge: 2017-03-17 | Disposition: A | Discharge status: Home / Self Care | Payer: Medicaid Other | Attending: Emergency Medicine | Admitting: Emergency Medicine

## 2017-03-17 ENCOUNTER — Encounter (HOSPITAL_COMMUNITY): Payer: Self-pay | Admitting: Emergency Medicine

## 2017-03-17 DIAGNOSIS — M545 Low back pain: Secondary | ICD-10-CM | POA: Diagnosis present

## 2017-03-17 DIAGNOSIS — Z87891 Personal history of nicotine dependence: Secondary | ICD-10-CM | POA: Insufficient documentation

## 2017-03-17 DIAGNOSIS — N201 Calculus of ureter: Secondary | ICD-10-CM

## 2017-03-17 DIAGNOSIS — I10 Essential (primary) hypertension: Secondary | ICD-10-CM | POA: Diagnosis not present

## 2017-03-17 DIAGNOSIS — M5441 Lumbago with sciatica, right side: Secondary | ICD-10-CM

## 2017-03-17 DIAGNOSIS — G8929 Other chronic pain: Secondary | ICD-10-CM

## 2017-03-17 DIAGNOSIS — M5442 Lumbago with sciatica, left side: Secondary | ICD-10-CM

## 2017-03-17 LAB — CBC WITH DIFFERENTIAL/PLATELET
Basophils Absolute: 0 10*3/uL (ref 0.0–0.1)
Basophils Relative: 0 %
Eosinophils Absolute: 0.1 10*3/uL (ref 0.0–0.7)
Eosinophils Relative: 1 %
HEMATOCRIT: 43.7 % (ref 39.0–52.0)
HEMOGLOBIN: 15.1 g/dL (ref 13.0–17.0)
LYMPHS ABS: 2.9 10*3/uL (ref 0.7–4.0)
LYMPHS PCT: 25 %
MCH: 30.1 pg (ref 26.0–34.0)
MCHC: 34.6 g/dL (ref 30.0–36.0)
MCV: 87.2 fL (ref 78.0–100.0)
Monocytes Absolute: 0.6 10*3/uL (ref 0.1–1.0)
Monocytes Relative: 5 %
NEUTROS ABS: 7.8 10*3/uL — AB (ref 1.7–7.7)
NEUTROS PCT: 69 %
Platelets: 200 10*3/uL (ref 150–400)
RBC: 5.01 MIL/uL (ref 4.22–5.81)
RDW: 14 % (ref 11.5–15.5)
WBC: 11.3 10*3/uL — AB (ref 4.0–10.5)

## 2017-03-17 LAB — URINALYSIS, ROUTINE W REFLEX MICROSCOPIC
Bacteria, UA: NONE SEEN
Bilirubin Urine: NEGATIVE
GLUCOSE, UA: NEGATIVE mg/dL
KETONES UR: NEGATIVE mg/dL
LEUKOCYTES UA: NEGATIVE
NITRITE: NEGATIVE
PROTEIN: NEGATIVE mg/dL
Specific Gravity, Urine: 1.021 (ref 1.005–1.030)
pH: 5 (ref 5.0–8.0)

## 2017-03-17 LAB — COMPREHENSIVE METABOLIC PANEL
ALK PHOS: 95 U/L (ref 38–126)
ALT: 17 U/L (ref 17–63)
AST: 21 U/L (ref 15–41)
Albumin: 4.2 g/dL (ref 3.5–5.0)
Anion gap: 10 (ref 5–15)
BUN: 14 mg/dL (ref 6–20)
CALCIUM: 8.9 mg/dL (ref 8.9–10.3)
CHLORIDE: 106 mmol/L (ref 101–111)
CO2: 24 mmol/L (ref 22–32)
CREATININE: 0.92 mg/dL (ref 0.61–1.24)
Glucose, Bld: 144 mg/dL — ABNORMAL HIGH (ref 65–99)
Potassium: 3.8 mmol/L (ref 3.5–5.1)
Sodium: 140 mmol/L (ref 135–145)
Total Bilirubin: 0.9 mg/dL (ref 0.3–1.2)
Total Protein: 7.8 g/dL (ref 6.5–8.1)

## 2017-03-17 MED ORDER — NAPROXEN 500 MG PO TABS: 500.0000 mg | ORAL_TABLET | Freq: Two times a day (BID) | ORAL | 0 refills | Status: DC

## 2017-03-17 MED ORDER — HYDROCODONE-ACETAMINOPHEN 5-325 MG PO TABS: 1.0000 | ORAL_TABLET | ORAL | 0 refills | Status: DC | PRN

## 2017-03-17 MED ORDER — MORPHINE SULFATE (PF) 10 MG/ML IV SOLN
10.0000 mg | Freq: Once | INTRAVENOUS | Status: AC
  Administered 2017-03-17: 10 mg via INTRAVENOUS
  Filled 2017-03-17: qty 1

## 2017-03-17 NOTE — ED Triage Notes (Signed)
Pt woke this morning with Eck[place] shooting pain in R mid back, states pain radiates to R hip and down anterior of R thigh. Pt denies any urinary symptoms.

## 2017-03-17 NOTE — ED Provider Notes (Signed)
[facility] DEPT Provider Note   CSN: [id number] Arrival date & time: 03/17/17  0736     History   Chief Complaint Chief Complaint  Patient presents with  . Back Pain    HPI Reginald Campbell is a 41 y.o. male.  HPI 41 year old male presents with severe right-sided back pain. Started this morning around 6 AM and woke him up from sleep. States it is a 10/10. It is always present. Nothing he does can make it better or worse. He has not taken any medicines for this. He had felt nauseated earlier in the day but that is resolved. No vomiting. He constantly feels urge to go to the bathroom, both to urinate and defecate. He has not no cine hematuria or dysuria. No fevers. The pain wraps around to his abdomen. He denies any radiation into his legs and denies any weakness, numbness in his legs. He tried to have a BM but could not. Has a history of lumbar radiculopathy but this feels different. Triage noted pain going into right leg but he denies this, stating it only wraps around to RLQ.  Past Medical History:  Diagnosis Date  . Back pain   . BRBPR (bright red blood per rectum) 04/07/2015  . Fatty liver   . Hypertension   . Kidney stones   . Sleep apnea     Patient Active Problem List   Diagnosis Date Noted  . Internal hemorrhoids 03/08/2017  . Rectal bleeding 03/07/2017  . 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 Morbid obesity ([place]) 10/02/2016  . Lumbar radiculopathy, chronic 10/01/2016  . MDD (major depressive disorder) 10/01/2016  . Bilateral shoulder pain 08/18/2015  . Nephrolithiasis 04/18/2015  . Healthcare maintenance 04/07/2015  . Family history of diabetes mellitus 04/07/2015  . Obstructive sleep apnea 04/07/2015    Past Surgical History:  Procedure Laterality Date  . NO PAST SURGERIES         Home Medications    Prior to Admission medications   Medication Sig Start Date End Date Taking? Authorizing Provider  HYDROcodone-acetaminophen (NORCO) 5-325 MG tablet  Take 1-2 tablets by mouth every 4 (four) hours as needed for severe pain. 03/17/17   Sherwood Gambler, MD  hydrocortisone (ANUSOL-HC) 25 MG suppository Use 1 suppository twice daily for 7 days. 03/08/17   Zehr, Laban Emperor, PA-C  naproxen (NAPROSYN) 500 MG tablet Take 1 tablet (500 mg total) by mouth 2 (two) times daily with a meal. 03/17/17   Sherwood Gambler, MD    Family History Family History  Problem Relation Age of Onset  . Heart disease Mother   . Diabetes Father   . Hypertension Father   . Cirrhosis Father   . Heart disease Maternal Grandfather   . Melanoma Paternal Grandmother   . Lung cancer Paternal Uncle   . Lung cancer Paternal Aunt   . Asthma Daughter   . Gallbladder disease Daughter   . Erythema nodosum Daughter     Social History Social History  Substance Use Topics  . Smoking status: Former Smoker    Years: 1.50    Quit date: 07/30/1994  . Smokeless tobacco: Never Used  . Alcohol use No     Allergies   Penicillins   Review of Systems Review of Systems  Constitutional: Negative for fever.  Gastrointestinal: Positive for abdominal pain and nausea. Negative for vomiting.  Genitourinary: Negative for dysuria and hematuria.  Musculoskeletal: Positive for back pain.  Neurological: Negative for weakness and numbness.  All other systems reviewed and  are negative.    Physical Exam Updated Vital Signs BP 123/85   Pulse 68   Temp 98.2 F (36.8 C) (Oral)   Resp 18   SpO2 99%   Physical Exam  Constitutional: He is oriented to person, place, and time. He appears well-developed and well-nourished.  Morbidly obese  HENT:  Head: Normocephalic and atraumatic.  Right Ear: External ear normal.  Left Ear: External ear normal.  Nose: Nose normal.  Eyes: Right eye exhibits no discharge. Left eye exhibits no discharge.  Neck: Neck supple.  Cardiovascular: Normal rate, regular rhythm and normal heart sounds.   Pulmonary/Chest: Effort normal and breath sounds normal.    Abdominal: Soft. There is no tenderness. There is no CVA tenderness.  Genitourinary: Testes normal and penis normal. Right testis shows no swelling and no tenderness. Left testis shows no swelling and no tenderness. No penile tenderness.  Musculoskeletal: He exhibits no edema.       Thoracic back: He exhibits no tenderness and no bony tenderness.       Lumbar back: He exhibits no tenderness and no bony tenderness.  Neurological: He is alert and oriented to person, place, and time.  5/5 strength in BLE. Normal gross sensation  Skin: Skin is warm and dry. He is not diaphoretic.  Nursing note and vitals reviewed.    ED Treatments / Results  Labs (all labs ordered are listed, but only abnormal results are displayed) Labs Reviewed  CBC WITH DIFFERENTIAL/PLATELET - Abnormal; Notable for the following:       Result Value   WBC 11.3 (*)    Neutro Abs 7.8 (*)    All other components within normal limits  COMPREHENSIVE METABOLIC PANEL - Abnormal; Notable for the following:    Glucose, Bld 144 (*)    All other components within normal limits  URINALYSIS, ROUTINE W REFLEX MICROSCOPIC - Abnormal; Notable for the following:    Hgb urine dipstick LARGE (*)    Squamous Epithelial / LPF 0-5 (*)    All other components within normal limits    EKG  EKG Interpretation None       Radiology Ct Renal Stone Study  Result Date: 03/17/2017 CLINICAL DATA:  Flank pain. Right-sided back pain with stone disease suspected. EXAM: CT ABDOMEN AND PELVIS WITHOUT CONTRAST TECHNIQUE: Multidetector CT imaging of the abdomen and pelvis was performed following the standard protocol without IV contrast. COMPARISON:  04/21/2015 FINDINGS: Lower chest:  No contributory findings. Hepatobiliary: Possible hepatic steatosis with subtle sparing around the gallbladder fossa.No evidence of biliary obstruction or stone. Pancreas: Unremarkable. Spleen: Unremarkable. Adrenals/Urinary Tract: Negative adrenals. Mild right  hydronephrosis and hydroureter. There is a 1 mm stone in the distal right ureter seen on 5:123. 6 mm right nephrolithiasis. Punctate left interpolar nephrolithiasis seen on reformats. No asymmetric renal enlargement or perinephric stranding. Unremarkable bladder. Stomach/Bowel:  No obstruction. No appendicitis. Vascular/Lymphatic: No acute vascular abnormality. No mass or adenopathy. Reproductive:No pathologic findings. Other: No ascites or pneumoperitoneum. Fatty enlargement of the left inguinal canal, partly seen. Musculoskeletal: No acute abnormalities. IMPRESSION: 1. Mild right hydroureteronephrosis due to 1 mm distal ureteral stone. 2. 6 mm right renal calculus.  Punctate left nephrolithiasis. Electronically Signed   By: Monte Fantasia M.D.   On: 03/17/2017 10:43    Procedures Procedures (including critical care time)  Medications Ordered in ED Medications  Morphine Sulfate (PF) SOLN 10 mg (10 mg Intravenous Given 03/17/17 1047)     Initial Impression / Assessment and Plan / ED Course  I have reviewed the triage vital signs and the nursing notes.  Pertinent labs & imaging results that were available during my care of the patient were reviewed by me and considered in my medical decision making (see chart for details).     CT confirms right ureteral stone. There is also a right-sided renal stone that is probably not causing pain today. His pain is much better and essentially gone after IV morphine. No urinary tract infection or signs of a more severe disease. Given small stone and good pain control, he is able to be discharged home and he already has outpatient urology follow-up. Discussed return precautions. Advised to use nsaids and then narcotics for breakthrough pain.  Final Clinical Impressions(s) / ED Diagnoses   Final diagnoses:  Right ureteral stone    New Prescriptions Discharge Medication List as of 03/17/2017 12:20 PM    START taking these medications   Details    HYDROcodone-acetaminophen (NORCO) 5-325 MG tablet Take 1-2 tablets by mouth every 4 (four) hours as needed for severe pain., Starting Sun 03/17/2017, Print         Sherwood Gambler, MD 03/17/17 [phone]

## 2017-03-19 ENCOUNTER — Other Ambulatory Visit: Payer: Self-pay

## 2017-03-19 ENCOUNTER — Encounter (HOSPITAL_COMMUNITY): Payer: Self-pay

## 2017-03-19 ENCOUNTER — Encounter (HOSPITAL_COMMUNITY)
Admission: RE | Admit: 2017-03-19 | Discharge: 2017-03-19 | Disposition: A | Discharge status: Home / Self Care | Payer: Medicaid Other | Source: Ambulatory Visit | Attending: Urology | Admitting: Urology

## 2017-03-19 DIAGNOSIS — I10 Essential (primary) hypertension: Secondary | ICD-10-CM | POA: Insufficient documentation

## 2017-03-19 DIAGNOSIS — N471 Phimosis: Secondary | ICD-10-CM | POA: Insufficient documentation

## 2017-03-19 DIAGNOSIS — Z01818 Encounter for other preprocedural examination: Secondary | ICD-10-CM | POA: Diagnosis not present

## 2017-03-19 HISTORY — DX: Phimosis: N47.1

## 2017-03-19 HISTORY — DX: Depression, unspecified: F32.A

## 2017-03-19 HISTORY — DX: Major depressive disorder, single episode, unspecified: F32.9

## 2017-03-19 HISTORY — DX: Personal history of urinary calculi: Z87.442

## 2017-03-19 NOTE — Progress Notes (Signed)
CBC WITH DIF, CMET 03-17-17 EPIC

## 2017-03-26 NOTE — H&P (Signed)
Reginald Campbell is an 41 y.o. male.    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 Now bothored by progressive phimosis, now cannot reduce. Makes hygeine very difficult. 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 Denies known diabetes.   PMH sig for morbid obesity, lumbago, anxiety/depression (no meds, not limiting). His PCP is Martyn Malay MD with [facility].   Today "Reginald Campbell" is seen to proceed with circumcision for severe phimosis.   Past Medical History:  Diagnosis Date  . Back pain   . BRBPR (bright red blood per rectum) 04/07/2015  . Depression   . History of kidney stones   . Hypertension    hx of elevated bp, no meds for htn  . Phimosis   . Sleep apnea    does not use cpap due to broke    Past Surgical History:  Procedure Laterality Date  . NO PAST SURGERIES      Family History  Problem Relation Age of Onset  . Heart disease Mother   . Diabetes Father   . Hypertension Father   . Cirrhosis Father   . Heart disease Maternal Grandfather   . Melanoma Paternal Grandmother   . Lung cancer Paternal Uncle   . Lung cancer Paternal Aunt   . Asthma Daughter   . Gallbladder disease Daughter   . Erythema nodosum Daughter    Social History:  reports that he quit smoking about 22 years ago. He has a 1.00 pack-year smoking history. He has never used smokeless tobacco. He reports that he does not drink alcohol or use drugs.  Allergies:  Allergies  Allergen Reactions  . Penicillins Anaphylaxis, Hives and Swelling    Pt was 15 Has patient had a PCN reaction causing immediate rash, facial/tongue/throat swelling, SOB or lightheadedness with hypotension: Yes Has patient had a PCN reaction causing severe rash involving mucus membranes or skin necrosis: No Has patient had a PCN reaction that required hospitalization: Yes Has patient had a PCN reaction occurring within the last 10 years: No If all of the above answers  are "NO", then may proceed with Cephalosporin use.     No prescriptions prior to admission.    No results found for this or any previous visit (from the past 48 hour(s)). No results found.  Review of Systems  Constitutional: Negative.   HENT: Negative.   Eyes: Negative.   Respiratory: Negative.   Cardiovascular: Negative.   Gastrointestinal: Negative.   Genitourinary: Negative.   Musculoskeletal: Negative.   Skin: Negative.   Neurological: Negative.   Endo/Heme/Allergies: Negative.   Psychiatric/Behavioral: Negative.     There were no vitals taken for this visit. Physical Exam  Constitutional: He appears well-developed.  HENT:  Head: Normocephalic.  Eyes: Pupils are equal, round, and reactive to light.  Neck: Normal range of motion.  Cardiovascular: Normal rate.   Respiratory: Effort normal.  GI: Soft.  Large truncal obesity.   Genitourinary:  Genitourinary Comments: Significant phimosis and buried penis.   Musculoskeletal: Normal range of motion.  Neurological: He is alert.  Skin: Skin is warm.  Psychiatric: He has a normal mood and affect.     Assessment/Plan  Proceed as planned with circumcision. Risks (including poor cosmesis, fact that penis will still be buried due to prepubic fat pad), benefits, alternatives, expected peri-op course discussed previously.   Alexis Frock, MD 03/26/2017, 10:43 PM

## 2017-03-26 NOTE — Discharge Instructions (Signed)
1 - Stitches are all dissolvable and will disappear over the next 3 weeks. No sexual stimulation x 2 weeks. You may shower immediately. Please keep incision area moist with over the counter neosporin or similar daily until stitches no longer visible.   2 - Call MD or go to ER for fever >102, severe pain / nausea / vomiting not relieved by medications, or acute change in medical status

## 2017-03-27 ENCOUNTER — Encounter (HOSPITAL_COMMUNITY): Payer: Self-pay | Admitting: *Deleted

## 2017-03-27 ENCOUNTER — Ambulatory Visit (HOSPITAL_COMMUNITY)
Admission: RE | Admit: 2017-03-27 | Discharge: 2017-03-27 | Disposition: A | Discharge status: Home / Self Care | Payer: Medicaid Other | Source: Ambulatory Visit | Attending: Urology | Admitting: Urology

## 2017-03-27 ENCOUNTER — Ambulatory Visit (HOSPITAL_COMMUNITY): Payer: Medicaid Other | Admitting: Anesthesiology

## 2017-03-27 ENCOUNTER — Encounter (HOSPITAL_COMMUNITY)
Admission: RE | Disposition: A | Discharge status: Home / Self Care | Payer: Self-pay | Source: Ambulatory Visit | Attending: Urology

## 2017-03-27 DIAGNOSIS — G4730 Sleep apnea, unspecified: Secondary | ICD-10-CM | POA: Diagnosis not present

## 2017-03-27 DIAGNOSIS — N471 Phimosis: Secondary | ICD-10-CM | POA: Insufficient documentation

## 2017-03-27 DIAGNOSIS — Z6841 Body Mass Index (BMI) 40.0 and over, adult: Secondary | ICD-10-CM | POA: Diagnosis not present

## 2017-03-27 DIAGNOSIS — N4883 Acquired buried penis: Secondary | ICD-10-CM | POA: Diagnosis not present

## 2017-03-27 DIAGNOSIS — I10 Essential (primary) hypertension: Secondary | ICD-10-CM | POA: Insufficient documentation

## 2017-03-27 DIAGNOSIS — Z87891 Personal history of nicotine dependence: Secondary | ICD-10-CM | POA: Insufficient documentation

## 2017-03-27 HISTORY — PX: CIRCUMCISION: SHX1350

## 2017-03-27 SURGERY — CIRCUMCISION, ADULT
Anesthesia: General

## 2017-03-27 MED ORDER — OXYCODONE HCL 5 MG PO TABS
5.0000 mg | ORAL_TABLET | Freq: Once | ORAL | Status: AC | PRN
  Administered 2017-03-27: 5 mg via ORAL

## 2017-03-27 MED ORDER — BUPIVACAINE HCL (PF) 0.25 % IJ SOLN
INTRAMUSCULAR | Status: DC | PRN
  Administered 2017-03-27: 20 mL

## 2017-03-27 MED ORDER — FENTANYL CITRATE (PF) 100 MCG/2ML IJ SOLN
INTRAMUSCULAR | Status: DC | PRN
Start: 2017-03-27 — End: 2017-03-27
  Administered 2017-03-27 (×2): 100 ug via INTRAVENOUS

## 2017-03-27 MED ORDER — ESMOLOL HCL 100 MG/10ML IV SOLN
INTRAVENOUS | Status: AC
  Filled 2017-03-27: qty 10

## 2017-03-27 MED ORDER — MIDAZOLAM HCL 5 MG/5ML IJ SOLN
INTRAMUSCULAR | Status: DC | PRN
  Administered 2017-03-27: 2 mg via INTRAVENOUS

## 2017-03-27 MED ORDER — FENTANYL CITRATE (PF) 100 MCG/2ML IJ SOLN
INTRAMUSCULAR | Status: AC
  Filled 2017-03-27: qty 2

## 2017-03-27 MED ORDER — BACITRACIN ZINC 500 UNIT/GM EX OINT
TOPICAL_OINTMENT | CUTANEOUS | Status: AC
  Filled 2017-03-27: qty 28.35

## 2017-03-27 MED ORDER — DEXAMETHASONE SODIUM PHOSPHATE 10 MG/ML IJ SOLN
INTRAMUSCULAR | Status: DC | PRN
  Administered 2017-03-27: 10 mg via INTRAVENOUS

## 2017-03-27 MED ORDER — LACTATED RINGERS IV SOLN
INTRAVENOUS | Status: DC
  Administered 2017-03-27 (×2): via INTRAVENOUS

## 2017-03-27 MED ORDER — BUPIVACAINE HCL (PF) 0.25 % IJ SOLN
INTRAMUSCULAR | Status: AC
  Filled 2017-03-27: qty 30

## 2017-03-27 MED ORDER — FENTANYL CITRATE (PF) 100 MCG/2ML IJ SOLN
INTRAMUSCULAR | Status: AC
  Administered 2017-03-27: 25 ug via INTRAVENOUS
  Filled 2017-03-27: qty 2

## 2017-03-27 MED ORDER — TRAMADOL HCL 50 MG PO TABS
50.0000 mg | ORAL_TABLET | Freq: Four times a day (QID) | ORAL | 0 refills | Status: DC | PRN
Start: 2017-03-27 — End: 2017-10-04

## 2017-03-27 MED ORDER — SENNOSIDES-DOCUSATE SODIUM 8.6-50 MG PO TABS: 1.0000 | ORAL_TABLET | Freq: Two times a day (BID) | ORAL | 0 refills | Status: DC

## 2017-03-27 MED ORDER — BACITRACIN ZINC 500 UNIT/GM EX OINT
TOPICAL_OINTMENT | CUTANEOUS | Status: DC | PRN
  Administered 2017-03-27: 1 via TOPICAL

## 2017-03-27 MED ORDER — PROPOFOL 10 MG/ML IV BOLUS
INTRAVENOUS | Status: DC | PRN
  Administered 2017-03-27: 200 mg via INTRAVENOUS

## 2017-03-27 MED ORDER — SODIUM CHLORIDE 0.9 % IR SOLN
Status: DC | PRN
  Administered 2017-03-27: 1000 mL

## 2017-03-27 MED ORDER — PROPOFOL 10 MG/ML IV BOLUS
INTRAVENOUS | Status: AC
  Filled 2017-03-27: qty 40

## 2017-03-27 MED ORDER — LIDOCAINE HCL (PF) 1 % IJ SOLN
INTRAMUSCULAR | Status: AC
  Filled 2017-03-27: qty 30

## 2017-03-27 MED ORDER — LIDOCAINE 2% (20 MG/ML) 5 ML SYRINGE
INTRAMUSCULAR | Status: DC | PRN
  Administered 2017-03-27: 60 mg via INTRAVENOUS

## 2017-03-27 MED ORDER — OXYCODONE HCL 5 MG PO TABS
ORAL_TABLET | ORAL | Status: AC
  Administered 2017-03-27: 5 mg via ORAL
  Filled 2017-03-27: qty 1

## 2017-03-27 MED ORDER — CLINDAMYCIN PHOSPHATE 900 MG/50ML IV SOLN
900.0000 mg | INTRAVENOUS | Status: AC
  Administered 2017-03-27: 900 mg via INTRAVENOUS
  Filled 2017-03-27: qty 50

## 2017-03-27 MED ORDER — SUCCINYLCHOLINE CHLORIDE 200 MG/10ML IV SOSY
PREFILLED_SYRINGE | INTRAVENOUS | Status: DC | PRN
  Administered 2017-03-27: 140 mg via INTRAVENOUS

## 2017-03-27 MED ORDER — FENTANYL CITRATE (PF) 100 MCG/2ML IJ SOLN
25.0000 ug | INTRAMUSCULAR | Status: DC | PRN
  Administered 2017-03-27: 25 ug via INTRAVENOUS
  Administered 2017-03-27: 50 ug via INTRAVENOUS
  Administered 2017-03-27: 25 ug via INTRAVENOUS

## 2017-03-27 MED ORDER — ONDANSETRON HCL 4 MG/2ML IJ SOLN
INTRAMUSCULAR | Status: DC | PRN
  Administered 2017-03-27: 4 mg via INTRAVENOUS

## 2017-03-27 MED ORDER — PROMETHAZINE HCL 25 MG/ML IJ SOLN: 6.2500 mg | INTRAMUSCULAR | Status: DC | PRN

## 2017-03-27 MED ORDER — MIDAZOLAM HCL 2 MG/2ML IJ SOLN
INTRAMUSCULAR | Status: AC
  Filled 2017-03-27: qty 2

## 2017-03-27 SURGICAL SUPPLY — 27 items
BANDAGE COBAN STERILE 2 (GAUZE/BANDAGES/DRESSINGS) ×1 IMPLANT
BLADE SURG 15 STRL LF DISP TIS (BLADE) ×1 IMPLANT
BLADE SURG 15 STRL SS (BLADE) ×3
BNDG CONFORM 2 STRL LF (GAUZE/BANDAGES/DRESSINGS) IMPLANT
DECANTER SPIKE VIAL GLASS SM (MISCELLANEOUS) IMPLANT
DRAPE LAPAROTOMY T 102X78X121 (DRAPES) ×3 IMPLANT
ELECT NDL TIP 2.8 STRL (NEEDLE) ×1 IMPLANT
ELECT NEEDLE TIP 2.8 STRL (NEEDLE) ×3 IMPLANT
ELECT PENCIL ROCKER SW 15FT (MISCELLANEOUS) ×3 IMPLANT
ELECT REM PT RETURN 15FT ADLT (MISCELLANEOUS) ×3 IMPLANT
GAUZE SPONGE 4X4 12PLY STRL (GAUZE/BANDAGES/DRESSINGS) ×1 IMPLANT
GAUZE SPONGE 4X4 16PLY XRAY LF (GAUZE/BANDAGES/DRESSINGS) ×3 IMPLANT
GLOVE BIOGEL M STRL SZ7.5 (GLOVE) ×3 IMPLANT
GOWN STRL REUS W/TWL LRG LVL3 (GOWN DISPOSABLE) ×6 IMPLANT
HOVERMATT SINGLE USE (MISCELLANEOUS) ×2 IMPLANT
KIT BASIN OR (CUSTOM PROCEDURE TRAY) ×3 IMPLANT
NDL HYPO 25X1 1.5 SAFETY (NEEDLE) IMPLANT
NEEDLE HYPO 25X1 1.5 SAFETY (NEEDLE) ×3 IMPLANT
NS IRRIG 1000ML POUR BTL (IV SOLUTION) ×2 IMPLANT
PACK BASIC VI WITH GOWN DISP (CUSTOM PROCEDURE TRAY) ×3 IMPLANT
SPONGE LAP 4X18 X RAY DECT (DISPOSABLE) ×9 IMPLANT
SUT CHROMIC 4 0 P 3 18 (SUTURE) ×4 IMPLANT
SUT CHROMIC 4 0 PS 2 18 (SUTURE) ×4 IMPLANT
SUT VIC AB 3-0 SH 27 (SUTURE) ×6
SUT VIC AB 3-0 SH 27X BRD (SUTURE) IMPLANT
SYR CONTROL 10ML LL (SYRINGE) IMPLANT
TOWEL NATURAL 10PK STERILE (DISPOSABLE) ×2 IMPLANT

## 2017-03-27 NOTE — Transfer of Care (Signed)
Immediate Anesthesia Transfer of Care Note  Patient: Reginald Campbell  Procedure(s) Performed: Procedure(s): CIRCUMCISION ADULT with canal block (N/A)  Patient Location: PACU  Anesthesia Type:General  Level of Consciousness: awake, alert  and oriented  Airway & Oxygen Therapy: Patient Spontanous Breathing and Patient connected to face mask oxygen  Post-op Assessment: Report given to RN and Post -op Vital signs reviewed and stable  Post vital signs: Reviewed and stable  Last Vitals:  Vitals:   03/27/17 1101  BP: (!) 148/83  Pulse: 90  Resp: 18  Temp: 36.8 C  SpO2: 98%    Last Pain:  Vitals:   03/27/17 1116  TempSrc:   PainSc: 0-No pain      Patients Stated Pain Goal: 3 (67/01/41 0301)  Complications: No apparent anesthesia complications

## 2017-03-27 NOTE — Anesthesia Postprocedure Evaluation (Signed)
Anesthesia Post Note  Patient: Reginald Campbell  Procedure(s) Performed: Procedure(s) (LRB): CIRCUMCISION ADULT with canal block (N/A)     Patient location during evaluation: PACU Anesthesia Type: General Level of consciousness: awake and alert Pain management: pain level controlled Vital Signs Assessment: post-procedure vital signs reviewed and stable Respiratory status: spontaneous breathing, nonlabored ventilation, respiratory function stable and patient connected to nasal cannula oxygen Cardiovascular status: blood pressure returned to baseline and stable Postop Assessment: no signs of nausea or vomiting Anesthetic complications: no    Last Vitals:  Vitals:   03/27/17 1445 03/27/17 1500  BP: (!) 168/92 (!) 149/81  Pulse: 87 87  Resp: 13 13  Temp:    SpO2: 95% 94%    Last Pain:  Vitals:   03/27/17 1500  TempSrc:   PainSc: 4                  [name]

## 2017-03-27 NOTE — Anesthesia Preprocedure Evaluation (Signed)
Anesthesia Evaluation  Patient identified by MRN, date of birth, ID band Patient awake    Reviewed: Allergy & Precautions, NPO status , Patient's Chart, lab work & pertinent test results  Airway Mallampati: III  TM Distance: <3 FB Neck ROM: Full    Dental no notable dental hx.    Pulmonary sleep apnea , former smoker,    breath sounds clear to auscultation + decreased breath sounds      Cardiovascular hypertension, Normal cardiovascular exam Rhythm:Regular Rate:Normal  Untreated HTN   Neuro/Psych negative neurological ROS  negative psych ROS   GI/Hepatic negative GI ROS, Neg liver ROS,   Endo/Other  Morbid obesity  Renal/GU negative Renal ROS  negative genitourinary   Musculoskeletal negative musculoskeletal ROS (+)   Abdominal (+) + obese,   Peds negative pediatric ROS (+)  Hematology negative hematology ROS (+)   Anesthesia Other Findings   Reproductive/Obstetrics negative OB ROS                             Anesthesia Physical Anesthesia Plan  ASA: IV  Anesthesia Plan: General   Post-op Pain Management:    Induction: Intravenous  PONV Risk Score and Plan: 1 and Ondansetron and Dexamethasone  Airway Management Planned: Oral ETT  Additional Equipment:   Intra-op Plan:   Post-operative Plan: Extubation in OR  Informed Consent: I have reviewed the patients History and Physical, chart, labs and discussed the procedure including the risks, benefits and alternatives for the proposed anesthesia with the patient or authorized representative who has indicated his/her understanding and acceptance.   Dental advisory given  Plan Discussed with: CRNA and Surgeon  Anesthesia Plan Comments:         Anesthesia Quick Evaluation

## 2017-03-27 NOTE — Interval H&P Note (Signed)
History and Physical Interval Note:  03/27/2017 12:35 PM  Reginald Campbell  has presented today for surgery, with the diagnosis of PHIMOSIS, BURIED PENIS  The various methods of treatment have been discussed with the patient and family. After consideration of risks, benefits and other options for treatment, the patient has consented to  Procedure(s): CIRCUMCISION ADULT (N/A) as a surgical intervention .  The patient's history has been reviewed, patient examined, no change in status, stable for surgery.  I have reviewed the patient's chart and labs.  Questions were answered to the patient's satisfaction.     Zyla Dascenzo

## 2017-03-27 NOTE — Brief Op Note (Signed)
03/27/2017  2:01 PM  PATIENT:  Reginald Campbell  41 y.o. male  PRE-OPERATIVE DIAGNOSIS:  PHIMOSIS, BURIED PENIS  POST-OPERATIVE DIAGNOSIS:  * No post-op diagnosis entered *  PROCEDURE:  Procedure(s): CIRCUMCISION ADULT with canal block (N/A)  SURGEON:  Surgeon(s) and Role:    * Alexis Frock, MD - Primary  PHYSICIAN ASSISTANT:   ASSISTANTS: none   ANESTHESIA:   local and general  EBL:  Total I/O In: -  Out: 5 [Blood:5]  BLOOD ADMINISTERED:none  DRAINS: none   LOCAL MEDICATIONS USED:  MARCAINE     SPECIMEN:  Source of Specimen:  phimotic foreskin  DISPOSITION OF SPECIMEN:  PATHOLOGY  COUNTS:  YES  TOURNIQUET:  * No tourniquets in log *  DICTATION: .Other Dictation: Dictation Number [id number]  PLAN OF CARE: Discharge to home after PACU  PATIENT DISPOSITION:  PACU - hemodynamically stable.   Delay start of Pharmacological VTE agent (>24hrs) due to surgical blood loss or risk of bleeding: yes

## 2017-03-27 NOTE — Anesthesia Procedure Notes (Signed)
Procedure Name: Intubation Performed by: Gean Maidens Pre-anesthesia Checklist: Patient identified, Emergency Drugs available, Suction available, Patient being monitored and Timeout performed Patient Re-evaluated:Patient Re-evaluated prior to induction Oxygen Delivery Method: Circle system utilized Preoxygenation: Pre-oxygenation with 100% oxygen Induction Type: IV induction Ventilation: Oral airway inserted - appropriate to patient size and Mask ventilation with difficulty Laryngoscope Size: Mac and 4 Grade View: Grade II Tube type: Oral Tube size: 7.5 mm Number of attempts: 2 (CRNA x1 attempt MDA x1) Airway Equipment and Method: Stylet Placement Confirmation: ETT inserted through vocal cords under direct vision,  positive ETCO2,  CO2 detector and breath sounds checked- equal and bilateral Secured at: 23 cm Tube secured with: Tape Dental Injury: Teeth and Oropharynx as per pre-operative assessment

## 2017-03-28 NOTE — Op Note (Signed)
NAME:  [name], MUSTARD                      ACCOUNT NO.:  MEDICAL RECORD NO.:  [id number]  LOCATION:                                 FACILITY:  PHYSICIAN:  Alexis Frock, MD          DATE OF BIRTH:  DATE OF PROCEDURE:                              OPERATIVE REPORT   DIAGNOSIS:  Severe phimosis and buried penis.  PROCEDURE:  Circumcision with penile block.  ESTIMATED BLOOD LOSS:  Nil.  COMPLICATION:  None.  SPECIMEN:  Phimotic foreskin.  FINDINGS: 1. Buried penis with severe phimosis. 2. Complete resolution of all phimosis following circumcision.  INDICATION:  [name] is a 41 year old man with morbid obesity, over 300 pounds.  He has never been circumcised.  He has had problems with progressive phimosis for several years now, interfering with hygiene and voiding.  Options were discussed for management including observation versus topical therapy versus dorsal slit versus circumcision, and he wished to proceed with the latter.  Informed consent was obtained and placed in the medical record.  PROCEDURE IN DETAIL:  The patient being, Reginald Campbell, was verified. Procedure being circumcision was confirmed.  Procedure was carried out. Time-out was performed.  Intravenous antibiotics were administered. General endotracheal anesthesia introduced.  The patient was placed in a supine position.  Sterile field was created by prepping and draping the patient's penis, infraumbilical abdomen, and proximal thighs using iodine.  Given the patient's severe phimosis, initial incision was made at 12 o'clock position to release the phimotic ring which allowed the glans penis to be delivered.  There was significant smegma and debris which was debrided, then a repeat prep was performed using iodinated prep.  A proximal collar corresponding to the unstretched glans penis was marked at the 12 o'clock position and a distal collar corresponding to position, approximately 4 mm proximal to the glans was  also marked and a circumferential incision was made at each of these sites and the redundant preputial collar released using cautery from underlying tissue, taking exquisite care to avoid ureteral injury which did not occur.  Hemostasis appeared excellent.  At this point, 2 anchor sutures, one at 12 o'clock and one at 6 o'clock were applied.  The 6 o'clock one being a U stitch to the frenulum and then 2 separate running suture lines of Vicryl were used from the 12 o'clock and 6 o'clock positions, one on each side, which resulted in excellent skin apposition. Following these maneuvers, complete resolution of all phimosis. Hemostasis was excellent.  Attention was then directed to the penile block.  10 mL of 0.25% plain Marcaine was instilled in a dorsal block type fashion, at approximately 1 fingerbreadth beneath the pubic ramus and additional 10 mL in a ring block fashion at the base of the penis.  A Dressing was applied.  The procedure was terminated.  The patient tolerated the procedure well.  There were no immediate periprocedural complications.  The patient was taken to the postanesthesia care unit in stable condition.          ______________________________ Alexis Frock, MD     TM/MEDQ  D:  03/27/2017  T:  03/28/2017  Job:  [id number]

## 2017-05-15 ENCOUNTER — Encounter (HOSPITAL_COMMUNITY): Payer: Self-pay

## 2017-05-15 DIAGNOSIS — Z87891 Personal history of nicotine dependence: Secondary | ICD-10-CM | POA: Diagnosis not present

## 2017-05-15 DIAGNOSIS — R310 Gross hematuria: Secondary | ICD-10-CM | POA: Insufficient documentation

## 2017-05-15 DIAGNOSIS — R300 Dysuria: Secondary | ICD-10-CM | POA: Diagnosis present

## 2017-05-15 DIAGNOSIS — Z79899 Other long term (current) drug therapy: Secondary | ICD-10-CM | POA: Diagnosis not present

## 2017-05-15 DIAGNOSIS — I10 Essential (primary) hypertension: Secondary | ICD-10-CM | POA: Insufficient documentation

## 2017-05-15 LAB — URINALYSIS, ROUTINE W REFLEX MICROSCOPIC
Bilirubin Urine: NEGATIVE
Glucose, UA: NEGATIVE mg/dL
Ketones, ur: NEGATIVE mg/dL
Nitrite: NEGATIVE
PH: 6 (ref 5.0–8.0)
Protein, ur: NEGATIVE mg/dL
SPECIFIC GRAVITY, URINE: 1.018 (ref 1.005–1.030)

## 2017-05-15 NOTE — ED Triage Notes (Signed)
Pt arrives to ed a&ox 4; pt states today he went to the bathroom and noticed blood; pt states he noticed a foul odor to urine and it was painful to urinate; pt denies any other symptoms; pt states hx of kidney stones-Monique,RN

## 2017-05-16 ENCOUNTER — Emergency Department (HOSPITAL_COMMUNITY)
Admission: EM | Admit: 2017-05-16 | Discharge: 2017-05-16 | Disposition: A | Discharge status: Home / Self Care | Payer: Medicaid Other | Attending: Emergency Medicine | Admitting: Emergency Medicine

## 2017-05-16 DIAGNOSIS — R310 Gross hematuria: Secondary | ICD-10-CM

## 2017-05-16 NOTE — Discharge Instructions (Signed)
Keep your scheduled appointment with your urologist for recheck of hematuria that resolved today. Return to the emergency department if you have any fever, recurrence of bleeding with pain, or new concern.

## 2017-05-16 NOTE — ED Provider Notes (Signed)
[facility] EMERGENCY DEPARTMENT Provider Note   CSN: [id number] Arrival date & time: 05/15/17  1845     History   Chief Complaint Chief Complaint  Patient presents with  . Hematuria    HPI Reginald Campbell is a 41 y.o. male.  Patient with history of kidney stones, HTN presents with hematuria. He first noticed it this morning and at that time it was associated with dysuria. No fever, groin pain, abdominal of flank pain at any time. The dysuria resolved immediately and he reports the hematuria has improved throughout the day. On last urination while in the ED he states there was no blood visualized.    The history is provided by the patient. No language interpreter was used.    Past Medical History:  Diagnosis Date  . Back pain   . BRBPR (bright red blood per rectum) 04/07/2015  . Depression   . History of kidney stones   . Hypertension    hx of elevated bp, no meds for htn  . Phimosis   . Sleep apnea    does not use cpap due to broke    Patient Active Problem List   Diagnosis Date Noted  . Internal hemorrhoids 03/08/2017  . Rectal bleeding 03/07/2017  . 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 Morbid obesity ([place]) 10/02/2016  . Lumbar radiculopathy, chronic 10/01/2016  . MDD (major depressive disorder) 10/01/2016  . Bilateral shoulder pain 08/18/2015  . Nephrolithiasis 04/18/2015  . Healthcare maintenance 04/07/2015  . Family history of diabetes mellitus 04/07/2015  . Obstructive sleep apnea 04/07/2015    Past Surgical History:  Procedure Laterality Date  . CIRCUMCISION N/A 03/27/2017   Procedure: CIRCUMCISION ADULT with canal block;  Surgeon: Alexis Frock, MD;  Location: [facility] ORS;  Service: Urology;  Laterality: N/A;  . NO PAST SURGERIES         Home Medications    Prior to Admission medications   Medication Sig Start Date End Date Taking? Authorizing Provider  hydrocortisone (ANUSOL-HC) 25 MG suppository Use 1 suppository twice  daily for 7 days. 03/08/17   Zehr, Laban Emperor, PA-C  naproxen (NAPROSYN) 500 MG tablet Take 1 tablet (500 mg total) by mouth 2 (two) times daily with a meal. 03/17/17   Sherwood Gambler, MD  senna-docusate (SENOKOT-S) 8.6-50 MG tablet Take 1 tablet by mouth 2 (two) times daily. While taking strong pain meds to prevent constipation. 03/27/17   Alexis Frock, MD  traMADol (ULTRAM) 50 MG tablet Take 1-2 tablets (50-100 mg total) by mouth every 6 (six) hours as needed for moderate pain or severe pain. Post-operatively. 03/27/17   Alexis Frock, MD    Family History Family History  Problem Relation Age of Onset  . Heart disease Mother   . Diabetes Father   . Hypertension Father   . Cirrhosis Father   . Heart disease Maternal Grandfather   . Melanoma Paternal Grandmother   . Lung cancer Paternal Uncle   . Lung cancer Paternal Aunt   . Asthma Daughter   . Gallbladder disease Daughter   . Erythema nodosum Daughter     Social History Social History  Substance Use Topics  . Smoking status: Former Smoker    Packs/day: 1.00    Years: 1.00    Quit date: 07/30/1994  . Smokeless tobacco: Never Used  . Alcohol use No     Allergies   Penicillins   Review of Systems Review of Systems  Constitutional: Negative for chills and fever.  Gastrointestinal:  Negative.  Negative for abdominal pain, nausea and vomiting.  Genitourinary: Positive for dysuria and hematuria. Negative for discharge, scrotal swelling and testicular pain.       See HPI.  Musculoskeletal: Negative.   Neurological: Negative.      Physical Exam Updated Vital Signs BP 125/83 (BP Location: Left Arm)   Pulse 72   Temp 98.1 F (36.7 C) (Oral)   Resp 20   SpO2 98%   Physical Exam  Constitutional: He is oriented to person, place, and time. He appears well-developed and well-nourished.  Neck: Normal range of motion.  Pulmonary/Chest: Effort normal.  Abdominal: Soft. There is no tenderness. There is no guarding.    Morbidly obese abdomen.  Genitourinary:  Genitourinary Comments: No penile discharge. No testicular tenderness or scrotal swelling. No palpable inguinal lymphadenopathy.  Musculoskeletal: Normal range of motion.  Neurological: He is alert and oriented to person, place, and time.  Skin: Skin is warm and dry.  Psychiatric: He has a normal mood and affect.     ED Treatments / Results  Labs (all labs ordered are listed, but only abnormal results are displayed) Labs Reviewed  URINALYSIS, ROUTINE W REFLEX MICROSCOPIC - Abnormal; Notable for the following:       Result Value   Hgb urine dipstick LARGE (*)    Leukocytes, UA TRACE (*)    Bacteria, UA RARE (*)    Squamous Epithelial / LPF 0-5 (*)    All other components within normal limits    EKG  EKG Interpretation None       Radiology No results found.  Procedures Procedures (including critical care time)  Medications Ordered in ED Medications - No data to display   Initial Impression / Assessment and Plan / ED Course  I have reviewed the triage vital signs and the nursing notes.  Pertinent labs & imaging results that were available during my care of the patient were reviewed by me and considered in my medical decision making (see chart for details).     Patient with hematuria that started this morning and that improved/resolved through the day. No pain at any time. No fever.   There is no evidence of infection. VSS. History of kidney stones - likely contributing to hematuria today. He has a scheduled appointment with urology in one week and will follow up at that time for recheck. Return precautions discussed.   Final Clinical Impressions(s) / ED Diagnoses   Final diagnoses:  None   1. Hematuria, resolved.  New Prescriptions New Prescriptions   No medications on file     Charlann Lange, Hershal Coria 05/16/17 0115    Veryl Speak, MD 05/16/17 [phone]

## 2017-06-09 ENCOUNTER — Emergency Department (HOSPITAL_COMMUNITY): Payer: Medicaid Other

## 2017-06-09 ENCOUNTER — Other Ambulatory Visit: Payer: Self-pay

## 2017-06-09 ENCOUNTER — Emergency Department (HOSPITAL_COMMUNITY)
Admission: EM | Admit: 2017-06-09 | Discharge: 2017-06-09 | Disposition: A | Discharge status: Home / Self Care | Payer: Medicaid Other | Attending: Emergency Medicine | Admitting: Emergency Medicine

## 2017-06-09 ENCOUNTER — Encounter (HOSPITAL_COMMUNITY): Payer: Self-pay

## 2017-06-09 DIAGNOSIS — R6889 Other general symptoms and signs: Secondary | ICD-10-CM

## 2017-06-09 DIAGNOSIS — R05 Cough: Secondary | ICD-10-CM | POA: Insufficient documentation

## 2017-06-09 DIAGNOSIS — R0981 Nasal congestion: Secondary | ICD-10-CM | POA: Diagnosis not present

## 2017-06-09 DIAGNOSIS — Z87891 Personal history of nicotine dependence: Secondary | ICD-10-CM | POA: Diagnosis not present

## 2017-06-09 DIAGNOSIS — R5383 Other fatigue: Secondary | ICD-10-CM | POA: Insufficient documentation

## 2017-06-09 DIAGNOSIS — M791 Myalgia, unspecified site: Secondary | ICD-10-CM | POA: Insufficient documentation

## 2017-06-09 DIAGNOSIS — I10 Essential (primary) hypertension: Secondary | ICD-10-CM | POA: Diagnosis not present

## 2017-06-09 DIAGNOSIS — R112 Nausea with vomiting, unspecified: Secondary | ICD-10-CM | POA: Insufficient documentation

## 2017-06-09 LAB — BASIC METABOLIC PANEL
ANION GAP: 7 (ref 5–15)
BUN: 11 mg/dL (ref 6–20)
CALCIUM: 8.6 mg/dL — AB (ref 8.9–10.3)
CO2: 25 mmol/L (ref 22–32)
Chloride: 103 mmol/L (ref 101–111)
Creatinine, Ser: 0.95 mg/dL (ref 0.61–1.24)
GFR calc Af Amer: >60 mL/min (ref >60)
GLUCOSE: 143 mg/dL — AB (ref 65–99)
Potassium: 3.9 mmol/L (ref 3.5–5.1)
Sodium: 135 mmol/L (ref 135–145)

## 2017-06-09 LAB — CBC
HEMATOCRIT: 44.8 % (ref 39.0–52.0)
HEMOGLOBIN: 15.1 g/dL (ref 13.0–17.0)
MCH: 30.3 pg (ref 26.0–34.0)
MCHC: 33.7 g/dL (ref 30.0–36.0)
MCV: 90 fL (ref 78.0–100.0)
Platelets: 151 10*3/uL (ref 150–400)
RBC: 4.98 MIL/uL (ref 4.22–5.81)
RDW: 13.8 % (ref 11.5–15.5)
WBC: 11.5 10*3/uL — ABNORMAL HIGH (ref 4.0–10.5)

## 2017-06-09 LAB — I-STAT TROPONIN, ED: Troponin i, poc: 0 ng/mL (ref 0.00–0.08)

## 2017-06-09 MED ORDER — SODIUM CHLORIDE 0.9 % IV BOLUS (SEPSIS)
1000.0000 mL | Freq: Once | INTRAVENOUS | Status: AC
  Administered 2017-06-09: 1000 mL via INTRAVENOUS

## 2017-06-09 MED ORDER — ONDANSETRON HCL 4 MG/2ML IJ SOLN
4.0000 mg | INTRAMUSCULAR | Status: AC
  Administered 2017-06-09: 4 mg via INTRAVENOUS
  Filled 2017-06-09: qty 2

## 2017-06-09 MED ORDER — ONDANSETRON HCL 4 MG PO TABS: 4.0000 mg | ORAL_TABLET | Freq: Three times a day (TID) | ORAL | 0 refills | Status: DC | PRN

## 2017-06-09 NOTE — ED Provider Notes (Signed)
[place] EMERGENCY DEPARTMENT Provider Note   CSN: [id number] Arrival date & time: 06/09/17  1305     History   Chief Complaint No chief complaint on file.   HPI Reginald Campbell is a 41 y.o. male  Who presents the emergency department with chief complaint of flulike symptoms.  Patient states that yesterday he began having a cough, nasal congestion, fatigue and myalgias.  Overnight he became nauseated and had several episodes of vomiting.  His last episode of vomiting was this morning at 4:30 AM.  He states that the vomiting is nonbloody and nonbilious.  He denies any diarrhea, abdominal pain.  He has no other contributing past medical history.  Patient denies fevers or chills.  HPI  Past Medical History:  Diagnosis Date  . Back pain   . BRBPR (bright red blood per rectum) 04/07/2015  . Depression   . History of kidney stones   . Hypertension    hx of elevated bp, no meds for htn  . Phimosis   . Sleep apnea    does not use cpap due to broke    Patient Active Problem List   Diagnosis Date Noted  . Internal hemorrhoids 03/08/2017  . Rectal bleeding 03/07/2017  . 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 Morbid obesity ([place]) 10/02/2016  . Lumbar radiculopathy, chronic 10/01/2016  . MDD (major depressive disorder) 10/01/2016  . Bilateral shoulder pain 08/18/2015  . Nephrolithiasis 04/18/2015  . Healthcare maintenance 04/07/2015  . Family history of diabetes mellitus 04/07/2015  . Obstructive sleep apnea 04/07/2015    Past Surgical History:  Procedure Laterality Date  . NO PAST SURGERIES         Home Medications    Prior to Admission medications   Medication Sig Start Date End Date Taking? Authorizing Provider  hydrocortisone (ANUSOL-HC) 25 MG suppository Use 1 suppository twice daily for 7 days. Patient not taking: Reported on 05/16/2017 03/08/17   Zehr, Laban Emperor, PA-C  naproxen (NAPROSYN) 500 MG tablet Take 1 tablet (500 mg total) by mouth  2 (two) times daily with a meal. Patient not taking: Reported on 05/16/2017 03/17/17   Sherwood Gambler, MD  senna-docusate (SENOKOT-S) 8.6-50 MG tablet Take 1 tablet by mouth 2 (two) times daily. While taking strong pain meds to prevent constipation. Patient not taking: Reported on 05/16/2017 03/27/17   Alexis Frock, MD  traMADol (ULTRAM) 50 MG tablet Take 1-2 tablets (50-100 mg total) by mouth every 6 (six) hours as needed for moderate pain or severe pain. Post-operatively. Patient not taking: Reported on 05/16/2017 03/27/17   Alexis Frock, MD    Family History Family History  Problem Relation Age of Onset  . Heart disease Mother   . Diabetes Father   . Hypertension Father   . Cirrhosis Father   . Heart disease Maternal Grandfather   . Melanoma Paternal Grandmother   . Lung cancer Paternal Uncle   . Lung cancer Paternal Aunt   . Asthma Daughter   . Gallbladder disease Daughter   . Erythema nodosum Daughter     Social History Social History   Tobacco Use  . Smoking status: Former Smoker    Packs/day: 1.00    Years: 1.00    Pack years: 1.00    Last attempt to quit: 07/30/1994    Years since quitting: 22.8  . Smokeless tobacco: Never Used  Substance Use Topics  . Alcohol use: No    Alcohol/week: 0.0 oz  . Drug use: No  Allergies   Penicillins   Review of Systems Review of Systems  Ten systems reviewed and are negative for acute change, except as noted in the HPI.    Physical Exam Updated Vital Signs BP 140/83   Pulse 93   Temp 98.7 F (37.1 C) (Oral)   Resp 18   Ht 5\' 10"  (1.778 m)   Wt (!) 162.8 kg (359 lb)   SpO2 94%   BMI 51.51 kg/m   Physical Exam  Constitutional: He is oriented to person, place, and time. He appears well-developed and well-nourished. No distress.  HENT:  Head: Normocephalic and atraumatic.  Eyes: Conjunctivae are normal. No scleral icterus.  Neck: Normal range of motion. Neck supple.  Cardiovascular: Normal rate, regular  rhythm and normal heart sounds.  Pulmonary/Chest: Effort normal and breath sounds normal. No respiratory distress.  Abdominal: Soft. There is no tenderness.  Musculoskeletal: Normal range of motion. He exhibits no edema.  Neurological: He is alert and oriented to person, place, and time.  Skin: Skin is warm and dry. He is not diaphoretic.  Psychiatric: His behavior is normal.  Nursing note and vitals reviewed.    ED Treatments / Results  Labs (all labs ordered are listed, but only abnormal results are displayed) Labs Reviewed  BASIC METABOLIC PANEL - Abnormal; Notable for the following components:      Result Value   Glucose, Bld 143 (*)    Calcium 8.6 (*)    All other components within normal limits  CBC - Abnormal; Notable for the following components:   WBC 11.5 (*)    All other components within normal limits  I-STAT TROPONIN, ED    EKG  EKG Interpretation  Date/Time:  Sunday June 09 2017 13:07:54 EST Ventricular Rate:  101 PR Interval:  154 QRS Duration: 94 QT Interval:  342 QTC Calculation: 443 R Axis:   87 Text Interpretation:  Sinus tachycardia Nonspecific ST abnormality Abnormal ECG No STEMI. Similar to prior Confirmed by Nanda Quinton [phone]) on 06/09/2017 1:12:15 PM       Radiology Dg Chest 2 View  Result Date: 06/09/2017 CLINICAL DATA:  Cough, chest pain EXAM: CHEST  2 VIEW COMPARISON:  06/29/2013 FINDINGS: Normal mediastinum and cardiac silhouette. Normal pulmonary vasculature. No evidence of effusion, infiltrate, or pneumothorax. No acute bony abnormality. IMPRESSION: No acute cardiopulmonary process. Electronically Signed   By: Suzy Bouchard M.D.   On: 06/09/2017 13:47    Procedures Procedures (including critical care time)  Medications Ordered in ED Medications  ondansetron (ZOFRAN) injection 4 mg (4 mg Intravenous Given 06/09/17 1502)  sodium chloride 0.9 % bolus 1,000 mL (1,000 mLs Intravenous New Bag/Given 06/09/17 1502)     Initial  Impression / Assessment and Plan / ED Course  I have reviewed the triage vital signs and the nursing notes.  Pertinent labs & imaging results that were available during my care of the patient were reviewed by me and considered in my medical decision making (see chart for details).     Pt CXR negative for acute infiltrate. Patients symptoms are consistent with URI, likely viral etiology. Discussed that antibiotics are not indicated for viral infections. Pt will be discharged with symptomatic treatment.  Verbalizes understanding and is agreeable with plan. Pt is hemodynamically stable & in NAD prior to dc.   Final Clinical Impressions(s) / ED Diagnoses   Final diagnoses:  Flu-like symptoms    ED Discharge Orders    None       Margarita Mail, PA-C  06/09/17 [place], MD 06/09/17 2149

## 2017-06-09 NOTE — ED Notes (Signed)
Pt stable, ambulatory, states understanding of discharge instructions 

## 2017-06-09 NOTE — Discharge Instructions (Signed)
You appear to have an upper respiratory infection (URI). An upper respiratory tract infection, or cold, is a viral infection of the air passages leading to the lungs. It is contagious and can be spread to others, especially during the first 3 or 4 days. It cannot be cured by antibiotics or other medicines. °RETURN IMMEDIATELY IF you develop shortness of breath, confusion or altered mental status, a new rash, become dizzy, faint, or poorly responsive, or are unable to be cared for at home. ° °

## 2017-06-09 NOTE — ED Triage Notes (Signed)
Patient complains of cough and congestion x 1 day, now has right sided CP with inspiration and vomiting this am, alert and oriented, NAD

## 2017-06-17 ENCOUNTER — Other Ambulatory Visit: Payer: Self-pay

## 2017-06-17 ENCOUNTER — Emergency Department (HOSPITAL_BASED_OUTPATIENT_CLINIC_OR_DEPARTMENT_OTHER)
Admission: EM | Admit: 2017-06-17 | Discharge: 2017-06-17 | Disposition: A | Discharge status: Home / Self Care | Payer: Medicaid Other | Attending: Emergency Medicine | Admitting: Emergency Medicine

## 2017-06-17 DIAGNOSIS — I10 Essential (primary) hypertension: Secondary | ICD-10-CM | POA: Diagnosis not present

## 2017-06-17 DIAGNOSIS — J020 Streptococcal pharyngitis: Secondary | ICD-10-CM | POA: Insufficient documentation

## 2017-06-17 DIAGNOSIS — Z87891 Personal history of nicotine dependence: Secondary | ICD-10-CM | POA: Insufficient documentation

## 2017-06-17 DIAGNOSIS — R05 Cough: Secondary | ICD-10-CM | POA: Diagnosis present

## 2017-06-17 LAB — RAPID STREP SCREEN (MED CTR MEBANE ONLY): Streptococcus, Group A Screen (Direct): POSITIVE — AB

## 2017-06-17 MED ORDER — CLINDAMYCIN HCL 300 MG PO CAPS: 300.0000 mg | ORAL_CAPSULE | Freq: Three times a day (TID) | ORAL | 0 refills | Status: DC

## 2017-06-17 MED FILL — CLINDAMYCIN HCL 300 MG CAPS: 300 | 10 days supply | Qty: 30 | Fill #0

## 2017-06-17 NOTE — ED Provider Notes (Signed)
[facility] EMERGENCY DEPARTMENT Provider Note   CSN: [id number] Arrival date & time: 06/17/17  1002  History   Chief Complaint Chief Complaint  Patient presents with  . Cough    HPI Reginald Campbell is a 41 y.o. male.  HPI   Patient here for evaluation of cough for 1 month and sore throat for the past few days. His daughter is also here to be seen. She was treated for strep throat recently requiring abx. He says that he only coughs because his throat is sore. He has been taking throat lozenges for pain. Tylenol/Ibuprofen doesn't help. He has not had any fevers.  He denies N/V/D, weakness, headache, confusion, rash, CP, difficulty breathing, unilateral throat pain.  Past Medical History:  Diagnosis Date  . Back pain   . BRBPR (bright red blood per rectum) 04/07/2015  . Depression   . History of kidney stones   . Hypertension    hx of elevated bp, no meds for htn  . Phimosis   . Sleep apnea    does not use cpap due to broke    Patient Active Problem List   Diagnosis Date Noted  . Internal hemorrhoids 03/08/2017  . Rectal bleeding 03/07/2017  . 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 Morbid obesity ([place]) 10/02/2016  . Lumbar radiculopathy, chronic 10/01/2016  . MDD (major depressive disorder) 10/01/2016  . Bilateral shoulder pain 08/18/2015  . Nephrolithiasis 04/18/2015  . Healthcare maintenance 04/07/2015  . Family history of diabetes mellitus 04/07/2015  . Obstructive sleep apnea 04/07/2015    Past Surgical History:  Procedure Laterality Date  . CIRCUMCISION ADULT with canal block N/A 03/27/2017   Performed by Alexis Frock, MD at [facility] ORS  . NO PAST SURGERIES       Home Medications    Prior to Admission medications   Medication Sig Start Date End Date Taking? Authorizing Provider  clindamycin (CLEOCIN) 300 MG capsule Take 1 capsule (300 mg total) 3 (three) times daily by mouth. 06/17/17   Carlota Raspberry, Christopherjame Carnell, PA-C  hydrocortisone (ANUSOL-HC) 25 MG  suppository Use 1 suppository twice daily for 7 days. Patient not taking: Reported on 05/16/2017 03/08/17   Zehr, Laban Emperor, PA-C  naproxen (NAPROSYN) 500 MG tablet Take 1 tablet (500 mg total) by mouth 2 (two) times daily with a meal. Patient not taking: Reported on 05/16/2017 03/17/17   Sherwood Gambler, MD  ondansetron (ZOFRAN) 4 MG tablet Take 1 tablet (4 mg total) every 8 (eight) hours as needed by mouth for nausea or vomiting. 06/09/17   Harris, Abigail, PA-C  senna-docusate (SENOKOT-S) 8.6-50 MG tablet Take 1 tablet by mouth 2 (two) times daily. While taking strong pain meds to prevent constipation. Patient not taking: Reported on 05/16/2017 03/27/17   Alexis Frock, MD  traMADol (ULTRAM) 50 MG tablet Take 1-2 tablets (50-100 mg total) by mouth every 6 (six) hours as needed for moderate pain or severe pain. Post-operatively. Patient not taking: Reported on 05/16/2017 03/27/17   Alexis Frock, MD    Family History Family History  Problem Relation Age of Onset  . Heart disease Mother   . Diabetes Father   . Hypertension Father   . Cirrhosis Father   . Heart disease Maternal Grandfather   . Melanoma Paternal Grandmother   . Lung cancer Paternal Uncle   . Lung cancer Paternal Aunt   . Asthma Daughter   . Gallbladder disease Daughter   . Erythema nodosum Daughter     Social History Social History  Tobacco Use  . Smoking status: Former Smoker    Packs/day: 1.00    Years: 1.00    Pack years: 1.00    Last attempt to quit: 07/30/1994    Years since quitting: 22.8  . Smokeless tobacco: Never Used  Substance Use Topics  . Alcohol use: No    Alcohol/week: 0.0 oz  . Drug use: No     Allergies   Penicillins   Review of Systems Review of Systems Negative ROS aside from pertinent positives and negatives as listed in HPI   Physical Exam Updated Vital Signs BP 124/76 (BP Location: Left Arm)   Pulse 72   Temp 97.8 F (36.6 C) (Oral)   Resp 18   Ht 5\' 10"  (1.778 m)   Wt  (!) 159.7 kg (352 lb)   SpO2 98%   BMI 50.51 kg/m   Physical Exam  Constitutional: He is oriented to person, place, and time. He appears well-developed and well-nourished. No distress.  HENT:  Head: Normocephalic and atraumatic.  Right Ear: Tympanic membrane, external ear and ear canal normal.  Left Ear: Tympanic membrane, external ear and ear canal normal.  Nose: Nose normal. No rhinorrhea. Right sinus exhibits no maxillary sinus tenderness and no frontal sinus tenderness. Left sinus exhibits no maxillary sinus tenderness and no frontal sinus tenderness.  Mouth/Throat: Uvula is midline and mucous membranes are normal. No trismus in the jaw. Normal dentition. No dental abscesses or uvula swelling. Oropharyngeal exudate and posterior oropharyngeal edema present. No posterior oropharyngeal erythema or tonsillar abscesses.  No submental edema, tongue not elevated, no trismus. No impending airway obstruction; Pt able to speak full sentences, swallow intact, no drooling, stridor, or tonsillar/uvula displacement. No palatal petechia  Eyes: Conjunctivae are normal.  Neck: Trachea normal, normal range of motion and full passive range of motion without pain. Neck supple. No neck rigidity. Normal range of motion present. No Brudzinski's sign noted.  Flexion and extension of neck without pain or difficulty. Able to breath without difficulty in extension.  Cardiovascular: Normal rate and regular rhythm.  Pulmonary/Chest: Effort normal and breath sounds normal. No stridor. No respiratory distress. He has no wheezes.  Abdominal: Soft. There is no tenderness.  No obvious evidence of splenomegaly. Non ttp.   Musculoskeletal: Normal range of motion.  Lymphadenopathy:       Head (right side): No preauricular and no posterior auricular adenopathy present.       Head (left side): No preauricular and no posterior auricular adenopathy present.    He has cervical adenopathy.  Neurological: He is alert and  oriented to person, place, and time.  Skin: Skin is warm and dry. No rash noted. He is not diaphoretic.  Psychiatric: He has a normal mood and affect.  Nursing note and vitals reviewed.    ED Treatments / Results  Labs (all labs ordered are listed, but only abnormal results are displayed) Labs Reviewed  RAPID STREP SCREEN (NOT AT [facility]) - Abnormal; Notable for the following components:      Result Value   Streptococcus, Group A Screen (Direct) POSITIVE (*)    All other components within normal limits    EKG  EKG Interpretation None       Radiology No results found.  Procedures Procedures (including critical care time)  Medications Ordered in ED Medications - No data to display   Initial Impression / Assessment and Plan / ED Course  I have reviewed the triage vital signs and the nursing notes.  Pertinent labs &  imaging results that were available during my care of the patient were reviewed by me and considered in my medical decision making (see chart for details).    Patient appears moderately ill. Temp as noted above. Exudative pharyngo-tonsillitis is noted. Anterior cervical nodes are present.  Ears are normal, chest is clear.  Rapid strep test is positive. No rashes. No hepatosplenomegaly. Penicillin allergy, will rx for Clindamycin per UpToDate recommendations.  Blood pressure 124/76, pulse 72, temperature 97.8 F (36.6 C), temperature source Oral, resp. rate 18, height 5\' 10"  (1.778 m), weight (!) 159.7 kg (352 lb), SpO2 98 %.  [name] has been evaluated today in the emergency department. The appropriate screening and testing was been performed and I believe the patient to be medically stable for discharge.   Return signs and symptoms have been discussed with the patient and/or caregivers and they have voiced their understanding. The patient has agreed to follow-up with their primary care provider or the referred specialist.    Final Clinical  Impressions(s) / ED Diagnoses   Final diagnoses:  Strep throat    ED Discharge Orders        Ordered    clindamycin (CLEOCIN) 300 MG capsule  3 times daily     06/17/17 1147       Delos Haring, PA-C 06/17/17 1150    Pattricia Boss, MD 06/17/17 1247

## 2017-06-17 NOTE — ED Triage Notes (Signed)
Pt reports cough x 1 month off and on with congestion, denies fevers.

## 2017-09-24 ENCOUNTER — Other Ambulatory Visit: Payer: Self-pay | Admitting: Urology

## 2017-09-30 NOTE — Progress Notes (Signed)
EKG-06/14/17-epic  CXR-epic-05/2017-epic

## 2017-09-30 NOTE — Patient Instructions (Addendum)
[name]  09/30/2017   Your procedure is scheduled on: 10/04/2017   Report to [facility] Main  Entrance  Report to admitting at   1245pm    Call this number if you have problems the morning of surgery [phone]   Remember: Do not eat food after midnite.  May have clear liquids from 12 midnite until 0830am morning of surgery then nothing by mouth.      Take these medicines the morning of surgery with A SIP OF WATER:  None                                 You may not have any metal on your body including hair pins and              piercings  Do not wear jewelry,  lotions, powders or perfumes, deodorant             Do not wear nail polish.  Do not shave  48 hours prior to surgery.              Men may shave face and neck.   Do not bring valuables to the hospital. Reginald Campbell.  Contacts, dentures or bridgework may not be worn into surgery.      Patients discharged the day of surgery will not be allowed to drive home.  Name and phone number of your driver:                Please read over the following fact sheets you were given: _____________________________________________________________________             [facility] - Preparing for Surgery Before surgery, you can play an important role.  Because skin is not sterile, your skin needs to be as free of germs as possible.  You can reduce the number of germs on your skin by washing with CHG (chlorahexidine gluconate) soap before surgery.  CHG is an antiseptic cleaner which kills germs and bonds with the skin to continue killing germs even after washing. Please DO NOT use if you have an allergy to CHG or antibacterial soaps.  If your skin becomes reddened/irritated stop using the CHG and inform your nurse when you arrive at Short Stay. Do not shave (including legs and underarms) for at least 48 hours prior to the first CHG shower.  You may shave your  face/neck. Please follow these instructions carefully:  1.  Shower with CHG Soap the night before surgery and the  morning of Surgery.  2.  If you choose to wash your hair, wash your hair first as usual with your  normal  shampoo.  3.  After you shampoo, rinse your hair and body thoroughly to remove the  shampoo.                           4.  Use CHG as you would any other liquid soap.  You can apply chg directly  to the skin and wash                       Gently with a scrungie or clean washcloth.  5.  Apply  the CHG Soap to your body ONLY FROM THE NECK DOWN.   Do not use on face/ open                           Wound or open sores. Avoid contact with eyes, ears mouth and genitals (private parts).                       Wash face,  Genitals (private parts) with your normal soap.             6.  Wash thoroughly, paying special attention to the area where your surgery  will be performed.  7.  Thoroughly rinse your body with warm water from the neck down.  8.  DO NOT shower/wash with your normal soap after using and rinsing off  the CHG Soap.                9.  Pat yourself dry with a clean towel.            10.  Wear clean pajamas.            11.  Place clean sheets on your bed the night of your first shower and do not  sleep with pets. Day of Surgery : Do not apply any lotions/deodorants the morning of surgery.  Please wear clean clothes to the hospital/surgery center.  FAILURE TO FOLLOW THESE INSTRUCTIONS MAY RESULT IN THE CANCELLATION OF YOUR SURGERY PATIENT SIGNATURE_________________________________  NURSE SIGNATURE__________________________________  ________________________________________________________________________    CLEAR LIQUID DIET   Foods Allowed                                                                     Foods Excluded  Coffee and tea, regular and decaf                             liquids that you cannot  Plain Jell-O in any flavor                                              see through such as: Fruit ices (not with fruit pulp)                                     milk, soups, orange juice  Iced Popsicles                                    All solid food Carbonated beverages, regular and diet                                    Cranberry, grape and apple juices Sports drinks like Gatorade Lightly seasoned clear broth or consume(fat free) Sugar, honey syrup  Sample Menu Breakfast  Lunch                                     Supper Cranberry juice                    Beef broth                            Chicken broth Jell-O                                     Grape juice                           Apple juice Coffee or tea                        Jell-O                                      Popsicle                                                Coffee or tea                        Coffee or tea  _____________________________________________________________________

## 2017-10-01 ENCOUNTER — Encounter (HOSPITAL_COMMUNITY)
Admission: RE | Admit: 2017-10-01 | Discharge: 2017-10-01 | Disposition: A | Discharge status: Home / Self Care | Payer: Medicaid Other | Source: Ambulatory Visit | Attending: Urology | Admitting: Urology

## 2017-10-01 ENCOUNTER — Encounter (HOSPITAL_COMMUNITY): Payer: Self-pay

## 2017-10-01 ENCOUNTER — Other Ambulatory Visit: Payer: Self-pay

## 2017-10-01 DIAGNOSIS — Z01812 Encounter for preprocedural laboratory examination: Secondary | ICD-10-CM | POA: Insufficient documentation

## 2017-10-01 HISTORY — DX: Unspecified asthma, uncomplicated: J45.909

## 2017-10-01 HISTORY — DX: Unspecified osteoarthritis, unspecified site: M19.90

## 2017-10-01 HISTORY — DX: Family history of other specified conditions: Z84.89

## 2017-10-01 LAB — BASIC METABOLIC PANEL
Anion gap: 6 (ref 5–15)
BUN: 14 mg/dL (ref 6–20)
CO2: 28 mmol/L (ref 22–32)
CREATININE: 0.97 mg/dL (ref 0.61–1.24)
Calcium: 8.7 mg/dL — ABNORMAL LOW (ref 8.9–10.3)
Chloride: 106 mmol/L (ref 101–111)
GFR calc Af Amer: >60 mL/min (ref >60)
GLUCOSE: 144 mg/dL — AB (ref 65–99)
Potassium: 4.3 mmol/L (ref 3.5–5.1)
Sodium: 140 mmol/L (ref 135–145)

## 2017-10-01 LAB — CBC
HCT: 44.9 % (ref 39.0–52.0)
Hemoglobin: 14.9 g/dL (ref 13.0–17.0)
MCH: 30 pg (ref 26.0–34.0)
MCHC: 33.2 g/dL (ref 30.0–36.0)
MCV: 90.3 fL (ref 78.0–100.0)
PLATELETS: 166 10*3/uL (ref 150–400)
RBC: 4.97 MIL/uL (ref 4.22–5.81)
RDW: 14.1 % (ref 11.5–15.5)
WBC: 7 10*3/uL (ref 4.0–10.5)

## 2017-10-04 ENCOUNTER — Encounter (HOSPITAL_COMMUNITY)
Admission: RE | Disposition: A | Discharge status: Home / Self Care | Payer: Self-pay | Source: Ambulatory Visit | Attending: Urology

## 2017-10-04 ENCOUNTER — Ambulatory Visit (HOSPITAL_COMMUNITY): Payer: Medicaid Other | Admitting: Certified Registered Nurse Anesthetist

## 2017-10-04 ENCOUNTER — Other Ambulatory Visit: Payer: Self-pay

## 2017-10-04 ENCOUNTER — Ambulatory Visit (HOSPITAL_COMMUNITY)
Admission: RE | Admit: 2017-10-04 | Discharge: 2017-10-04 | Disposition: A | Discharge status: Home / Self Care | Payer: Medicaid Other | Source: Ambulatory Visit | Attending: Urology | Admitting: Urology

## 2017-10-04 ENCOUNTER — Encounter (HOSPITAL_COMMUNITY): Payer: Self-pay | Admitting: *Deleted

## 2017-10-04 DIAGNOSIS — N478 Other disorders of prepuce: Secondary | ICD-10-CM | POA: Diagnosis present

## 2017-10-04 DIAGNOSIS — Z87892 Personal history of anaphylaxis: Secondary | ICD-10-CM | POA: Insufficient documentation

## 2017-10-04 DIAGNOSIS — Z6841 Body Mass Index (BMI) 40.0 and over, adult: Secondary | ICD-10-CM | POA: Diagnosis not present

## 2017-10-04 DIAGNOSIS — Z880 Allergy status to penicillin: Secondary | ICD-10-CM | POA: Diagnosis not present

## 2017-10-04 DIAGNOSIS — G4730 Sleep apnea, unspecified: Secondary | ICD-10-CM | POA: Diagnosis not present

## 2017-10-04 DIAGNOSIS — N471 Phimosis: Secondary | ICD-10-CM | POA: Insufficient documentation

## 2017-10-04 DIAGNOSIS — Z87891 Personal history of nicotine dependence: Secondary | ICD-10-CM | POA: Insufficient documentation

## 2017-10-04 DIAGNOSIS — Z791 Long term (current) use of non-steroidal anti-inflammatories (NSAID): Secondary | ICD-10-CM | POA: Diagnosis not present

## 2017-10-04 DIAGNOSIS — Z87442 Personal history of urinary calculi: Secondary | ICD-10-CM | POA: Insufficient documentation

## 2017-10-04 DIAGNOSIS — F419 Anxiety disorder, unspecified: Secondary | ICD-10-CM | POA: Diagnosis not present

## 2017-10-04 DIAGNOSIS — Z8249 Family history of ischemic heart disease and other diseases of the circulatory system: Secondary | ICD-10-CM | POA: Insufficient documentation

## 2017-10-04 DIAGNOSIS — Z79899 Other long term (current) drug therapy: Secondary | ICD-10-CM | POA: Diagnosis not present

## 2017-10-04 DIAGNOSIS — I10 Essential (primary) hypertension: Secondary | ICD-10-CM | POA: Insufficient documentation

## 2017-10-04 DIAGNOSIS — Z833 Family history of diabetes mellitus: Secondary | ICD-10-CM | POA: Diagnosis not present

## 2017-10-04 DIAGNOSIS — F329 Major depressive disorder, single episode, unspecified: Secondary | ICD-10-CM | POA: Insufficient documentation

## 2017-10-04 HISTORY — PX: CIRCUMCISION REVISION: SHX6634

## 2017-10-04 SURGERY — REVISION, CIRCUMCISION
Anesthesia: General

## 2017-10-04 MED ORDER — BACITRACIN ZINC 500 UNIT/GM EX OINT
TOPICAL_OINTMENT | CUTANEOUS | Status: AC
  Filled 2017-10-04: qty 28.35

## 2017-10-04 MED ORDER — TRAMADOL HCL 50 MG PO TABS: 50.0000 mg | ORAL_TABLET | Freq: Four times a day (QID) | ORAL | 0 refills | Status: DC | PRN

## 2017-10-04 MED ORDER — BUPIVACAINE HCL (PF) 0.5 % IJ SOLN
INTRAMUSCULAR | Status: AC
  Filled 2017-10-04: qty 30

## 2017-10-04 MED ORDER — PROMETHAZINE HCL 25 MG/ML IJ SOLN: 6.2500 mg | INTRAMUSCULAR | Status: DC | PRN

## 2017-10-04 MED ORDER — OXYCODONE HCL 5 MG/5ML PO SOLN
5.0000 mg | Freq: Once | ORAL | Status: AC | PRN
  Filled 2017-10-04: qty 5

## 2017-10-04 MED ORDER — LIDOCAINE 2% (20 MG/ML) 5 ML SYRINGE
INTRAMUSCULAR | Status: DC | PRN
  Administered 2017-10-04: 100 mg via INTRAVENOUS

## 2017-10-04 MED ORDER — FENTANYL CITRATE (PF) 100 MCG/2ML IJ SOLN: 25.0000 ug | INTRAMUSCULAR | Status: DC | PRN

## 2017-10-04 MED ORDER — LACTATED RINGERS IV SOLN
INTRAVENOUS | Status: DC
  Administered 2017-10-04: 13:00:00 via INTRAVENOUS

## 2017-10-04 MED ORDER — LIDOCAINE 2% (20 MG/ML) 5 ML SYRINGE
INTRAMUSCULAR | Status: AC
  Filled 2017-10-04: qty 5

## 2017-10-04 MED ORDER — CLINDAMYCIN PHOSPHATE 900 MG/50ML IV SOLN
INTRAVENOUS | Status: AC
  Filled 2017-10-04: qty 50

## 2017-10-04 MED ORDER — MIDAZOLAM HCL 5 MG/5ML IJ SOLN
INTRAMUSCULAR | Status: DC | PRN
  Administered 2017-10-04: 2 mg via INTRAVENOUS

## 2017-10-04 MED ORDER — MIDAZOLAM HCL 2 MG/2ML IJ SOLN
INTRAMUSCULAR | Status: AC
  Filled 2017-10-04: qty 2

## 2017-10-04 MED ORDER — BUPIVACAINE-EPINEPHRINE (PF) 0.5% -1:200000 IJ SOLN
INTRAMUSCULAR | Status: AC
  Filled 2017-10-04: qty 30

## 2017-10-04 MED ORDER — OXYCODONE HCL 5 MG PO TABS
ORAL_TABLET | ORAL | Status: AC
  Filled 2017-10-04: qty 1

## 2017-10-04 MED ORDER — 0.9 % SODIUM CHLORIDE (POUR BTL) OPTIME
TOPICAL | Status: DC | PRN
  Administered 2017-10-04: 1000 mL

## 2017-10-04 MED ORDER — ROCURONIUM BROMIDE 10 MG/ML (PF) SYRINGE
PREFILLED_SYRINGE | INTRAVENOUS | Status: AC
  Filled 2017-10-04: qty 5

## 2017-10-04 MED ORDER — FENTANYL CITRATE (PF) 100 MCG/2ML IJ SOLN
INTRAMUSCULAR | Status: DC | PRN
  Administered 2017-10-04 (×2): 50 ug via INTRAVENOUS

## 2017-10-04 MED ORDER — SENNOSIDES-DOCUSATE SODIUM 8.6-50 MG PO TABS: 1.0000 | ORAL_TABLET | Freq: Two times a day (BID) | ORAL | 0 refills | Status: DC

## 2017-10-04 MED ORDER — CLINDAMYCIN PHOSPHATE 900 MG/50ML IV SOLN
900.0000 mg | INTRAVENOUS | Status: AC
  Administered 2017-10-04: 900 mg via INTRAVENOUS

## 2017-10-04 MED ORDER — KETAMINE HCL 10 MG/ML IJ SOLN
INTRAMUSCULAR | Status: AC
  Filled 2017-10-04: qty 1

## 2017-10-04 MED ORDER — BUPIVACAINE HCL (PF) 0.5 % IJ SOLN
INTRAMUSCULAR | Status: DC | PRN
  Administered 2017-10-04: 10 mL

## 2017-10-04 MED ORDER — DEXAMETHASONE SODIUM PHOSPHATE 10 MG/ML IJ SOLN
INTRAMUSCULAR | Status: AC
  Filled 2017-10-04: qty 1

## 2017-10-04 MED ORDER — PROPOFOL 10 MG/ML IV BOLUS
INTRAVENOUS | Status: DC | PRN
  Administered 2017-10-04: 320 mg via INTRAVENOUS

## 2017-10-04 MED ORDER — ONDANSETRON HCL 4 MG/2ML IJ SOLN
INTRAMUSCULAR | Status: DC | PRN
  Administered 2017-10-04: 4 mg via INTRAVENOUS

## 2017-10-04 MED ORDER — ONDANSETRON HCL 4 MG/2ML IJ SOLN
INTRAMUSCULAR | Status: AC
  Filled 2017-10-04: qty 2

## 2017-10-04 MED ORDER — PROPOFOL 10 MG/ML IV BOLUS
INTRAVENOUS | Status: AC
  Filled 2017-10-04: qty 40

## 2017-10-04 MED ORDER — DEXAMETHASONE SODIUM PHOSPHATE 10 MG/ML IJ SOLN
INTRAMUSCULAR | Status: DC | PRN
  Administered 2017-10-04: 10 mg via INTRAVENOUS

## 2017-10-04 MED ORDER — OXYCODONE HCL 5 MG PO TABS
5.0000 mg | ORAL_TABLET | Freq: Once | ORAL | Status: AC | PRN
  Administered 2017-10-04: 5 mg via ORAL

## 2017-10-04 MED ORDER — FENTANYL CITRATE (PF) 250 MCG/5ML IJ SOLN
INTRAMUSCULAR | Status: AC
  Filled 2017-10-04: qty 5

## 2017-10-04 SURGICAL SUPPLY — 24 items
BANDAGE COBAN STERILE 2 (GAUZE/BANDAGES/DRESSINGS) ×3 IMPLANT
BLADE SURG 15 STRL LF DISP TIS (BLADE) ×1 IMPLANT
BLADE SURG 15 STRL SS (BLADE) ×3
BNDG CONFORM 2 STRL LF (GAUZE/BANDAGES/DRESSINGS) ×3 IMPLANT
DECANTER SPIKE VIAL GLASS SM (MISCELLANEOUS) ×2 IMPLANT
DRAPE LAPAROTOMY T 102X78X121 (DRAPES) ×3 IMPLANT
ELECT NDL TIP 2.8 STRL (NEEDLE) ×1 IMPLANT
ELECT NEEDLE TIP 2.8 STRL (NEEDLE) IMPLANT
ELECT PENCIL ROCKER SW 15FT (MISCELLANEOUS) ×3 IMPLANT
ELECT REM PT RETURN 15FT ADLT (MISCELLANEOUS) ×3 IMPLANT
GAUZE SPONGE 4X4 12PLY STRL (GAUZE/BANDAGES/DRESSINGS) ×1 IMPLANT
GAUZE SPONGE 4X4 16PLY XRAY LF (GAUZE/BANDAGES/DRESSINGS) ×1 IMPLANT
GLOVE BIOGEL M STRL SZ7.5 (GLOVE) ×3 IMPLANT
GOWN STRL REUS W/TWL LRG LVL3 (GOWN DISPOSABLE) ×6 IMPLANT
HOVERMATT SINGLE USE (MISCELLANEOUS) ×2 IMPLANT
KIT BASIN OR (CUSTOM PROCEDURE TRAY) ×3 IMPLANT
NEEDLE HYPO 25X1 1.5 SAFETY (NEEDLE) IMPLANT
NS IRRIG 1000ML POUR BTL (IV SOLUTION) IMPLANT
PACK BASIC VI WITH GOWN DISP (CUSTOM PROCEDURE TRAY) ×3 IMPLANT
SUT CHROMIC 4 0 P 3 18 (SUTURE) IMPLANT
SUT CHROMIC 4 0 PS 2 18 (SUTURE) IMPLANT
SYR CONTROL 10ML LL (SYRINGE) ×3 IMPLANT
TOWEL OR 17X26 10 PK STRL BLUE (TOWEL DISPOSABLE) ×3 IMPLANT
WATER STERILE IRR 1000ML POUR (IV SOLUTION) IMPLANT

## 2017-10-04 NOTE — Brief Op Note (Signed)
10/04/2017  3:18 PM  PATIENT:  Reginald Campbell  42 y.o. male  PRE-OPERATIVE DIAGNOSIS:  REDUNDANT FORESKIN  POST-OPERATIVE DIAGNOSIS:  REDUNDANT FORESKIN  PROCEDURE:  Procedure(s): CIRCUMCISION REVISION (N/A)  SURGEON:  Surgeon(s) and Role:    * Alexis Frock, MD - Primary  PHYSICIAN ASSISTANT:   ASSISTANTS: none   ANESTHESIA:   regional and general  EBL:  none   BLOOD ADMINISTERED:none  DRAINS: none   LOCAL MEDICATIONS USED:  MARCAINE     SPECIMEN:  Source of Specimen:  redundant foreskin  DISPOSITION OF SPECIMEN:  discard  COUNTS:  YES  TOURNIQUET:  * No tourniquets in log *  DICTATION: .Other Dictation: Dictation Number [id number]  PLAN OF CARE: Discharge to home after PACU  PATIENT DISPOSITION:  PACU - hemodynamically stable.   Delay start of Pharmacological VTE agent (>24hrs) due to surgical blood loss or risk of bleeding: yes

## 2017-10-04 NOTE — Transfer of Care (Signed)
Immediate Anesthesia Transfer of Care Note  Patient: Reginald Campbell  Procedure(s) Performed: CIRCUMCISION REVISION (N/A )  Patient Location: PACU  Anesthesia Type:General  Level of Consciousness: alert  and drowsy patient cooperative.  Airway & Oxygen Therapy: Patient Spontanous Breathing and Patient connected to face mask oxygen  Post-op Assessment: Report given to RN and Post -op Vital signs reviewed and stable  Post vital signs: Reviewed and stable  Last Vitals:  Vitals:   10/04/17 1227 10/04/17 1530  BP: (!) 139/94   Pulse: 87   Resp: 16   Temp: 36.9 C (P) 36.8 C  SpO2: 96%     Last Pain:  Vitals:   10/04/17 1227  TempSrc: Oral      Patients Stated Pain Goal: 2 (58/72/76 1848)  Complications: No apparent anesthesia complications

## 2017-10-04 NOTE — Anesthesia Postprocedure Evaluation (Signed)
Anesthesia Post Note  Patient: Reginald Campbell  Procedure(s) Performed: CIRCUMCISION REVISION (N/A )     Patient location during evaluation: PACU Anesthesia Type: General Level of consciousness: awake and alert Pain management: pain level controlled Vital Signs Assessment: post-procedure vital signs reviewed and stable Respiratory status: spontaneous breathing, nonlabored ventilation and respiratory function stable Cardiovascular status: blood pressure returned to baseline and stable Postop Assessment: no apparent nausea or vomiting Anesthetic complications: no    Last Vitals:  Vitals:   10/04/17 1615 10/04/17 1630  BP: 125/82 116/76  Pulse: 69 70  Resp: 13 13  Temp:    SpO2: 100% 100%    Last Pain:  Vitals:   10/04/17 1227  TempSrc: Oral                 Audry Pili

## 2017-10-04 NOTE — Anesthesia Preprocedure Evaluation (Signed)
Anesthesia Evaluation  Patient identified by MRN, date of birth, ID band Patient awake    Reviewed: Allergy & Precautions, NPO status , Patient's Chart, lab work & pertinent test results  Airway Mallampati: III  TM Distance: <3 FB Neck ROM: Full    Dental no notable dental hx. (+) Dental Advisory Given   Pulmonary asthma , sleep apnea , former smoker,    breath sounds clear to auscultation + decreased breath sounds      Cardiovascular hypertension, Normal cardiovascular exam Rhythm:Regular Rate:Normal  Untreated HTN   Neuro/Psych Depression negative neurological ROS  negative psych ROS   GI/Hepatic negative GI ROS, Neg liver ROS,   Endo/Other  Morbid obesity  Renal/GU negative Renal ROS   Phimosis    Musculoskeletal  (+) Arthritis ,   Abdominal (+) + obese,   Peds negative pediatric ROS (+)  Hematology negative hematology ROS (+)   Anesthesia Other Findings   Reproductive/Obstetrics negative OB ROS                            Anesthesia Physical  Anesthesia Plan  ASA: III  Anesthesia Plan: General   Post-op Pain Management:    Induction: Intravenous  PONV Risk Score and Plan: 2 and Ondansetron, Dexamethasone and Treatment may vary due to age or medical condition  Airway Management Planned: Oral ETT  Additional Equipment: None  Intra-op Plan:   Post-operative Plan: Extubation in OR  Informed Consent: I have reviewed the patients History and Physical, chart, labs and discussed the procedure including the risks, benefits and alternatives for the proposed anesthesia with the patient or authorized representative who has indicated his/her understanding and acceptance.   Dental advisory given  Plan Discussed with: CRNA  Anesthesia Plan Comments:        Anesthesia Quick Evaluation

## 2017-10-04 NOTE — Discharge Instructions (Signed)
1 - All stitches are dissolvable and will disappear over the next 2 weeks. No sexual stimulation until cleared by MD.   2 - Call MD or go to ER for fever >102, severe pain / nausea / vomiting not relieved by medications, or acute change in medical status      General Anesthesia, Adult, Care After These instructions provide you with information about caring for yourself after your procedure. Your health care provider may also give you more specific instructions. Your treatment has been planned according to current medical practices, but problems sometimes occur. Call your health care provider if you have any problems or questions after your procedure. What can I expect after the procedure? After the procedure, it is common to have:  Vomiting.  A sore throat.  Mental slowness.  It is common to feel:  Nauseous.  Cold or shivery.  Sleepy.  Tired.  Sore or achy, even in parts of your body where you did not have surgery.  Follow these instructions at home: For at least 24 hours after the procedure:  Do not: ? Participate in activities where you could fall or become injured. ? Drive. ? Use heavy machinery. ? Drink alcohol. ? Take sleeping pills or medicines that cause drowsiness. ? Make important decisions or sign legal documents. ? Take care of children on your own.  Rest. Eating and drinking  If you vomit, drink water, juice, or soup when you can drink without vomiting.  Drink enough fluid to keep your urine clear or pale yellow.  Make sure you have little or no nausea before eating solid foods.  Follow the diet recommended by your health care provider. General instructions  Have a responsible adult stay with you until you are awake and alert.  Return to your normal activities as told by your health care provider. Ask your health care provider what activities are safe for you.  Take over-the-counter and prescription medicines only as told by your health care  provider.  If you smoke, do not smoke without supervision.  Keep all follow-up visits as told by your health care provider. This is important. Contact a health care provider if:  You continue to have nausea or vomiting at home, and medicines are not helpful.  You cannot drink fluids or start eating again.  You cannot urinate after 8-12 hours.  You develop a skin rash.  You have fever.  You have increasing redness at the site of your procedure. Get help right away if:  You have difficulty breathing.  You have chest pain.  You have unexpected bleeding.  You feel that you are having a life-threatening or urgent problem. This information is not intended to replace advice given to you by your health care provider. Make sure you discuss any questions you have with your health care provider. Document Released: 10/22/2000 Document Revised: 12/19/2015 Document Reviewed: 06/30/2015 Elsevier Interactive Patient Education  [employer].

## 2017-10-04 NOTE — H&P (Signed)
Reginald Campbell is an 42 y.o. male.    Chief Complaint: Pre-op Revision Circumcision  HPI:   1 - Phimosis with Buried Penis - s/p circ (path benign) 02/2017 for progressive phimosis and buried penis. He does have some persistant redundant frenular tissue post-circ that is most bothersome with erection and with sitting standing as this "catches" on his prepubic fat pad. He is morbidly obese (over 350lb).   PMH sig for morbid obesity, lumbago, anxiety/depression (no meds, not limiting). His PCP is Reginald Malay MD with [facility].   Today "Reginald Campbell" is seen to proceed with revision circumcision to address redundant frenular tissue that he finds very bothersome functionally.    Past Medical History:  Diagnosis Date  . Arthritis   . Asthma   . Back pain   . BRBPR (bright red blood per rectum) 04/07/2015  . Depression   . Family history of adverse reaction to anesthesia    daughter under anesthesia for 6 hours had problems with N/V   . History of kidney stones   . Hypertension    hx of elevated bp, no meds for htn  . Phimosis   . Sleep apnea    does not use cpap due to broke    Past Surgical History:  Procedure Laterality Date  . CIRCUMCISION N/A 03/27/2017   Procedure: CIRCUMCISION ADULT with canal block;  Surgeon: Alexis Frock, MD;  Location: [facility];  Service: Urology;  Laterality: N/A;  . NO PAST SURGERIES      Family History  Problem Relation Age of Onset  . Heart disease Mother   . Diabetes Father   . Hypertension Father   . Cirrhosis Father   . Heart disease Maternal Grandfather   . Melanoma Paternal Grandmother   . Lung cancer Paternal Uncle   . Lung cancer Paternal Aunt   . Asthma Daughter   . Gallbladder disease Daughter   . Erythema nodosum Daughter    Social History:  reports that he quit smoking about 23 years ago. He has a 1.00 pack-year smoking history. he has never used smokeless tobacco. He reports that he does not drink alcohol or use drugs.  Allergies:   Allergies  Allergen Reactions  . Penicillins Anaphylaxis, Hives, Swelling and Other (See Comments)    Pt was 15 Has patient had a PCN reaction causing immediate rash, facial/tongue/throat swelling, SOB or lightheadedness with hypotension: Yes Has patient had a PCN reaction causing severe rash involving mucus membranes or skin necrosis: No Has patient had a PCN reaction that required hospitalization: Yes Has patient had a PCN reaction occurring within the last 10 years: No If all of the above answers are "NO", then may proceed with Cephalosporin use.     No medications prior to admission.    No results found for this or any previous visit (from the past 48 hour(s)). No results found.  Review of Systems  Constitutional: Negative.  Negative for chills and fever.  HENT: Negative.   Eyes: Negative.   Respiratory: Negative.   Cardiovascular: Negative.   Gastrointestinal: Negative.   Genitourinary: Negative.   Musculoskeletal: Negative.   Skin: Negative.   Neurological: Negative.   Endo/Heme/Allergies: Negative.   Psychiatric/Behavioral: The patient is nervous/anxious.     There were no vitals taken for this visit. Physical Exam  Constitutional: He appears well-developed.  HENT:  Head: Normocephalic.  Eyes: Pupils are equal, round, and reactive to light.  Neck: Normal range of motion.  Cardiovascular: Normal rate.  Respiratory: Effort  normal.  GI: Soft.  Stable morbid truncal obesity.   Genitourinary:  Genitourinary Comments: Buried penis. approx 1.5cm area of redundant frenular tissue that is stable. Otherwise good cosmesis.   Musculoskeletal: Normal range of motion.  Neurological: He is alert.  Skin: Skin is warm.  Psychiatric: He has a normal mood and affect.     Assessment/Plan  1 - Phimosis with Buried Penis - proceed with revision circumcision. Goal today is to reduce redundant frenular tissue, rest of penile cosmesis / funciton is very good. Risks,  Benefits,  alternatives (including observation only) discussed previously and reiterated today.   Alexis Frock, MD 10/04/2017, 7:23 AM

## 2017-10-04 NOTE — Anesthesia Procedure Notes (Signed)
Procedure Name: LMA Insertion Date/Time: 10/04/2017 3:04 PM Performed by: West Pugh, CRNA Pre-anesthesia Checklist: Patient identified, Emergency Drugs available, Suction available, Patient being monitored and Timeout performed Patient Re-evaluated:Patient Re-evaluated prior to induction Oxygen Delivery Method: Circle system utilized Preoxygenation: Pre-oxygenation with 100% oxygen Induction Type: IV induction Ventilation: Mask ventilation without difficulty LMA: LMA with gastric port inserted LMA Size: 5.0 Number of attempts: 1 Placement Confirmation: positive ETCO2 and breath sounds checked- equal and bilateral Tube secured with: Tape Dental Injury: Teeth and Oropharynx as per pre-operative assessment

## 2017-10-07 ENCOUNTER — Encounter (HOSPITAL_COMMUNITY): Payer: Self-pay | Admitting: Urology

## 2017-10-07 NOTE — Op Note (Signed)
NAME:  Reginald Campbell, Reginald Campbell NO.:  [id number]  MEDICAL RECORD NO.:  [id number]  LOCATION:                                 FACILITY:  PHYSICIAN:  Alexis Frock, MD     DATE OF BIRTH:  11-20-1975  DATE OF PROCEDURE: 10/04/2017                               OPERATIVE REPORT   DIAGNOSIS:  Redundant foreskin after prior circumcision.  PROCEDUES: 1. Revision circumcision. 2. Penile block.  ESTIMATED BLOOD LOSS:  Nil.  COMPLICATION:  None.  SPECIMEN:  Area of redundant frenular tissue for discard.  FINDINGS:  Approximately 1.5 cm2 area of redundant frenular tissue resulting in poor cosmesis and function, this was completely excised.  INDICATION:  [name] is a pleasant 42 year old gentleman with history of morbid obesity.  He had a recent circumcision as he had severe phimosis, this resulted in a fair functional outcome; however, he has been bothered by some redundant frenular tissue that he finds interferes with function and cosmesis.  He adamantly wished to undergo revision of this with excision of this tissue.  He was observed serially and this tissue was stabilized and felt that he is now a suitable candidate for this.  Informed consent was obtained and placed in the medical record.  PROCEDURE IN DETAIL:  The patient being [name], was verified. Procedure being revision circumcision was confirmed.  Procedure was carried out.  Time-out was performed.  Intravenous antibiotics were administered.  General LMA anesthesia was introduced.  The patient was placed into a supine position, and sterile field was created by prepping and draping the patient's penis and perineum using iodine.  The patient does have a significantly buried penis.  Attention was directed to the penile block.  A 10 cc of 0.25% plain Marcaine was injected in a ring block type fashion to the base of the penis.  Next, the area of redundant frenular tissue was identified, it was approximately 5  cm2.  A curved hemostat was applied to this to create a hemostatic plane, which was then excised, set aside for discard.  The edges of this were reapproximated with running Vicryl. Hemostasis appeared excellent.  Procedure was then terminated.  The patient tolerated the procedure well.  There were no immediate periprocedural complications.  The patient was taken to the postanesthesia care unit in stable condition.          ______________________________ Alexis Frock, MD     TM/MEDQ  D:  10/04/2017  T:  10/05/2017  Job:  [id number]

## 2017-11-18 ENCOUNTER — Emergency Department (HOSPITAL_COMMUNITY)
Admission: EM | Admit: 2017-11-18 | Discharge: 2017-11-19 | Disposition: A | Discharge status: Home / Self Care | Payer: Medicaid Other | Attending: Emergency Medicine | Admitting: Emergency Medicine

## 2017-11-18 ENCOUNTER — Encounter (HOSPITAL_COMMUNITY): Payer: Self-pay

## 2017-11-18 ENCOUNTER — Emergency Department (HOSPITAL_COMMUNITY): Payer: Medicaid Other

## 2017-11-18 DIAGNOSIS — R109 Unspecified abdominal pain: Secondary | ICD-10-CM | POA: Diagnosis present

## 2017-11-18 DIAGNOSIS — R55 Syncope and collapse: Secondary | ICD-10-CM | POA: Diagnosis not present

## 2017-11-18 DIAGNOSIS — I10 Essential (primary) hypertension: Secondary | ICD-10-CM | POA: Diagnosis not present

## 2017-11-18 DIAGNOSIS — Z87891 Personal history of nicotine dependence: Secondary | ICD-10-CM | POA: Diagnosis not present

## 2017-11-18 DIAGNOSIS — N200 Calculus of kidney: Secondary | ICD-10-CM | POA: Diagnosis not present

## 2017-11-18 DIAGNOSIS — J45909 Unspecified asthma, uncomplicated: Secondary | ICD-10-CM | POA: Diagnosis not present

## 2017-11-18 LAB — URINALYSIS, ROUTINE W REFLEX MICROSCOPIC
BILIRUBIN URINE: NEGATIVE
GLUCOSE, UA: NEGATIVE mg/dL
HGB URINE DIPSTICK: NEGATIVE
Ketones, ur: NEGATIVE mg/dL
LEUKOCYTES UA: NEGATIVE
NITRITE: NEGATIVE
PH: 5 (ref 5.0–8.0)
Protein, ur: 30 mg/dL — AB
Specific Gravity, Urine: 1.024 (ref 1.005–1.030)

## 2017-11-18 LAB — CBC
HCT: 46.4 % (ref 39.0–52.0)
Hemoglobin: 15.6 g/dL (ref 13.0–17.0)
MCH: 29.8 pg (ref 26.0–34.0)
MCHC: 33.6 g/dL (ref 30.0–36.0)
MCV: 88.7 fL (ref 78.0–100.0)
Platelets: 198 10*3/uL (ref 150–400)
RBC: 5.23 MIL/uL (ref 4.22–5.81)
RDW: 14.1 % (ref 11.5–15.5)
WBC: 10.5 10*3/uL (ref 4.0–10.5)

## 2017-11-18 LAB — BASIC METABOLIC PANEL
ANION GAP: 12 (ref 5–15)
BUN: 12 mg/dL (ref 6–20)
CALCIUM: 8.7 mg/dL — AB (ref 8.9–10.3)
CO2: 19 mmol/L — ABNORMAL LOW (ref 22–32)
CREATININE: 0.93 mg/dL (ref 0.61–1.24)
Chloride: 108 mmol/L (ref 101–111)
Glucose, Bld: 148 mg/dL — ABNORMAL HIGH (ref 65–99)
Potassium: 3.6 mmol/L (ref 3.5–5.1)
SODIUM: 139 mmol/L (ref 135–145)

## 2017-11-18 MED ORDER — KETOROLAC TROMETHAMINE 30 MG/ML IJ SOLN: 30.0000 mg | Freq: Once | INTRAMUSCULAR | Status: DC

## 2017-11-18 MED ORDER — FENTANYL CITRATE (PF) 100 MCG/2ML IJ SOLN
25.0000 ug | Freq: Once | INTRAMUSCULAR | Status: AC
  Administered 2017-11-18: 25 ug via INTRAVENOUS
  Filled 2017-11-18: qty 2

## 2017-11-18 MED ORDER — HYDROMORPHONE HCL 2 MG/ML IJ SOLN
1.0000 mg | Freq: Once | INTRAMUSCULAR | Status: AC
  Administered 2017-11-18: 1 mg via INTRAVENOUS
  Filled 2017-11-18: qty 1

## 2017-11-18 MED ORDER — ONDANSETRON HCL 4 MG/2ML IJ SOLN
4.0000 mg | Freq: Once | INTRAMUSCULAR | Status: AC
  Administered 2017-11-18: 4 mg via INTRAVENOUS
  Filled 2017-11-18: qty 2

## 2017-11-18 MED ORDER — KETOROLAC TROMETHAMINE 30 MG/ML IJ SOLN
30.0000 mg | Freq: Once | INTRAMUSCULAR | Status: AC
  Administered 2017-11-18: 30 mg via INTRAVENOUS
  Filled 2017-11-18: qty 1

## 2017-11-18 NOTE — ED Provider Notes (Signed)
MSE was initiated and I personally evaluated the patient and placed orders (if any) at  4:24 PM on November 18, 2017.  The patient appears stable so that the remainder of the MSE may be completed by another provide    Patient placed in Quick Look pathway, seen and evaluated   Chief Complaint: R flank pain  HPI:   Patient with history of recurrent kidney stone presents the emergency department with chief complaint of right flank pain, onset this morning when he was urinating.  States that it feels exactly the same as previous kidney stones.  He is associated diaphoresis, nausea, vomiting and right flank pain.  He denies fevers or chills.  He denies chest pain or shortness of breath  ROS: Flank pain (one)  Physical Exam:   Gen: No distress  Neuro: Awake and Alert  Skin: Cool and diaphoretic    Focused Exam: Right CVA tenderness   Initiation of care has begun. The patient has been counseled on the process, plan, and necessity for staying for the completion/evaluation, and the remainder of the medical screening examination    Margarita Mail, PA-C 11/18/17 [place], [place], DO 11/18/17 2100

## 2017-11-18 NOTE — ED Triage Notes (Signed)
Pt presents to ED with complaints of right flank pain since waking up this morning. PT has hx of kidney stones. Endorses n/v difficulty starting urine stream. Denies dysuria. Pt had syncopal episode while waiting in lobby.

## 2017-11-19 MED ORDER — ONDANSETRON 4 MG PO TBDP: 4.0000 mg | ORAL_TABLET | Freq: Three times a day (TID) | ORAL | 0 refills | Status: DC | PRN

## 2017-11-19 MED ORDER — OXYCODONE-ACETAMINOPHEN 5-325 MG PO TABS: 1.0000 | ORAL_TABLET | ORAL | 0 refills | Status: DC | PRN

## 2017-11-19 NOTE — ED Notes (Signed)
ED Provider at bedside. 

## 2017-11-19 NOTE — Discharge Instructions (Signed)
Take the prescribed medication as directed. Current stone is small-- should probably pass within the next 24-48 hours. Follow-up with urology group if any ongoing issues. Return to the ED for new or worsening symptoms.

## 2017-11-19 NOTE — ED Provider Notes (Signed)
[facility] EMERGENCY DEPARTMENT Provider Note   CSN: [id number] Arrival date & time: 11/18/17  1522     History   Chief Complaint Chief Complaint  Patient presents with  . Loss of Consciousness  . Flank Pain    HPI Reginald Campbell is a 42 y.o. male.  The history is provided by the patient and medical records.  Loss of Consciousness    Flank Pain     42 year old male with history of arthritis, asthma, chronic back pain, depression, history of kidney stones, hypertension, sleep apnea, presenting to the ED with right-sided flank pain.  Patient was here in the hospital with his father while he was getting evaluated and went to use the restroom.  After urinating he had immediate onset of [place], stabbing, right-sided flank pain.  He did not noticed any hematuria.  He was evaluated by provider in triage and had labs and CT scan performed.  He was given medications and reports he is feeling significantly better.  He has had multiple kidney stones in the past, states this feels similar.  He has been followed by [facility].  Patient states just prior to being placed into room, he went and urinated and felt like his kidney stone may have "dropped".  Past Medical History:  Diagnosis Date  . Arthritis   . Asthma   . Back pain   . BRBPR (bright red blood per rectum) 04/07/2015  . Depression   . Family history of adverse reaction to anesthesia    daughter under anesthesia for 6 hours had problems with N/V   . History of kidney stones   . Hypertension    hx of elevated bp, no meds for htn  . Phimosis   . Sleep apnea    does not use cpap due to broke    Patient Active Problem List   Diagnosis Date Noted  . Internal hemorrhoids 03/08/2017  . Rectal bleeding 03/07/2017  . 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 Morbid obesity ([place]) 10/02/2016  . Lumbar radiculopathy, chronic 10/01/2016  . MDD (major depressive disorder) 10/01/2016  . Bilateral shoulder pain  08/18/2015  . Nephrolithiasis 04/18/2015  . Healthcare maintenance 04/07/2015  . Family history of diabetes mellitus 04/07/2015  . Obstructive sleep apnea 04/07/2015    Past Surgical History:  Procedure Laterality Date  . CIRCUMCISION N/A 03/27/2017   Procedure: CIRCUMCISION ADULT with canal block;  Surgeon: Alexis Frock, MD;  Location: [facility];  Service: Urology;  Laterality: N/A;  . CIRCUMCISION REVISION N/A 10/04/2017   Procedure: CIRCUMCISION REVISION;  Surgeon: Alexis Frock, MD;  Location: [facility];  Service: Urology;  Laterality: N/A;  . NO PAST SURGERIES          Home Medications    Prior to Admission medications   Medication Sig Start Date End Date Taking? Authorizing Provider  hydrocortisone (ANUSOL-HC) 25 MG suppository Use 1 suppository twice daily for 7 days. Patient not taking: Reported on 05/16/2017 03/08/17   Zehr, Laban Emperor, PA-C  naproxen (NAPROSYN) 500 MG tablet Take 1 tablet (500 mg total) by mouth 2 (two) times daily with a meal. Patient not taking: Reported on 05/16/2017 03/17/17   Sherwood Gambler, MD  senna-docusate (SENOKOT-S) 8.6-50 MG tablet Take 1 tablet by mouth 2 (two) times daily. While taking strong pain meds to prevent constipation. Patient not taking: Reported on 11/18/2017 10/04/17   Alexis Frock, MD  traMADol (ULTRAM) 50 MG tablet Take 1-2 tablets (50-100 mg total) by mouth every 6 (six)  hours as needed for moderate pain or severe pain. Post-operatively. Patient not taking: Reported on 11/18/2017 10/04/17   Alexis Frock, MD    Family History Family History  Problem Relation Age of Onset  . Heart disease Mother   . Diabetes Father   . Hypertension Father   . Cirrhosis Father   . Heart disease Maternal Grandfather   . Melanoma Paternal Grandmother   . Lung cancer Paternal Uncle   . Lung cancer Paternal Aunt   . Asthma Daughter   . Gallbladder disease Daughter   . Erythema nodosum Daughter     Social History Social History   Tobacco Use   . Smoking status: Former Smoker    Packs/day: 1.00    Years: 1.00    Pack years: 1.00    Last attempt to quit: 07/30/1994    Years since quitting: 23.3  . Smokeless tobacco: Never Used  Substance Use Topics  . Alcohol use: No    Alcohol/week: 0.0 oz  . Drug use: No     Allergies   Penicillins   Review of Systems Review of Systems  Cardiovascular: Positive for syncope.  Genitourinary: Positive for flank pain.  All other systems reviewed and are negative.    Physical Exam Updated Vital Signs BP (!) 159/91   Pulse 72   Temp 98.3 F (36.8 C)   Resp 17   SpO2 98%   Physical Exam  Constitutional: He is oriented to person, place, and time. He appears well-developed and well-nourished.  HENT:  Head: Normocephalic and atraumatic.  Mouth/Throat: Oropharynx is clear and moist.  Eyes: Pupils are equal, round, and reactive to light. Conjunctivae and EOM are normal.  Neck: Normal range of motion.  Cardiovascular: Normal rate, regular rhythm and normal heart sounds.  Pulmonary/Chest: Effort normal and breath sounds normal. No stridor. No respiratory distress.  Abdominal: Soft. Bowel sounds are normal. There is no tenderness. There is no rebound and no CVA tenderness.  Musculoskeletal: Normal range of motion.  Neurological: He is alert and oriented to person, place, and time.  Skin: Skin is warm and dry.  Psychiatric: He has a normal mood and affect.  Nursing note and vitals reviewed.    ED Treatments / Results  Labs (all labs ordered are listed, but only abnormal results are displayed) Labs Reviewed  BASIC METABOLIC PANEL - Abnormal; Notable for the following components:      Result Value   CO2 19 (*)    Glucose, Bld 148 (*)    Calcium 8.7 (*)    All other components within normal limits  URINALYSIS, ROUTINE W REFLEX MICROSCOPIC - Abnormal; Notable for the following components:   APPearance HAZY (*)    Protein, ur 30 (*)    Bacteria, UA RARE (*)    Squamous  Epithelial / LPF 6-30 (*)    All other components within normal limits  CBC    EKG None  Radiology Ct Renal Stone Study  Result Date: 11/18/2017 CLINICAL DATA:  Right flank pain since 3 p.m. EXAM: CT ABDOMEN AND PELVIS WITHOUT CONTRAST TECHNIQUE: Multidetector CT imaging of the abdomen and pelvis was performed following the standard protocol without IV contrast. COMPARISON:  03/17/2017 CT FINDINGS: Lower chest: Heart size is within normal limits. No pericardial effusion. No active pulmonary disease. Hepatobiliary: Mild hepatic steatosis. No biliary dilatation. No space-occupying mass given limitations of a noncontrast study. No gallstones or evidence of cholecystitis. Pancreas: Mild fatty atrophy of the pancreas without inflammation, mass or ductal dilatation.  Spleen: Normal Adrenals/Urinary Tract: Adrenal mass. Perinephric fat stranding is noted on the right associated with a punctate calculus at the right UVJ, series 3/96. A 6 mm right interpolar renal calculus is stable in size and position. Punctate nonobstructing left-sided renal calculus in the interpolar aspect. No left-sided hydroureteronephrosis. The urinary bladder is decompressed. Stomach/Bowel: Stomach is within normal limits. Appendix appears normal. No evidence of bowel wall thickening, distention, or inflammatory changes. Diffuse colonic spasm from mid transverse colon through rectosigmoid. Normal appendix. Vascular/Lymphatic: No significant vascular findings are present. No enlarged abdominal or pelvic lymph nodes. Reproductive: Prostate and seminal vesicles are unremarkable. Other: No free air nor free fluid. Musculoskeletal: No acute or significant osseous findings. IMPRESSION: 1. Punctate right UVJ stone causing mild right-sided hydroureteronephrosis and perinephric fat stranding. 2. Stable 6 mm calculus in the interpolar right kidney. Stable punctate left-sided nephrolithiasis. Electronically Signed   By: Ashley Royalty M.D.   On:  11/18/2017 21:17    Procedures Procedures (including critical care time)  Medications Ordered in ED Medications  HYDROmorphone (DILAUDID) injection 1 mg (1 mg Intravenous Given 11/18/17 1651)  ondansetron (ZOFRAN) injection 4 mg (4 mg Intravenous Given 11/18/17 1651)  fentaNYL (SUBLIMAZE) injection 25 mcg (25 mcg Intravenous Given 11/18/17 1930)  ketorolac (TORADOL) 30 MG/ML injection 30 mg (30 mg Intravenous Given 11/18/17 2140)     Initial Impression / Assessment and Plan / ED Course  I have reviewed the triage vital signs and the nursing notes.  Pertinent labs & imaging results that were available during my care of the patient were reviewed by me and considered in my medical decision making (see chart for details).  42 year old male presenting to the ED with right-sided flank pain.  Suspect a kidney stone, has history of same.  He is afebrile and nontoxic on my evaluation.  Workup from triage is overall reassuring.  UA without any signs of infection.  CT does reveal punctate right UVJ stone.  Patient has had significant relief here, suspect this may have dropped into the bladder by now.  Also has 6 mm stone in the right kidney as well as some stones on the left which he was made aware of.  FU with urology..  Given prescription for pain and nausea medication for symptomatic control.  Discussed plan with patient, he acknowledged understanding and agreed with plan of care.  Return precautions given for new or worsening symptoms.  Final Clinical Impressions(s) / ED Diagnoses   Final diagnoses:  Kidney stone    ED Discharge Orders        Ordered    oxyCODONE-acetaminophen (PERCOCET) 5-325 MG tablet  Every 4 hours PRN     11/19/17 0157    ondansetron (ZOFRAN ODT) 4 MG disintegrating tablet  Every 8 hours PRN     11/19/17 0157       Larene Pickett, PA-C 11/19/17 Loura Pardon    Jola Schmidt, MD 11/19/17 [phone]

## 2018-02-12 ENCOUNTER — Emergency Department (HOSPITAL_COMMUNITY)
Admission: EM | Admit: 2018-02-12 | Discharge: 2018-02-13 | Disposition: A | Discharge status: Home / Self Care | Payer: Medicaid Other | Attending: Emergency Medicine | Admitting: Emergency Medicine

## 2018-02-12 ENCOUNTER — Encounter (HOSPITAL_COMMUNITY): Payer: Self-pay

## 2018-02-12 ENCOUNTER — Other Ambulatory Visit: Payer: Self-pay

## 2018-02-12 DIAGNOSIS — Z87891 Personal history of nicotine dependence: Secondary | ICD-10-CM | POA: Insufficient documentation

## 2018-02-12 DIAGNOSIS — X501XXA Overexertion from prolonged static or awkward postures, initial encounter: Secondary | ICD-10-CM | POA: Insufficient documentation

## 2018-02-12 DIAGNOSIS — S39012A Strain of muscle, fascia and tendon of lower back, initial encounter: Secondary | ICD-10-CM | POA: Diagnosis not present

## 2018-02-12 DIAGNOSIS — J45909 Unspecified asthma, uncomplicated: Secondary | ICD-10-CM | POA: Diagnosis not present

## 2018-02-12 DIAGNOSIS — Z79899 Other long term (current) drug therapy: Secondary | ICD-10-CM | POA: Diagnosis not present

## 2018-02-12 DIAGNOSIS — Y939 Activity, unspecified: Secondary | ICD-10-CM | POA: Insufficient documentation

## 2018-02-12 DIAGNOSIS — Y929 Unspecified place or not applicable: Secondary | ICD-10-CM | POA: Insufficient documentation

## 2018-02-12 DIAGNOSIS — T148XXA Other injury of unspecified body region, initial encounter: Secondary | ICD-10-CM

## 2018-02-12 DIAGNOSIS — Y999 Unspecified external cause status: Secondary | ICD-10-CM | POA: Insufficient documentation

## 2018-02-12 DIAGNOSIS — S3992XA Unspecified injury of lower back, initial encounter: Secondary | ICD-10-CM | POA: Diagnosis present

## 2018-02-12 DIAGNOSIS — I10 Essential (primary) hypertension: Secondary | ICD-10-CM | POA: Insufficient documentation

## 2018-02-12 NOTE — ED Provider Notes (Signed)
[facility] EMERGENCY DEPARTMENT Provider Note   CSN: [id number] Arrival date & time: 02/12/18  1954     History   Chief Complaint Chief Complaint  Patient presents with  . Back Pain    HPI Reginald Campbell is a 42 y.o. male with a past medical history of kidney stones, hypertension, who presents to ED for evaluation of 5-day history of left lower back pain worse with movement.  States that prior to symptoms onset, he was helping his dad clean some fish.  He was bent forward for several hours which is unusual for him to do.  He has tried leftover tramadol and icy hot patches in the area with no improvement in his symptoms.  States that it feels like it is an issue with his muscle in the area.  Putting pressure on the area helps to improve the pain.  He states that when he has kidney stones, his pain usually radiates to the front of his abdomen and to his groin.  This is not present at this time.  However, he does endorse some dysuria.  Denies any injuries or falls.  Denies any prior back surgeries, history of cancer, history of IV drug use, chest pain, shortness of breath, numbness in legs, fever.  HPI  Past Medical History:  Diagnosis Date  . Arthritis   . Asthma   . Back pain   . BRBPR (bright red blood per rectum) 04/07/2015  . Depression   . Family history of adverse reaction to anesthesia    daughter under anesthesia for 6 hours had problems with N/V   . History of kidney stones   . Hypertension    hx of elevated bp, no meds for htn  . Phimosis   . Sleep apnea    does not use cpap due to broke    Patient Active Problem List   Diagnosis Date Noted  . Internal hemorrhoids 03/08/2017  . Rectal bleeding 03/07/2017  . 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 Morbid obesity ([place]) 10/02/2016  . Lumbar radiculopathy, chronic 10/01/2016  . MDD (major depressive disorder) 10/01/2016  . Bilateral shoulder pain 08/18/2015  . Nephrolithiasis 04/18/2015  .  Healthcare maintenance 04/07/2015  . Family history of diabetes mellitus 04/07/2015  . Obstructive sleep apnea 04/07/2015    Past Surgical History:  Procedure Laterality Date  . CIRCUMCISION N/A 03/27/2017   Procedure: CIRCUMCISION ADULT with canal block;  Surgeon: Alexis Frock, MD;  Location: [facility];  Service: Urology;  Laterality: N/A;  . CIRCUMCISION REVISION N/A 10/04/2017   Procedure: CIRCUMCISION REVISION;  Surgeon: Alexis Frock, MD;  Location: [facility];  Service: Urology;  Laterality: N/A;  . NO PAST SURGERIES          Home Medications    Prior to Admission medications   Medication Sig Start Date End Date Taking? Authorizing Provider  hydrocortisone (ANUSOL-HC) 25 MG suppository Use 1 suppository twice daily for 7 days. Patient not taking: Reported on 05/16/2017 03/08/17   Zehr, Laban Emperor, PA-C  methocarbamol (ROBAXIN) 500 MG tablet Take 1 tablet (500 mg total) by mouth 2 (two) times daily. 02/13/18   Jamaurion Slemmer, PA-C  naproxen (NAPROSYN) 500 MG tablet Take 1 tablet (500 mg total) by mouth 2 (two) times daily with a meal. Patient not taking: Reported on 05/16/2017 03/17/17   Sherwood Gambler, MD  ondansetron (ZOFRAN ODT) 4 MG disintegrating tablet Take 1 tablet (4 mg total) by mouth every 8 (eight) hours as needed for nausea. 11/19/17  Larene Pickett, PA-C  oxyCODONE-acetaminophen (PERCOCET/ROXICET) 5-325 MG tablet Take 1 tablet by mouth every 8 (eight) hours as needed for severe pain. 02/13/18   Suhaylah Wampole, PA-C  senna-docusate (SENOKOT-S) 8.6-50 MG tablet Take 1 tablet by mouth 2 (two) times daily. While taking strong pain meds to prevent constipation. Patient not taking: Reported on 11/18/2017 10/04/17   Alexis Frock, MD  traMADol (ULTRAM) 50 MG tablet Take 1-2 tablets (50-100 mg total) by mouth every 6 (six) hours as needed for moderate pain or severe pain. Post-operatively. Patient not taking: Reported on 11/18/2017 10/04/17   Alexis Frock, MD    Family History Family  History  Problem Relation Age of Onset  . Heart disease Mother   . Diabetes Father   . Hypertension Father   . Cirrhosis Father   . Heart disease Maternal Grandfather   . Melanoma Paternal Grandmother   . Lung cancer Paternal Uncle   . Lung cancer Paternal Aunt   . Asthma Daughter   . Gallbladder disease Daughter   . Erythema nodosum Daughter     Social History Social History   Tobacco Use  . Smoking status: Former Smoker    Packs/day: 1.00    Years: 1.00    Pack years: 1.00    Last attempt to quit: 07/30/1994    Years since quitting: 23.5  . Smokeless tobacco: Never Used  Substance Use Topics  . Alcohol use: No    Alcohol/week: 0.0 oz  . Drug use: No     Allergies   Penicillins   Review of Systems Review of Systems  Constitutional: Negative for appetite change, chills and fever.  HENT: Negative for ear pain, rhinorrhea, sneezing and sore throat.   Eyes: Negative for photophobia and visual disturbance.  Respiratory: Negative for cough, chest tightness, shortness of breath and wheezing.   Cardiovascular: Negative for chest pain and palpitations.  Gastrointestinal: Negative for abdominal pain, blood in stool, constipation, diarrhea, nausea and vomiting.  Genitourinary: Positive for dysuria. Negative for flank pain, hematuria and urgency.  Musculoskeletal: Positive for back pain. Negative for myalgias.  Skin: Negative for rash.  Neurological: Negative for dizziness, weakness and light-headedness.     Physical Exam Updated Vital Signs BP 112/74 (BP Location: Right Arm)   Pulse 74   Temp 97.7 F (36.5 C) (Oral)   Resp 18   SpO2 98%   Physical Exam  Constitutional: He appears well-developed and well-nourished. No distress.  HENT:  Head: Normocephalic and atraumatic.  Nose: Nose normal.  Eyes: Conjunctivae and EOM are normal. Right eye exhibits no discharge. Left eye exhibits no discharge. No scleral icterus.  Neck: Normal range of motion. Neck supple.    Cardiovascular: Normal rate, regular rhythm, normal heart sounds and intact distal pulses. Exam reveals no gallop and no friction rub.  No murmur heard. Pulmonary/Chest: Effort normal and breath sounds normal. No respiratory distress.  Abdominal: Soft. Bowel sounds are normal. He exhibits no distension. There is no tenderness. There is no guarding.  Left CVA tenderness.  Musculoskeletal: Normal range of motion. He exhibits no edema.  No midline spinal tenderness present in lumbar, thoracic or cervical spine. No step-off palpated. No visible bruising, edema or temperature change noted. No objective signs of numbness present. No saddle anesthesia. 2+ DP pulses bilaterally. Sensation intact to light touch. Strength 5/5 in bilateral lower extremities.  Neurological: He is alert. He exhibits normal muscle tone. Coordination normal.  Skin: Skin is warm and dry. No rash noted.  Psychiatric: He  has a normal mood and affect.  Nursing note and vitals reviewed.    ED Treatments / Results  Labs (all labs ordered are listed, but only abnormal results are displayed) Labs Reviewed  URINALYSIS, ROUTINE W REFLEX MICROSCOPIC - Abnormal; Notable for the following components:      Result Value   Leukocytes, UA TRACE (*)    All other components within normal limits    EKG None  Radiology No results found.  Procedures Procedures (including critical care time)  Medications Ordered in ED Medications - No data to display   Initial Impression / Assessment and Plan / ED Course  I have reviewed the triage vital signs and the nursing notes.  Pertinent labs & imaging results that were available during my care of the patient were reviewed by me and considered in my medical decision making (see chart for details).     Patient denies any concerning symptoms suggestive of cauda equina requiring urgent imaging at this time such as loss of sensation in the lower extremities, lower extremity weakness, loss  of bowel or bladder control, saddle anesthesia, urinary retention, fever/chills, IVDU. Exam demonstrated no  weakness on exam today. No preceding injury or trauma to suggest acute fracture. Doubt pelvic or urinary pathology for patient's acute back pain, as patient denies urinary symptoms, has no evidence of infection on UA, has no CVA tenderness, history/pain not consistent with nephrolithiasis. Doubt AAA as cause of patient's back pain as patient lacks chest pain, had no abdominal TTP, and has symmetric and intact distal pulses. Patient given strict return precautions for any symptoms indicating worsening neurologic function in the lower extremities.  Will give short course of pain medication, muscle relaxer for what appears to be musculoskeletal pain. [place] PMP reviewed with no discrepancies.  Portions of this note were generated with [employer]. Dictation errors may occur despite best attempts at proofreading.  Final Clinical Impressions(s) / ED Diagnoses   Final diagnoses:  Muscle strain    ED Discharge Orders        Ordered    oxyCODONE-acetaminophen (PERCOCET/ROXICET) 5-325 MG tablet  Every 8 hours PRN     02/13/18 0043    methocarbamol (ROBAXIN) 500 MG tablet  2 times daily     02/13/18 0043       Delia Heady, PA-C 02/13/18 0044    Fredia Sorrow, MD 02/13/18 0104

## 2018-02-12 NOTE — ED Triage Notes (Signed)
Pt reports that three days ago his thoracic region of his back began to hurt, pt denies injury. Worse with standing

## 2018-02-13 LAB — URINALYSIS, ROUTINE W REFLEX MICROSCOPIC
BILIRUBIN URINE: NEGATIVE
Bacteria, UA: NONE SEEN
GLUCOSE, UA: NEGATIVE mg/dL
Hgb urine dipstick: NEGATIVE
KETONES UR: NEGATIVE mg/dL
NITRITE: NEGATIVE
PH: 6 (ref 5.0–8.0)
Protein, ur: NEGATIVE mg/dL
Specific Gravity, Urine: 1.025 (ref 1.005–1.030)

## 2018-02-13 MED ORDER — METHOCARBAMOL 500 MG PO TABS: 500.0000 mg | ORAL_TABLET | Freq: Two times a day (BID) | ORAL | 0 refills | Status: DC

## 2018-02-13 MED ORDER — OXYCODONE-ACETAMINOPHEN 5-325 MG PO TABS: 1.0000 | ORAL_TABLET | Freq: Three times a day (TID) | ORAL | 0 refills | Status: DC | PRN

## 2018-02-13 NOTE — Discharge Instructions (Signed)
Use Salonpas patches in the area to help with discomfort. These are available over the counter. Return to the emergency room if you start to develop worsening of your pain, trouble walking, loss of your bladder function, numbness in your legs, chest pain or shortness of breath.

## 2018-04-09 ENCOUNTER — Encounter (INDEPENDENT_AMBULATORY_CARE_PROVIDER_SITE_OTHER): Payer: Self-pay

## 2018-04-09 ENCOUNTER — Encounter: Payer: Self-pay | Admitting: Internal Medicine

## 2018-04-09 ENCOUNTER — Ambulatory Visit: Payer: Medicaid Other | Admitting: Internal Medicine

## 2018-04-09 ENCOUNTER — Other Ambulatory Visit: Payer: Self-pay

## 2018-04-09 VITALS — BP 118/70 | HR 75 | Temp 97.9°F | Ht 70.0 in | Wt 371.6 lb

## 2018-04-09 DIAGNOSIS — M5442 Lumbago with sciatica, left side: Secondary | ICD-10-CM

## 2018-04-09 DIAGNOSIS — G8929 Other chronic pain: Secondary | ICD-10-CM | POA: Diagnosis not present

## 2018-04-09 DIAGNOSIS — M5441 Lumbago with sciatica, right side: Secondary | ICD-10-CM

## 2018-04-09 DIAGNOSIS — Z791 Long term (current) use of non-steroidal anti-inflammatories (NSAID): Secondary | ICD-10-CM

## 2018-04-09 DIAGNOSIS — M545 Low back pain, unspecified: Secondary | ICD-10-CM

## 2018-04-09 DIAGNOSIS — Z0000 Encounter for general adult medical examination without abnormal findings: Secondary | ICD-10-CM

## 2018-04-09 DIAGNOSIS — I10 Essential (primary) hypertension: Secondary | ICD-10-CM | POA: Diagnosis not present

## 2018-04-09 DIAGNOSIS — G4733 Obstructive sleep apnea (adult) (pediatric): Secondary | ICD-10-CM

## 2018-04-09 MED ORDER — OXYCODONE-ACETAMINOPHEN 5-325 MG PO TABS: 1.0000 | ORAL_TABLET | Freq: Four times a day (QID) | ORAL | 0 refills | Status: DC | PRN

## 2018-04-09 MED ORDER — IBUPROFEN 200 MG PO TABS: 800.0000 mg | ORAL_TABLET | Freq: Three times a day (TID) | ORAL | 0 refills | Status: DC

## 2018-04-09 NOTE — Assessment & Plan Note (Signed)
  Acute on chronic mid spinal Back Pain: Patient endorses severe back pain with focal point tenderness. He stated that the pain was worse with recent activity but is 8/10 with almost any movement. There is a 4/10 baseline pain that is not well controlled with NSAIDS. He attempted physical therapy but states that his pain was so severely worsened by a single session that he could not drive home. He endorses pain radiating down his leg, worse with sitting, standing or walking. The pain greatly impairs his daily activities as he is primarily restricted to rest to avoid the severe symptoms. He denies loss of bowel or bladder control. CT renal studies did not demonstrate notable vertebral abnormalities.   Plan: We will prescribe a short one time three day course of Percocet to assist with the acute worsening. I advised Motrin 800mg  TID for five day Patient was advised to call [facility], number provided on AVS. MRI thoracic and lumbar spine ordered.

## 2018-04-09 NOTE — Patient Instructions (Signed)
FOLLOW-UP INSTRUCTIONS When: Three months For: Routine visit and discussion of your back pain What to bring: All of your medications  Thank you for your visit to the [facility].  I have prescribed you a short course of percocet to help tie you over for acute exacerbation of your back pain which you should only take as needed. Please take this in conjunction with the ibuprofen which should be taken three times per day for five days.  In addition, I have ordered an MRI, they will call to schedule this if it is authorized. Finally, and most importantly, please call [facility] to schedule an appointment. This will be the only way to continue effectively treating your chronic pain moving forward. They are well equipped to treat chronic pain.  [facility]  [address]  [place], [place] [address]

## 2018-04-09 NOTE — Assessment & Plan Note (Signed)
Health care Maintenance:  Patient advised to obtained influenza vaccine, opted to forgo, benefits of vaccination explained

## 2018-04-09 NOTE — Progress Notes (Signed)
   CC: acute worsening of his chronic back pain  HPI:[name] is a 42 y.o. male who presents today for evaluation and treatment of his acutely worsened chronic back pain.  OSA:  Patient unable to use his CPAP as he lives with his parents who also both utilize a CPAP device. As such, when all three are activated the power breaker flips due to the low voltage system and old nob and tube wiring. As such, he is currently without his device and not interested in this until he has his own home. He was advised that this was not ideal and would lead to worsening of his HTN, fatigue, increased risk of stroke and heart attacks. He currently denies severe fatigue, chest pain, dyspnea of myalgias.   Acute on chronic mid spinal Back Pain: Patient endorses severe back pain with focal point tenderness. He stated that the pain was worse with recent activity but is 8/10 with almost any movement. There is a 4/10 baseline pain that is not well controlled with NSAIDS. He attempted physical therapy but states that his pain was so severely worsened by a single session that he could not drive home. He endorses pain radiating down his leg, worse with sitting, standing or walking. The pain greatly impairs his daily activities as he is primarily restricted to rest to avoid the severe symptoms. He denies loss of bowel or bladder control. CT renal studies did not demonstrate notable vertebral abnormalities.   Plan: We will prescribe a short one time three day course of Percocet to assist with the acute worsening. I advised Motrin 800mg  TID for five day Patient was advised to call [facility], number provided on AVS. MRI thoracic and lumbar spine ordered.   Health care Maintenance:  Patient advised to obtained influenza vaccine, opted to forgo, benefits of vaccination explained   Past Medical History:  Diagnosis Date  . Arthritis   . Asthma   . Back pain   . BRBPR (bright red blood per rectum) 04/07/2015  .  Depression   . Family history of adverse reaction to anesthesia    daughter under anesthesia for 6 hours had problems with N/V   . History of kidney stones   . Hypertension    hx of elevated bp, no meds for htn  . Phimosis   . Sleep apnea    does not use cpap due to broke   Review of Systems:  ROS negative except as per HPI.  Physical Exam:  Vitals:   04/09/18 1325  BP: 118/70  Pulse: 75  Temp: 97.9 F (36.6 C)  TempSrc: Oral  SpO2: 98%  Weight: (!) 371 lb 9.6 oz (168.6 kg)  Height: 5\' 10"  (1.778 m)   General: A/O x4, in minimal distress 2/2 back pain, no acute respiratory or cardiac distress noted. Cardio: RRR, no mrg's Pulm: cta bilaterally GI: soft, nontender MSK: there is a focal point of tenderness just distal to approximately the T11-T12 vertebral body and overlying the vertebral body to a lesser degree. This is 1-2cm left and lateral. NO other focal signs noted, patient unwilling to perform additional evaluation due to pain.   Assessment & Plan:   See Encounters Tab for problem based charting.  Patient discussed with Dr. Rebeca Alert

## 2018-04-09 NOTE — Assessment & Plan Note (Signed)
OSA:  Patient unable to use his CPAP as he lives with his parents who also both utilize a CPAP device. As such, when all three are activated the power breaker flips due to the low voltage system and old nob and tube wiring. As such, he is currently without his device and not interested in this until he has his own home. He was advised that this was not ideal and would lead to worsening of his HTN, fatigue, increased risk of stroke and heart attacks. He currently denies severe fatigue, chest pain, dyspnea of myalgias.

## 2018-04-16 NOTE — Progress Notes (Signed)
Internal Medicine Clinic Attending  Case discussed with Dr. Harbrecht at the time of the visit.  We reviewed the resident's history and exam and pertinent patient test results.  I agree with the assessment, diagnosis, and plan of care documented in the resident's note.  Alexander Raines, M.D., Ph.D.  

## 2018-04-20 ENCOUNTER — Emergency Department (HOSPITAL_COMMUNITY)
Admission: EM | Admit: 2018-04-20 | Discharge: 2018-04-20 | Disposition: A | Discharge status: Home / Self Care | Payer: Medicaid Other | Attending: Emergency Medicine | Admitting: Emergency Medicine

## 2018-04-20 ENCOUNTER — Emergency Department (HOSPITAL_COMMUNITY): Payer: Medicaid Other

## 2018-04-20 ENCOUNTER — Encounter (HOSPITAL_COMMUNITY): Payer: Self-pay | Admitting: Nurse Practitioner

## 2018-04-20 DIAGNOSIS — Z87891 Personal history of nicotine dependence: Secondary | ICD-10-CM | POA: Insufficient documentation

## 2018-04-20 DIAGNOSIS — R300 Dysuria: Secondary | ICD-10-CM

## 2018-04-20 DIAGNOSIS — R310 Gross hematuria: Secondary | ICD-10-CM | POA: Diagnosis not present

## 2018-04-20 DIAGNOSIS — J45909 Unspecified asthma, uncomplicated: Secondary | ICD-10-CM | POA: Insufficient documentation

## 2018-04-20 DIAGNOSIS — I10 Essential (primary) hypertension: Secondary | ICD-10-CM | POA: Diagnosis not present

## 2018-04-20 DIAGNOSIS — R1111 Vomiting without nausea: Secondary | ICD-10-CM | POA: Diagnosis not present

## 2018-04-20 DIAGNOSIS — N201 Calculus of ureter: Secondary | ICD-10-CM | POA: Diagnosis not present

## 2018-04-20 LAB — CBC WITH DIFFERENTIAL/PLATELET
Basophils Absolute: 0 10*3/uL (ref 0.0–0.1)
Basophils Relative: 0 %
EOS ABS: 0 10*3/uL (ref 0.0–0.7)
Eosinophils Relative: 0 %
HCT: 45 % (ref 39.0–52.0)
HEMOGLOBIN: 15 g/dL (ref 13.0–17.0)
LYMPHS ABS: 0.5 10*3/uL — AB (ref 0.7–4.0)
Lymphocytes Relative: 7 %
MCH: 29.9 pg (ref 26.0–34.0)
MCHC: 33.3 g/dL (ref 30.0–36.0)
MCV: 89.8 fL (ref 78.0–100.0)
MONOS PCT: 6 %
Monocytes Absolute: 0.4 10*3/uL (ref 0.1–1.0)
NEUTROS PCT: 87 %
Neutro Abs: 6.2 10*3/uL (ref 1.7–7.7)
Platelets: 122 10*3/uL — ABNORMAL LOW (ref 150–400)
RBC: 5.01 MIL/uL (ref 4.22–5.81)
RDW: 14.2 % (ref 11.5–15.5)
WBC: 7.2 10*3/uL (ref 4.0–10.5)

## 2018-04-20 LAB — URINALYSIS, ROUTINE W REFLEX MICROSCOPIC
Bacteria, UA: NONE SEEN
Bilirubin Urine: NEGATIVE
Glucose, UA: NEGATIVE mg/dL
Ketones, ur: 5 mg/dL — AB
NITRITE: NEGATIVE
PH: 5 (ref 5.0–8.0)
Protein, ur: 30 mg/dL — AB
Specific Gravity, Urine: 1.029 (ref 1.005–1.030)

## 2018-04-20 LAB — COMPREHENSIVE METABOLIC PANEL
ALK PHOS: 94 U/L (ref 38–126)
ALT: 47 U/L — ABNORMAL HIGH (ref 0–44)
ANION GAP: 8 (ref 5–15)
AST: 46 U/L — ABNORMAL HIGH (ref 15–41)
Albumin: 3.9 g/dL (ref 3.5–5.0)
BILIRUBIN TOTAL: 1.3 mg/dL — AB (ref 0.3–1.2)
BUN: 15 mg/dL (ref 6–20)
CALCIUM: 8.9 mg/dL (ref 8.9–10.3)
CO2: 27 mmol/L (ref 22–32)
Chloride: 104 mmol/L (ref 98–111)
Creatinine, Ser: 1.05 mg/dL (ref 0.61–1.24)
Glucose, Bld: 142 mg/dL — ABNORMAL HIGH (ref 70–99)
Potassium: 3.9 mmol/L (ref 3.5–5.1)
SODIUM: 139 mmol/L (ref 135–145)
TOTAL PROTEIN: 7.8 g/dL (ref 6.5–8.1)

## 2018-04-20 LAB — LIPASE, BLOOD: LIPASE: 18 U/L (ref 11–51)

## 2018-04-20 MED ORDER — CIPROFLOXACIN HCL 500 MG PO TABS: 500.0000 mg | ORAL_TABLET | Freq: Two times a day (BID) | ORAL | 0 refills | Status: DC

## 2018-04-20 MED ORDER — TAMSULOSIN HCL 0.4 MG PO CAPS: 0.4000 mg | ORAL_CAPSULE | Freq: Every day | ORAL | 0 refills | Status: AC

## 2018-04-20 MED ORDER — ACETAMINOPHEN 500 MG PO TABS
1000.0000 mg | ORAL_TABLET | Freq: Once | ORAL | Status: AC
  Administered 2018-04-20: 1000 mg via ORAL
  Filled 2018-04-20: qty 2

## 2018-04-20 NOTE — ED Provider Notes (Signed)
[facility] DEPT Provider Note   CSN: [id number] Arrival date & time: 04/20/18  1444     History   Chief Complaint Chief Complaint  Patient presents with  . Dysuria    HPI Reginald [facility] is a 42 y.o. male with history of chronic bilateral low back pain on Percocet, kidney stones, obesity, hypertension, sleep apnea is here for evaluation of the dysuria onset this morning.  Associated with gross hematuria, nausea.  Reports resolved, epigastric abdominal pain described as a "gas bubble" yesterday however this completely resolved by the time his dysuria and hematuria began today.  He denies any fevers, chills, vomiting, current abdominal pain, flank pain, urinary frequency, penile discharge, skin changes or ulcerations to penis.  No alleviating or aggravating factors.  No interventions.  He denies any sexual activity in several years, he has no testicular pain or swelling, penile discharge.   HPI  Past Medical History:  Diagnosis Date  . Arthritis   . Asthma   . Back pain   . BRBPR (bright red blood per rectum) 04/07/2015  . Depression   . Family history of adverse reaction to anesthesia    daughter under anesthesia for 6 hours had problems with N/V   . History of kidney stones   . Hypertension    hx of elevated bp, no meds for htn  . Phimosis   . Sleep apnea    does not use cpap due to broke    Patient Active Problem List   Diagnosis Date Noted  . Chronic bilateral low back pain with bilateral sciatica 04/09/2018  . Internal hemorrhoids 03/08/2017  . Rectal bleeding 03/07/2017  . 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 Morbid obesity ([place]) 10/02/2016  . Lumbar radiculopathy, chronic 10/01/2016  . MDD (major depressive disorder) 10/01/2016  . Bilateral shoulder pain 08/18/2015  . Nephrolithiasis 04/18/2015  . Healthcare maintenance 04/07/2015  . Family history of diabetes mellitus 04/07/2015  . Obstructive sleep apnea 04/07/2015    Past  Surgical History:  Procedure Laterality Date  . CIRCUMCISION N/A 03/27/2017   Procedure: CIRCUMCISION ADULT with canal block;  Surgeon: Alexis Frock, MD;  Location: [facility];  Service: Urology;  Laterality: N/A;  . CIRCUMCISION REVISION N/A 10/04/2017   Procedure: CIRCUMCISION REVISION;  Surgeon: Alexis Frock, MD;  Location: [facility];  Service: Urology;  Laterality: N/A;  . NO PAST SURGERIES          Home Medications    Prior to Admission medications   Medication Sig Start Date End Date Taking? Authorizing Provider  ibuprofen (MOTRIN IB) 200 MG tablet Take 4 tablets (800 mg total) by mouth every 8 (eight) hours. Patient taking differently: Take 200 mg by mouth every 8 (eight) hours.  04/09/18  Yes Kathi Ludwig, MD  oxyCODONE-acetaminophen (PERCOCET/ROXICET) 5-325 MG tablet Take 1 tablet by mouth every 6 (six) hours as needed for severe pain. 04/09/18  Yes Kathi Ludwig, MD  ciprofloxacin (CIPRO) 500 MG tablet Take 1 tablet (500 mg total) by mouth 2 (two) times daily. 04/20/18   Kinnie Feil, PA-C  hydrocortisone (ANUSOL-HC) 25 MG suppository Use 1 suppository twice daily for 7 days. Patient not taking: Reported on 05/16/2017 03/08/17   Zehr, Laban Emperor, PA-C  naproxen (NAPROSYN) 500 MG tablet Take 1 tablet (500 mg total) by mouth 2 (two) times daily with a meal. Patient not taking: Reported on 05/16/2017 03/17/17   Sherwood Gambler, MD  ondansetron (ZOFRAN ODT) 4 MG disintegrating tablet Take 1 tablet (4 mg  total) by mouth every 8 (eight) hours as needed for nausea. Patient not taking: Reported on 04/20/2018 11/19/17   Larene Pickett, PA-C  senna-docusate (SENOKOT-S) 8.6-50 MG tablet Take 1 tablet by mouth 2 (two) times daily. While taking strong pain meds to prevent constipation. Patient not taking: Reported on 11/18/2017 10/04/17   Alexis Frock, MD  tamsulosin (FLOMAX) 0.4 MG CAPS capsule Take 1 capsule (0.4 mg total) by mouth daily for 15 days. 04/20/18 05/05/18  Kinnie Feil, PA-C    Family History Family History  Problem Relation Age of Onset  . Heart disease Mother   . Diabetes Father   . Hypertension Father   . Cirrhosis Father   . Heart disease Maternal Grandfather   . Melanoma Paternal Grandmother   . Lung cancer Paternal Uncle   . Lung cancer Paternal Aunt   . Asthma Daughter   . Gallbladder disease Daughter   . Erythema nodosum Daughter     Social History Social History   Tobacco Use  . Smoking status: Former Smoker    Packs/day: 1.00    Years: 1.00    Pack years: 1.00    Last attempt to quit: 07/30/1994    Years since quitting: 23.7  . Smokeless tobacco: Never Used  Substance Use Topics  . Alcohol use: No    Alcohol/week: 0.0 standard drinks  . Drug use: No     Allergies   Penicillins   Review of Systems Review of Systems  Gastrointestinal: Positive for abdominal pain (resolved) and nausea.  Genitourinary: Positive for dysuria and hematuria.  All other systems reviewed and are negative.    Physical Exam Updated Vital Signs BP 120/85   Pulse 91   Temp 98.6 F (37 C) (Oral)   Resp 17   SpO2 97%   Physical Exam  Constitutional: He is oriented to person, place, and time. He appears well-developed and well-nourished.  Morbidly obese.  In no distress.  HENT:  Head: Normocephalic and atraumatic.  Nose: Nose normal.  Eyes: Pupils are equal, round, and reactive to light. Conjunctivae and EOM are normal.  Neck: Normal range of motion.  Cardiovascular: Normal rate, regular rhythm and normal heart sounds.  Pulmonary/Chest: Effort normal and breath sounds normal.  Abdominal: Soft. Bowel sounds are normal. There is no tenderness.  Obese abdomen.  Nontender.  Soft.  No G/R/R. No suprapubic or CVA tenderness. Negative Murphy's and McBurney's  Musculoskeletal: Normal range of motion.  Neurological: He is alert and oriented to person, place, and time.  Skin: Skin is warm and dry. Capillary refill takes less than 2  seconds.  Psychiatric: He has a normal mood and affect. His behavior is normal. Judgment and thought content normal.  Nursing note and vitals reviewed.    ED Treatments / Results  Labs (all labs ordered are listed, but only abnormal results are displayed) Labs Reviewed  URINALYSIS, ROUTINE W REFLEX MICROSCOPIC - Abnormal; Notable for the following components:      Result Value   Color, Urine AMBER (*)    Hgb urine dipstick LARGE (*)    Ketones, ur 5 (*)    Protein, ur 30 (*)    Leukocytes, UA TRACE (*)    RBC / HPF >50 (*)    All other components within normal limits  CBC WITH DIFFERENTIAL/PLATELET - Abnormal; Notable for the following components:   Platelets 122 (*)    Lymphs Abs 0.5 (*)    All other components within normal limits  COMPREHENSIVE  METABOLIC PANEL - Abnormal; Notable for the following components:   Glucose, Bld 142 (*)    AST 46 (*)    ALT 47 (*)    Total Bilirubin 1.3 (*)    All other components within normal limits  URINE CULTURE  LIPASE, BLOOD    EKG None  Radiology Ct Renal Stone Study  Result Date: 04/20/2018 CLINICAL DATA:  Hematuria with left flank pain nausea and vomiting. EXAM: CT ABDOMEN AND PELVIS WITHOUT CONTRAST TECHNIQUE: Multidetector CT imaging of the abdomen and pelvis was performed following the standard protocol without IV contrast. COMPARISON:  11/18/2017 FINDINGS: Lower chest: Unremarkable Hepatobiliary: The liver shows diffusely decreased attenuation suggesting steatosis. No focal abnormality in the liver on this study without intravenous contrast. Dependent gallbladder sludge noted. No intrahepatic or extrahepatic biliary dilation. Pancreas: No focal mass lesion. No dilatation of the main duct. No intraparenchymal cyst. No peripancreatic edema. Spleen: No splenomegaly. No focal mass lesion. Adrenals/Urinary Tract: No adrenal nodule or mass. No stones are seen in the right kidney. 4 x 6 x 5 mm stone is identified at the right UPJ without  substantial right hydronephrosis. No right ureteral stone. 2 mm stone identified in the interpolar left kidney and 1 mm stone identified in the lower pole the left kidney. No left hydronephrosis. No left ureteral stone and no left hydroureter. No bladder stones. Stomach/Bowel: Stomach is nondistended. No gastric wall thickening. No evidence of outlet obstruction. Duodenum is normally positioned as is the ligament of Treitz. No small bowel wall thickening. No small bowel dilatation. The terminal ileum is normal. The appendix is normal. No gross colonic mass. No colonic wall thickening. No substantial diverticular change. Vascular/Lymphatic: No abdominal aortic aneurysm. No abdominal aortic atherosclerotic calcification. There is no gastrohepatic or hepatoduodenal ligament lymphadenopathy. No intraperitoneal or retroperitoneal lymphadenopathy. No pelvic sidewall lymphadenopathy. Reproductive: The prostate gland and seminal vesicles have normal imaging features. Other: No intraperitoneal free fluid. Musculoskeletal: No worrisome lytic or sclerotic osseous abnormality. Umbilical hernia contains only fat. IMPRESSION: 1. 4 x 6 x 5 mm right UPJ stone without substantial right hydronephrosis. 2. Tiny nonobstructing stones identified in the left kidney. 3. Umbilical hernia contains only fat. Electronically Signed   By: Misty Stanley M.D.   On: 04/20/2018 18:30    Procedures Procedures (including critical care time)  Medications Ordered in ED Medications  acetaminophen (TYLENOL) tablet 1,000 mg (1,000 mg Oral Given 04/20/18 1859)     Initial Impression / Assessment and Plan / ED Course  I have reviewed the triage vital signs and the nursing notes.  Pertinent labs & imaging results that were available during my care of the patient were reviewed by me and considered in my medical decision making (see chart for details).  Clinical Course as of Apr 20 2033  Nancy Fetter Apr 20, 2018  1716 Hgb urine dipstick(!): LARGE  [CG]  1717 Leukocytes, UA(!): TRACE [CG]  1717 WBC, UA: 11-20 [CG]  1717 RBC / HPF(!): >50 [CG]  1717 Bacteria, UA: NONE SEEN [CG]  1853 IMPRESSION: 1. 4 x 6 x 5 mm right UPJ stone without substantial right hydronephrosis. 2. Tiny nonobstructing stones identified in the left kidney. 3. Umbilical hernia contains only fat.   CT Renal Laren Everts [CG]    Clinical Course User Index [CG] Kinnie Feil, PA-C    42 year old with history of renal stones is here for dysuria and hematuria.  He had a brief episode of epigastric abdominal pain last night which resolved.  Considering UTI/Pilo versus  renal stone.  On exam he has a nontender abdomen without suprapubic or CVA tenderness.  Urinalysis with large hemoglobin, trace leukocytes, 11-20 WBCs but no bacteria seen.  We will send urine for culture.  Will obtain screening labs and CT renal to determine if there was a recently passed stone, obstructing stone or signs of perinephric stranding to suggest pyelonephritis.  2030: CT shows right UPJ stone without hydronephrosis.  Also tiny stones in the left kidney which patient was made aware of.  No perinephric stranding. Pt may have passed a stone already.  Umbilical hernia without signs of complication, he has no abdominal tenderness.  CT otherwise with gallbladder sludge and liver steatosis, normal appendix. Repeat abdominal exam is unchanged.  No episodes of nausea.  I recommended urethral swab to test for STDs although this is lower on differential given sexual practices and stone noted on CT, patient declined.  I think this is reasonable.  Given dysuria, WBCs and leukocytes noted, will cover with ciprofloxacin.  Will discharge with Flomax.  He takes daily Percocet for chronic back pain which she was instructed to take as well for any flank pain.  Discussed return precautions.  Patient is in agreement.  He is to follow-up with [facility]. Final Clinical Impressions(s) / ED Diagnoses   Final  diagnoses:  Dysuria  Gross hematuria  Right ureteral stone    ED Discharge Orders         Ordered    tamsulosin (FLOMAX) 0.4 MG CAPS capsule  Daily     04/20/18 2019    ciprofloxacin (CIPRO) 500 MG tablet  2 times daily     04/20/18 2020           Kinnie Feil, PA-C 04/20/18 2034    Fredia Sorrow, MD 04/21/18 [phone]

## 2018-04-20 NOTE — Discharge Instructions (Signed)
You were seen in the ER for burning with urination and blood in your urine.  CT shows a small stone in the uteropelvic junction of the right kidney.  This could explain your brief abdominal pain he had last night.  Your urine does not look convincing of an infection however given your symptoms, we will cover you with antibiotics.  Take your prescribed Percocet for pain.  Take Flomax to help dilate the ureter.  Take and complete ciprofloxacin.  Call [facility] tomorrow and schedule an appointment for reevaluation.  Return to the ER for worsening pain, vomiting, inability or difficulty voiding urine, fevers, chills.

## 2018-04-20 NOTE — ED Triage Notes (Signed)
Pt is c/o pain with urination and also noticing blood in his urine. States he suspects he may have passed a kidney stone but unsure. Remarks on hx of kidney stones.

## 2018-04-22 LAB — URINE CULTURE: Culture: >=100000 — AB

## 2018-04-23 ENCOUNTER — Telehealth: Payer: Self-pay | Admitting: Emergency Medicine

## 2018-04-23 NOTE — Telephone Encounter (Signed)
Post ED Visit - Positive Culture Follow-up  Culture report reviewed by antimicrobial stewardship pharmacist:  []  Elenor Quinones, Pharm.D. []  Heide Guile, Pharm.D., BCPS AQ-ID []  Parks Neptune, Pharm.D., BCPS []  Alycia Rossetti, Pharm.D., BCPS []  [place], Pharm.D., BCPS, AAHIVP []  Legrand Como, Pharm.D., BCPS, AAHIVP []  Salome Arnt, PharmD, BCPS []  Johnnette Gourd, PharmD, BCPS [x]  Hughes Better, PharmD, BCPS []  Leeroy Cha, PharmD  Positive urine culture Treated with ciprofloxacin, organism sensitive to the same and no further patient follow-up is required at this time.  Hazle Nordmann 04/23/2018, 10:55 AM

## 2018-04-24 ENCOUNTER — Emergency Department (HOSPITAL_BASED_OUTPATIENT_CLINIC_OR_DEPARTMENT_OTHER)
Admission: EM | Admit: 2018-04-24 | Discharge: 2018-04-24 | Disposition: A | Discharge status: Home / Self Care | Payer: Medicaid Other | Attending: Emergency Medicine | Admitting: Emergency Medicine

## 2018-04-24 ENCOUNTER — Other Ambulatory Visit: Payer: Self-pay

## 2018-04-24 ENCOUNTER — Encounter (HOSPITAL_BASED_OUTPATIENT_CLINIC_OR_DEPARTMENT_OTHER): Payer: Self-pay | Admitting: Emergency Medicine

## 2018-04-24 DIAGNOSIS — Y939 Activity, unspecified: Secondary | ICD-10-CM | POA: Insufficient documentation

## 2018-04-24 DIAGNOSIS — Z79899 Other long term (current) drug therapy: Secondary | ICD-10-CM | POA: Diagnosis not present

## 2018-04-24 DIAGNOSIS — I10 Essential (primary) hypertension: Secondary | ICD-10-CM | POA: Insufficient documentation

## 2018-04-24 DIAGNOSIS — Y999 Unspecified external cause status: Secondary | ICD-10-CM | POA: Diagnosis not present

## 2018-04-24 DIAGNOSIS — J45909 Unspecified asthma, uncomplicated: Secondary | ICD-10-CM | POA: Diagnosis not present

## 2018-04-24 DIAGNOSIS — Z87891 Personal history of nicotine dependence: Secondary | ICD-10-CM | POA: Insufficient documentation

## 2018-04-24 DIAGNOSIS — S40921A Unspecified superficial injury of right upper arm, initial encounter: Secondary | ICD-10-CM | POA: Diagnosis present

## 2018-04-24 DIAGNOSIS — X58XXXA Exposure to other specified factors, initial encounter: Secondary | ICD-10-CM | POA: Diagnosis not present

## 2018-04-24 DIAGNOSIS — Y929 Unspecified place or not applicable: Secondary | ICD-10-CM | POA: Insufficient documentation

## 2018-04-24 DIAGNOSIS — S46211A Strain of muscle, fascia and tendon of other parts of biceps, right arm, initial encounter: Secondary | ICD-10-CM | POA: Diagnosis not present

## 2018-04-24 NOTE — Discharge Instructions (Signed)
Take 4 over the counter ibuprofen tablets 3 times a day or 2 over-the-counter naproxen tablets twice a day for pain. Also take tylenol 1000mg (2 extra strength) four times a day.   Take your arm out of the sling and move it around at least 4 times a day.

## 2018-04-24 NOTE — ED Triage Notes (Signed)
Pt is c/o right arm pain  Pt states the pain is in his elbow area  Denies injury  Pt states pain started yesterday

## 2018-04-24 NOTE — ED Provider Notes (Signed)
[facility] Provider Note   CSN: [id number] Arrival date & time: 04/24/18  1913     History   Chief Complaint Chief Complaint  Patient presents with  . Arm Pain    HPI Reginald Campbell is a 42 y.o. male.  42 yo M with a chief complaint of pain to the right arm.  This been going on for the past couple days.  Denies specific injury.  Feels pain worse when he tries to extend that arm.  Noted to the distal upper arm.  No other noted areas of pain.  Denies pain in the shoulder.  Denies pain in the wrist.  The history is provided by the patient.  Arm Pain  This is a new problem. The current episode started 2 days ago. The problem occurs constantly. The problem has not changed since onset.Pertinent negatives include no chest pain, no abdominal pain, no headaches and no shortness of breath. The symptoms are aggravated by bending. Nothing relieves the symptoms. He has tried nothing for the symptoms. The treatment provided no relief.    Past Medical History:  Diagnosis Date  . Arthritis   . Asthma   . Back pain   . BRBPR (bright red blood per rectum) 04/07/2015  . Depression   . Family history of adverse reaction to anesthesia    daughter under anesthesia for 6 hours had problems with N/V   . History of kidney stones   . Hypertension    hx of elevated bp, no meds for htn  . Phimosis   . Sleep apnea    does not use cpap due to broke    Patient Active Problem List   Diagnosis Date Noted  . Chronic bilateral low back pain with bilateral sciatica 04/09/2018  . Internal hemorrhoids 03/08/2017  . Rectal bleeding 03/07/2017  . 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 Morbid obesity ([place]) 10/02/2016  . Lumbar radiculopathy, chronic 10/01/2016  . MDD (major depressive disorder) 10/01/2016  . Bilateral shoulder pain 08/18/2015  . Nephrolithiasis 04/18/2015  . Healthcare maintenance 04/07/2015  . Family history of diabetes mellitus 04/07/2015  .  Obstructive sleep apnea 04/07/2015    Past Surgical History:  Procedure Laterality Date  . CIRCUMCISION N/A 03/27/2017   Procedure: CIRCUMCISION ADULT with canal block;  Surgeon: Alexis Frock, MD;  Location: [facility];  Service: Urology;  Laterality: N/A;  . CIRCUMCISION REVISION N/A 10/04/2017   Procedure: CIRCUMCISION REVISION;  Surgeon: Alexis Frock, MD;  Location: [facility];  Service: Urology;  Laterality: N/A;  . NO PAST SURGERIES          Home Medications    Prior to Admission medications   Medication Sig Start Date End Date Taking? Authorizing Provider  ciprofloxacin (CIPRO) 500 MG tablet Take 1 tablet (500 mg total) by mouth 2 (two) times daily. 04/20/18   Kinnie Feil, PA-C  hydrocortisone (ANUSOL-HC) 25 MG suppository Use 1 suppository twice daily for 7 days. Patient not taking: Reported on 05/16/2017 03/08/17   Zehr, Laban Emperor, PA-C  ibuprofen (MOTRIN IB) 200 MG tablet Take 4 tablets (800 mg total) by mouth every 8 (eight) hours. Patient taking differently: Take 200 mg by mouth every 8 (eight) hours.  04/09/18   Kathi Ludwig, MD  naproxen (NAPROSYN) 500 MG tablet Take 1 tablet (500 mg total) by mouth 2 (two) times daily with a meal. Patient not taking: Reported on 05/16/2017 03/17/17   Sherwood Gambler, MD  ondansetron (ZOFRAN ODT) 4 MG disintegrating tablet  Take 1 tablet (4 mg total) by mouth every 8 (eight) hours as needed for nausea. Patient not taking: Reported on 04/20/2018 11/19/17   Larene Pickett, PA-C  oxyCODONE-acetaminophen (PERCOCET/ROXICET) 5-325 MG tablet Take 1 tablet by mouth every 6 (six) hours as needed for severe pain. 04/09/18   Kathi Ludwig, MD  senna-docusate (SENOKOT-S) 8.6-50 MG tablet Take 1 tablet by mouth 2 (two) times daily. While taking strong pain meds to prevent constipation. Patient not taking: Reported on 11/18/2017 10/04/17   Alexis Frock, MD  tamsulosin (FLOMAX) 0.4 MG CAPS capsule Take 1 capsule (0.4 mg total) by mouth daily for  15 days. 04/20/18 05/05/18  Kinnie Feil, PA-C    Family History Family History  Problem Relation Age of Onset  . Heart disease Mother   . Diabetes Father   . Hypertension Father   . Cirrhosis Father   . Heart disease Maternal Grandfather   . Melanoma Paternal Grandmother   . Lung cancer Paternal Uncle   . Lung cancer Paternal Aunt   . Asthma Daughter   . Gallbladder disease Daughter   . Erythema nodosum Daughter     Social History Social History   Tobacco Use  . Smoking status: Former Smoker    Packs/day: 1.00    Years: 1.00    Pack years: 1.00    Last attempt to quit: 07/30/1994    Years since quitting: 23.7  . Smokeless tobacco: Never Used  Substance Use Topics  . Alcohol use: No    Alcohol/week: 0.0 standard drinks  . Drug use: No     Allergies   Penicillins   Review of Systems Review of Systems  Constitutional: Negative for chills and fever.  HENT: Negative for congestion and facial swelling.   Eyes: Negative for discharge and visual disturbance.  Respiratory: Negative for shortness of breath.   Cardiovascular: Negative for chest pain and palpitations.  Gastrointestinal: Negative for abdominal pain, diarrhea and vomiting.  Musculoskeletal: Positive for arthralgias and myalgias.  Skin: Negative for color change and rash.  Neurological: Negative for tremors, syncope and headaches.  Psychiatric/Behavioral: Negative for confusion and dysphoric mood.     Physical Exam Updated Vital Signs BP 135/86 (BP Location: Left Arm)   Pulse 77   Temp 97.7 F (36.5 C) (Oral)   Resp 18   Ht 5\' 11"  (1.803 m)   Wt (!) 165.1 kg   SpO2 98%   BMI 50.77 kg/m   Physical Exam  Constitutional: He is oriented to person, place, and time. He appears well-developed and well-nourished.  HENT:  Head: Normocephalic and atraumatic.  Eyes: Pupils are equal, round, and reactive to light. EOM are normal.  Neck: Normal range of motion. Neck supple. No JVD present.    Cardiovascular: Normal rate and regular rhythm. Exam reveals no gallop and no friction rub.  No murmur heard. Pulmonary/Chest: No respiratory distress. He has no wheezes.  Abdominal: He exhibits no distension. There is no rebound and no guarding.  Musculoskeletal: Normal range of motion. He exhibits tenderness.  Right distal biceps pain.  Limited extension due to pain to that same area.  Just distally in the [facility] the patient does have a small hematoma.  Pulse motor and sensation is intact distally.  No bony tenderness.  No pain to the shoulder.  Neurological: He is alert and oriented to person, place, and time.  Skin: No rash noted. No pallor.  Psychiatric: He has a normal mood and affect. His behavior is normal.  Nursing  note and vitals reviewed.    ED Treatments / Results  Labs (all labs ordered are listed, but only abnormal results are displayed) Labs Reviewed - No data to display  EKG None  Radiology No results found.  Procedures Procedures (including critical care time)  Medications Ordered in ED Medications - No data to display   Initial Impression / Assessment and Plan / ED Course  I have reviewed the triage vital signs and the nursing notes.  Pertinent labs & imaging results that were available during my care of the patient were reviewed by me and considered in my medical decision making (see chart for details).     42 yo M with a chief complaint of right arm pain.  This is localized to the biceps muscle.  There is some small palpable deformity there.  I suspect that he has a muscular tear.  He otherwise is intact pulse motor and sensation.  He has no bony tenderness.  There is no pain to the medial or lateral epicondyle of the elbow.  We will give him follow-up for sports medicine if he so chooses.  We will immobilize in a sling.  Tylenol and ibuprofen.  9:02 PM:  I have discussed the diagnosis/risks/treatment options with the patient and believe the pt to be eligible  for discharge home to follow-up with PCP, sports med. We also discussed returning to the ED immediately if new or worsening sx occur. We discussed the sx which are most concerning (e.g., sudden worsening pain, fever, inability to tolerate by mouth) that necessitate immediate return. Medications administered to the patient during their visit and any new prescriptions provided to the patient are listed below.  Medications given during this visit Medications - No data to display    The patient appears reasonably screen and/or stabilized for discharge and I doubt any other medical condition or other [facility] requiring further screening, evaluation, or treatment in the ED at this time prior to discharge.    Final Clinical Impressions(s) / ED Diagnoses   Final diagnoses:  Tear of right biceps muscle, initial encounter    ED Discharge Orders    None       Deno Etienne, DO 04/24/18 2102

## 2018-05-08 ENCOUNTER — Telehealth: Payer: Self-pay | Admitting: Internal Medicine

## 2018-06-05 DIAGNOSIS — N4883 Acquired buried penis: Secondary | ICD-10-CM | POA: Diagnosis not present

## 2018-06-05 DIAGNOSIS — R311 Benign essential microscopic hematuria: Secondary | ICD-10-CM | POA: Diagnosis not present

## 2018-06-16 DIAGNOSIS — R311 Benign essential microscopic hematuria: Secondary | ICD-10-CM | POA: Diagnosis not present

## 2018-06-16 DIAGNOSIS — N200 Calculus of kidney: Secondary | ICD-10-CM | POA: Diagnosis not present

## 2018-06-16 DIAGNOSIS — N4883 Acquired buried penis: Secondary | ICD-10-CM | POA: Diagnosis not present

## 2018-06-16 DIAGNOSIS — N471 Phimosis: Secondary | ICD-10-CM | POA: Diagnosis not present

## 2018-06-17 NOTE — Addendum Note (Signed)
Addended by: Nicola Girt on: 06/17/2018 01:54 PM   Modules accepted: Orders

## 2018-06-30 DIAGNOSIS — N4883 Acquired buried penis: Secondary | ICD-10-CM | POA: Diagnosis not present

## 2018-06-30 DIAGNOSIS — R310 Gross hematuria: Secondary | ICD-10-CM | POA: Diagnosis not present

## 2018-06-30 DIAGNOSIS — N200 Calculus of kidney: Secondary | ICD-10-CM | POA: Diagnosis not present

## 2018-07-31 DIAGNOSIS — N132 Hydronephrosis with renal and ureteral calculous obstruction: Secondary | ICD-10-CM | POA: Diagnosis not present

## 2018-07-31 DIAGNOSIS — N200 Calculus of kidney: Secondary | ICD-10-CM | POA: Diagnosis not present

## 2018-08-05 DIAGNOSIS — R310 Gross hematuria: Secondary | ICD-10-CM | POA: Diagnosis not present

## 2018-08-05 DIAGNOSIS — N200 Calculus of kidney: Secondary | ICD-10-CM | POA: Diagnosis not present

## 2018-08-05 DIAGNOSIS — N4883 Acquired buried penis: Secondary | ICD-10-CM | POA: Diagnosis not present

## 2018-08-11 DIAGNOSIS — H40013 Open angle with borderline findings, low risk, bilateral: Secondary | ICD-10-CM | POA: Diagnosis not present

## 2018-08-11 DIAGNOSIS — H04123 Dry eye syndrome of bilateral lacrimal glands: Secondary | ICD-10-CM | POA: Diagnosis not present

## 2018-08-11 DIAGNOSIS — D3132 Benign neoplasm of left choroid: Secondary | ICD-10-CM | POA: Diagnosis not present

## 2018-08-11 DIAGNOSIS — H47323 Drusen of optic disc, bilateral: Secondary | ICD-10-CM | POA: Diagnosis not present

## 2018-10-03 ENCOUNTER — Other Ambulatory Visit: Payer: Self-pay

## 2018-10-03 ENCOUNTER — Emergency Department (HOSPITAL_COMMUNITY)
Admission: EM | Admit: 2018-10-03 | Discharge: 2018-10-03 | Disposition: A | Discharge status: Home / Self Care | Payer: Medicaid Other | Attending: Emergency Medicine | Admitting: Emergency Medicine

## 2018-10-03 ENCOUNTER — Encounter (HOSPITAL_COMMUNITY): Payer: Self-pay

## 2018-10-03 DIAGNOSIS — M5441 Lumbago with sciatica, right side: Secondary | ICD-10-CM | POA: Diagnosis not present

## 2018-10-03 DIAGNOSIS — I10 Essential (primary) hypertension: Secondary | ICD-10-CM | POA: Diagnosis not present

## 2018-10-03 DIAGNOSIS — Z87891 Personal history of nicotine dependence: Secondary | ICD-10-CM | POA: Diagnosis not present

## 2018-10-03 DIAGNOSIS — M5431 Sciatica, right side: Secondary | ICD-10-CM | POA: Diagnosis not present

## 2018-10-03 DIAGNOSIS — M545 Low back pain: Secondary | ICD-10-CM | POA: Diagnosis present

## 2018-10-03 DIAGNOSIS — Z79899 Other long term (current) drug therapy: Secondary | ICD-10-CM | POA: Insufficient documentation

## 2018-10-03 DIAGNOSIS — J45909 Unspecified asthma, uncomplicated: Secondary | ICD-10-CM | POA: Insufficient documentation

## 2018-10-03 LAB — URINALYSIS, ROUTINE W REFLEX MICROSCOPIC
Bilirubin Urine: NEGATIVE
GLUCOSE, UA: NEGATIVE mg/dL
HGB URINE DIPSTICK: NEGATIVE
Ketones, ur: NEGATIVE mg/dL
LEUKOCYTE UA: NEGATIVE
Nitrite: NEGATIVE
PH: 7 (ref 5.0–8.0)
Protein, ur: NEGATIVE mg/dL
Specific Gravity, Urine: 1.018 (ref 1.005–1.030)

## 2018-10-03 LAB — CBG MONITORING, ED: Glucose-Capillary: 104 mg/dL — ABNORMAL HIGH (ref 70–99)

## 2018-10-03 MED ORDER — PREDNISONE 20 MG PO TABS: 20.0000 mg | ORAL_TABLET | Freq: Two times a day (BID) | ORAL | 0 refills | Status: AC

## 2018-10-03 MED ORDER — LIDOCAINE 5 % EX PTCH
1.0000 | MEDICATED_PATCH | Freq: Once | CUTANEOUS | Status: DC
  Administered 2018-10-03: 1 via TRANSDERMAL
  Filled 2018-10-03: qty 1

## 2018-10-03 MED ORDER — NAPROXEN 500 MG PO TABS: 500.0000 mg | ORAL_TABLET | Freq: Two times a day (BID) | ORAL | 0 refills | Status: AC

## 2018-10-03 MED ORDER — METHOCARBAMOL 500 MG PO TABS: 500.0000 mg | ORAL_TABLET | Freq: Two times a day (BID) | ORAL | 0 refills | Status: AC

## 2018-10-03 MED ORDER — ACETAMINOPHEN 325 MG PO TABS: 650.0000 mg | ORAL_TABLET | Freq: Four times a day (QID) | ORAL | 0 refills | Status: DC | PRN

## 2018-10-03 MED ORDER — ACETAMINOPHEN 500 MG PO TABS
1000.0000 mg | ORAL_TABLET | Freq: Once | ORAL | Status: AC
  Administered 2018-10-03: 1000 mg via ORAL
  Filled 2018-10-03: qty 2

## 2018-10-03 NOTE — ED Provider Notes (Signed)
[facility] EMERGENCY DEPARTMENT Provider Note   CSN: [id number] Arrival date & time: 10/03/18  1240    History   Chief Complaint Chief Complaint  Patient presents with  . Wrist Pain  . Back Pain    HPI ERVAN HEBER is a 43 y.o. male.     HPI  Patient is a 43 year old male with a history of morbid obesity, asthma, depression, hypertension presenting for right-sided low back pain rating down his leg and bilateral wrist pain.  Patient reports that the back pain began 5 days ago.  He reports that it is a dull ache except with movement when it becomes [place].  He reports it is radiating down the back of the leg and will cause his knee to bend due to the pain.  Denies numbness.  Denies bilateral leg weakness.  Denies saddle anesthesia, loss of bowel bladder control, fever chills, IVDU, or cancer history.  He does have a history of nephrolithiasis, however he reports that this pain feels a bit different except when it is [place].  Denies hematuria, dysuria, urgency, or frequency.  Past Medical History:  Diagnosis Date  . Arthritis   . Asthma   . Back pain   . BRBPR (bright red blood per rectum) 04/07/2015  . Depression   . Family history of adverse reaction to anesthesia    daughter under anesthesia for 6 hours had problems with N/V   . History of kidney stones   . Hypertension    hx of elevated bp, no meds for htn  . Phimosis   . Sleep apnea    does not use cpap due to broke    Patient Active Problem List   Diagnosis Date Noted  . Chronic bilateral low back pain with bilateral sciatica 04/09/2018  . Internal hemorrhoids 03/08/2017  . Rectal bleeding 03/07/2017  . 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 Morbid obesity ([place]) 10/02/2016  . Lumbar radiculopathy, chronic 10/01/2016  . MDD (major depressive disorder) 10/01/2016  . Bilateral shoulder pain 08/18/2015  . Nephrolithiasis 04/18/2015  . Healthcare maintenance 04/07/2015  . Family history of  diabetes mellitus 04/07/2015  . Obstructive sleep apnea 04/07/2015    Past Surgical History:  Procedure Laterality Date  . CIRCUMCISION N/A 03/27/2017   Procedure: CIRCUMCISION ADULT with canal block;  Surgeon: Alexis Frock, MD;  Location: [facility];  Service: Urology;  Laterality: N/A;  . CIRCUMCISION REVISION N/A 10/04/2017   Procedure: CIRCUMCISION REVISION;  Surgeon: Alexis Frock, MD;  Location: [facility];  Service: Urology;  Laterality: N/A;  . NO PAST SURGERIES          Home Medications    Prior to Admission medications   Medication Sig Start Date End Date Taking? Authorizing Provider  ciprofloxacin (CIPRO) 500 MG tablet Take 1 tablet (500 mg total) by mouth 2 (two) times daily. 04/20/18   Kinnie Feil, PA-C  hydrocortisone (ANUSOL-HC) 25 MG suppository Use 1 suppository twice daily for 7 days. Patient not taking: Reported on 05/16/2017 03/08/17   Zehr, Laban Emperor, PA-C  ibuprofen (MOTRIN IB) 200 MG tablet Take 4 tablets (800 mg total) by mouth every 8 (eight) hours. Patient taking differently: Take 200 mg by mouth every 8 (eight) hours.  04/09/18   Kathi Ludwig, MD  naproxen (NAPROSYN) 500 MG tablet Take 1 tablet (500 mg total) by mouth 2 (two) times daily with a meal. Patient not taking: Reported on 05/16/2017 03/17/17   Sherwood Gambler, MD  ondansetron (ZOFRAN ODT) 4 MG  disintegrating tablet Take 1 tablet (4 mg total) by mouth every 8 (eight) hours as needed for nausea. Patient not taking: Reported on 04/20/2018 11/19/17   Larene Pickett, PA-C  oxyCODONE-acetaminophen (PERCOCET/ROXICET) 5-325 MG tablet Take 1 tablet by mouth every 6 (six) hours as needed for severe pain. 04/09/18   Kathi Ludwig, MD  senna-docusate (SENOKOT-S) 8.6-50 MG tablet Take 1 tablet by mouth 2 (two) times daily. While taking strong pain meds to prevent constipation. Patient not taking: Reported on 11/18/2017 10/04/17   Alexis Frock, MD    Family History Family History  Problem Relation  Age of Onset  . Heart disease Mother   . Diabetes Father   . Hypertension Father   . Cirrhosis Father   . Heart disease Maternal Grandfather   . Melanoma Paternal Grandmother   . Lung cancer Paternal Uncle   . Lung cancer Paternal Aunt   . Asthma Daughter   . Gallbladder disease Daughter   . Erythema nodosum Daughter     Social History Social History   Tobacco Use  . Smoking status: Former Smoker    Packs/day: 1.00    Years: 1.00    Pack years: 1.00    Last attempt to quit: 07/30/1994    Years since quitting: 24.1  . Smokeless tobacco: Never Used  Substance Use Topics  . Alcohol use: No    Alcohol/week: 0.0 standard drinks  . Drug use: No     Allergies   Penicillins   Review of Systems Review of Systems  Constitutional: Negative for chills and fever.  Gastrointestinal: Negative for abdominal pain, nausea and vomiting.  Genitourinary: Negative for dysuria, flank pain, hematuria and testicular pain.  Musculoskeletal: Positive for arthralgias, back pain and myalgias.  Neurological: Negative for weakness and numbness.    Physical Exam Updated Vital Signs BP (!) 149/83   Pulse 72   Temp 97.8 F (36.6 C) (Oral)   Resp 20   Ht 5\' 11"  (1.803 m)   Wt (!) 158.8 kg   SpO2 97%   BMI 48.82 kg/m   Physical Exam Vitals signs and nursing note reviewed.  Constitutional:      General: He is not in acute distress.    Appearance: He is well-developed. He is not diaphoretic.     Comments: Sitting comfortably in bed.  HENT:     Head: Normocephalic and atraumatic.     Mouth/Throat:     Mouth: Mucous membranes are moist.  Eyes:     General:        Right eye: No discharge.        Left eye: No discharge.     Conjunctiva/sclera: Conjunctivae normal.     Comments: EOMs normal to gross examination.  Neck:     Musculoskeletal: Normal range of motion.  Cardiovascular:     Rate and Rhythm: Normal rate and regular rhythm.     Pulses: Normal pulses.     Comments: Intact, 2+  DP pulses.  Abdominal:     General: There is no distension.  Musculoskeletal: Normal range of motion.        General: Tenderness present.     Comments: Spine Exam: Inspection/Palpation: No midline tenderness of cervical, thoracic, or lumbar spine.  Patient has diffuse right-sided paraspinal muscular tenderness. Strength: 5/5 throughout LE bilaterally (hip flexion/extension, adduction/abduction; knee flexion/extension; foot dorsiflexion/plantarflexion, inversion/eversion; great toe inversion) Sensation: Intact to light touch in proximal and distal LE bilaterally Reflexes: 2+ quadriceps and achilles reflexes Normal and symmetric gait.  Skin:    General: Skin is warm and dry.  Neurological:     Mental Status: He is alert.     Comments: Cranial nerves intact to gross observation. Patient moves extremities without difficulty.  Psychiatric:        Behavior: Behavior normal.        Thought Content: Thought content normal.        Judgment: Judgment normal.      ED Treatments / Results  Labs (all labs ordered are listed, but only abnormal results are displayed) Labs Reviewed  CBG MONITORING, ED - Abnormal; Notable for the following components:      Result Value   Glucose-Capillary 104 (*)    All other components within normal limits  URINALYSIS, ROUTINE W REFLEX MICROSCOPIC    EKG None  Radiology No results found.  Procedures Procedures (including critical care time)  Medications Ordered in ED Medications - No data to display   Initial Impression / Assessment and Plan / ED Course  I have reviewed the triage vital signs and the nursing notes.  Pertinent labs & imaging results that were available during my care of the patient were reviewed by me and considered in my medical decision making (see chart for details).        Patient denies any concerning symptoms suggestive of cauda equina requiring urgent imaging at this time such as loss of sensation in the lower  extremities, lower extremity weakness, loss of bowel or bladder control, saddle anesthesia, urinary retention, fever/chills, IVDU. Exam demonstrated no  weakness on exam today. No preceding injury or trauma to suggest acute fracture. Doubt pelvic or urinary pathology for patient's acute back pain, as patient denies urinary symptoms, has no evidence of infection on UA, has no CVA tenderness, history/pain not consistent with nephrolithiasis. Doubt AAA as cause of patient's back pain as patient lacks major risk factors, had no abdominal TTP, and has symmetric and intact distal pulses.  Will treat for radiculopathy with steroids, muscle relaxants, NSAIDs, and Tylenol.  CBG performed in the ER which is nonelevated.  No history of diabetes.  Patient given strict return precautions for any symptoms indicating worsening neurologic function in the lower extremities.  Patient is in understanding and agrees with plan of care.  Final Clinical Impressions(s) / ED Diagnoses   Final diagnoses:  Acute right-sided low back pain with right-sided sciatica  Elevated blood pressure reading with diagnosis of hypertension    ED Discharge Orders         Ordered    predniSONE (DELTASONE) 20 MG tablet  2 times daily with meals     10/03/18 1507    methocarbamol (ROBAXIN) 500 MG tablet  2 times daily     10/03/18 1507    naproxen (NAPROSYN) 500 MG tablet  2 times daily     10/03/18 1507    acetaminophen (TYLENOL) 325 MG tablet  Every 6 hours PRN     10/03/18 1507           Tamala Julian 10/03/18 1510    Quintella Reichert, MD 10/03/18 [place]ly [place]

## 2018-10-03 NOTE — Discharge Instructions (Signed)
Please see the information and instructions below regarding your visit.  Your diagnoses today include:  1. Acute right-sided low back pain with right-sided sciatica   2. Elevated blood pressure reading with diagnosis of hypertension    About diagnosis. Most episodes of acute low back pain are self-limited. Your exam was reassuring today that the source of your pain is not affecting the spinal cord and nerves that originate in the spinal cord.   If you have a history of disc herniation or arthritis in your spine, the nerves exiting the spine on one side get inflamed. This can cause severe pain. We call this radiculopathy. We do not always know what causes the sudden inflammation.  Tests performed today include: See side panel of your discharge paperwork for testing performed today. Vital signs are listed at the bottom of these instructions.   Medications prescribed:    Take any prescribed medications only as prescribed, and any over the counter medications only as directed on the packaging.  You are prescribed Robaxin, a muscle relaxant. Some common side effects of this medication include:  Feeling sleepy.  Dizziness. Take care upon going from a seated to a standing position.  Dry mouth.  Feeling tired or weak.  Hard stools (constipation).  Upset stomach. These are not all of the side effects that may occur. If you have questions about side effects, call your doctor. Call your primary care provider for medical advice about side effects.  This medication can be sedating. Only take this medication as needed. Please do not combine with alcohol. Do not drive or operate machinery while taking this medication.   This medication can interact with some other medications. Make sure to tell any provider you are taking this medication before they prescribe you a new medication.   You are prescribed Naproxen, a non-steroidal anti-inflammatory agent (NSAID) for pain. You may take 500 mg every 12  hours as needed for pain. If still requiring this medication around the clock for acute pain after 10 days, please see your primary healthcare provider.  Women who are pregnant, breastfeeding, or planning on becoming pregnant should not take non-steroidal anti-inflammatories such as Advil and Aleve. Tylenol is a safe over the counter pain reliever in pregnant women.  You may combine this medication with Tylenol, 650 mg every 6 hours, so you are receiving something for pain every 3 hours.  This is not a long-term medication unless under the care and direction of your primary provider. Taking this medication long-term and not under the supervision of a healthcare provider could increase the risk of stomach ulcers, kidney problems, and cardiovascular problems such as high blood pressure.   You are prescribed prednisone, a steroid. This is a medication to help reduce inflammation in the spine.  Common side effects include upset stomach/nausea. You may take this medicine with food if this occurs. Other side effects include restlessness, difficulty sleeping, and increased sweating. Call your healthcare provider if these do not resolve after finishing the medication.  This medicine may increase your blood sugar so additional careful monitoring is needed of blood sugar if you have diabetes. Call your healthcare provider for any signs/symtpoms of high blood sugar such as confusion, feeling sleepy, more thirst, more hunger, passing urine more often, flushing, fast breathing, or breath that smells like fruit.  Home care instructions:   Low back pain gets worse the longer you stay stationary. Please keep moving and walking as tolerated. There are exercises included in this packet to perform  as tolerated for your low back pain.   Apply heat to the areas that are painful. Avoid twisting or bending your trunk to lift something. Do not lift anything above 25 lbs while recovering from this flare of low back  pain.  Please follow any educational materials contained in this packet.   Follow-up instructions: Please follow-up with your primary care provider in one week for further evaluation of your symptoms if they are not completely improved.   Return instructions:  Please return to the Emergency Department if you experience worsening symptoms.  Please return for any fever or chills in the setting of your back pain, weakness in the muscles of the legs, numbness in your legs and feet that is new or changing, numbness in the area where you wipe, retention of your urine, loss of bowel or bladder control, or problems with walking. Please return if you have any other emergent concerns.  Additional Information:   Your vital signs today were: BP (!) 149/83    Pulse 72    Temp 97.8 F (36.6 C) (Oral)    Resp 20    Ht 5\' 11"  (1.803 m)    Wt (!) 158.8 kg    SpO2 97%    BMI 48.82 kg/m  If your blood pressure (BP) was elevated on multiple readings during this visit above 130 for the top number or above 80 for the bottom number, please have this repeated by your primary care provider within one month. --------------  Thank you for allowing [place] to participate in your care today.

## 2018-10-03 NOTE — ED Triage Notes (Signed)
Pt reports bilateral wrist pain x1 week that started 1 day after picking up leaves in yard. Pt reports no pain currently, only with movement/twisting. Pt endorses right lower back pain that radiates down right leg, worse with movement. Pt denies urinary s/s. Hx kidney stone, but doesn't feel the same.

## 2018-10-22 ENCOUNTER — Telehealth (INDEPENDENT_AMBULATORY_CARE_PROVIDER_SITE_OTHER): Payer: Medicaid Other | Admitting: Internal Medicine

## 2018-10-22 ENCOUNTER — Encounter: Payer: Self-pay | Admitting: Internal Medicine

## 2018-10-22 ENCOUNTER — Other Ambulatory Visit: Payer: Self-pay

## 2018-10-22 DIAGNOSIS — G4733 Obstructive sleep apnea (adult) (pediatric): Secondary | ICD-10-CM | POA: Diagnosis not present

## 2018-10-22 DIAGNOSIS — G8929 Other chronic pain: Secondary | ICD-10-CM

## 2018-10-22 DIAGNOSIS — M5442 Lumbago with sciatica, left side: Secondary | ICD-10-CM

## 2018-10-22 DIAGNOSIS — I10 Essential (primary) hypertension: Secondary | ICD-10-CM

## 2018-10-22 DIAGNOSIS — M5441 Lumbago with sciatica, right side: Secondary | ICD-10-CM

## 2018-10-22 NOTE — Progress Notes (Signed)
Internal Medicine Clinic Attending  Case discussed with Dr. Berline Lopes.  We reviewed the resident's history and exam and pertinent patient test results.  I agree with the assessment, diagnosis, and plan of care documented in the resident's note.

## 2018-10-22 NOTE — Assessment & Plan Note (Signed)
HTN: 120/78 average. Has been elevated at recent ER visits for which he attributes said elevation to the pain. He denied hypertension at home while at rest.   Plan:  Assess at next visit, consider additional lifestyle modifications such as weight loss

## 2018-10-22 NOTE — Assessment & Plan Note (Signed)
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 As such, he continues to remain untreated for his severe OSA which I feel is contributing to his chronic pain.   Plan: I have advised him that he needs to discuss alternative living arrangements for his health

## 2018-10-22 NOTE — Progress Notes (Signed)
This is a telephone encounter between [name] and [employer] on 10/22/2018 for OSA, HTN and back pain. The visit was conducted with the patient located at home and [employer] at [facility]. The patient's identity was confirmed using their DOB and current address. The patient has consented to being evaluated through a telephone encounter and understands the associated risks (an examination cannot be done and the patient may need to come in for an appointment) / benefits (allows the patient to remain at home, decreasing exposure to coronavirus). I personally spent 15 on medical discussion.     CC: OSA and HTN  HPI:[name] is a 43 y.o. male who was called for evaluation of OSA and HTN. Please see individual problem based A/P for details.  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 As such, he continues to remain untreated for his severe OSA which I feel is contributing to his chronic pain.   Plan: I have advised him that he needs to discuss alternative living arrangements for his health  HTN: 120/78 average. Has been elevated at recent ER visits for which he attributes said elevation to the pain. He denied hypertension at home while at rest.   Plan:  Assess at next visit, consider additional lifestyle modifications such as weight loss  Back Pain: He stated that driving, sitting for long periods of time or walking for more than an hour or two. He is able to pinpoint the lower right side of his back above the level of the umbilicus dorsally. No numbness or tingling in his feet at rest, but he does endorse pain and weakness bilaterally when the back pain is severe. He denied changes in bowel or bladder.  Has taken excessive amounts of tylenol, ibuprofen, bayer, Excedrin without relief. He has visited the  Hospital multiple times for the pain.   Plan:  Plain films of the thoracic/lumbar spine previously ordered to evaluate his back pain Can consider PT, but his insurance would only provide one session Has not tried water aerobics, but did try some pool exercise which worsens the pain. We will attempt to obtain the plain films and from there proceed to possible pain management consult.    Past Medical History:  Diagnosis Date  . Arthritis   . Asthma   . Back pain   . BRBPR (bright red blood per rectum) 04/07/2015  . Depression   . Family history of adverse reaction to anesthesia    daughter under anesthesia for 6 hours had problems with N/V   . History of kidney stones   . Hypertension    hx of elevated bp, no meds for htn  . Phimosis   . Sleep apnea    does not use cpap due to broke   Review of Systems:  ROS negative except as per HPI.  Assessment & Plan:   See Encounters Tab for problem based charting.  Patient discussed with Dr. Evette Doffing

## 2018-10-22 NOTE — Assessment & Plan Note (Signed)
Back Pain: He stated that driving, sitting for long periods of time or walking for more than an hour or two. He is able to pinpoint the lower right side of his back above the level of the umbilicus dorsally. No numbness or tingling in his feet at rest, but he does endorse pain and weakness bilaterally when the back pain is severe. He denied changes in bowel or bladder.  Has taken excessive amounts of tylenol, ibuprofen, bayer, Excedrin without relief. He has visited the Hospital multiple times for the pain.   Plan:  Plain films of the thoracic/lumbar spine previously ordered to evaluate his back pain Can consider PT, but his insurance would only provide one session Has not tried water aerobics, but did try some pool exercise which worsens the pain. We will attempt to obtain the plain films and from there proceed to possible pain management consult.

## 2018-12-23 DIAGNOSIS — N4883 Acquired buried penis: Secondary | ICD-10-CM | POA: Diagnosis not present

## 2018-12-23 DIAGNOSIS — N200 Calculus of kidney: Secondary | ICD-10-CM | POA: Diagnosis not present

## 2019-02-10 DIAGNOSIS — N200 Calculus of kidney: Secondary | ICD-10-CM | POA: Diagnosis not present

## 2019-02-10 DIAGNOSIS — N4883 Acquired buried penis: Secondary | ICD-10-CM | POA: Diagnosis not present

## 2019-03-12 ENCOUNTER — Encounter (INDEPENDENT_AMBULATORY_CARE_PROVIDER_SITE_OTHER): Payer: Self-pay

## 2019-03-12 ENCOUNTER — Encounter: Payer: Self-pay | Admitting: Internal Medicine

## 2019-03-12 ENCOUNTER — Other Ambulatory Visit: Payer: Self-pay

## 2019-03-12 ENCOUNTER — Ambulatory Visit: Payer: Medicaid Other | Admitting: Internal Medicine

## 2019-03-12 VITALS — BP 117/80 | HR 68 | Temp 97.8°F | Ht 70.0 in | Wt 379.4 lb

## 2019-03-12 DIAGNOSIS — R509 Fever, unspecified: Secondary | ICD-10-CM | POA: Diagnosis not present

## 2019-03-12 DIAGNOSIS — R079 Chest pain, unspecified: Secondary | ICD-10-CM

## 2019-03-12 DIAGNOSIS — Z6841 Body Mass Index (BMI) 40.0 and over, adult: Secondary | ICD-10-CM

## 2019-03-12 DIAGNOSIS — R634 Abnormal weight loss: Secondary | ICD-10-CM

## 2019-03-12 DIAGNOSIS — D225 Melanocytic nevi of trunk: Secondary | ICD-10-CM

## 2019-03-12 DIAGNOSIS — D229 Melanocytic nevi, unspecified: Secondary | ICD-10-CM | POA: Insufficient documentation

## 2019-03-12 NOTE — Patient Instructions (Signed)
Reginald Campbell, It was nice meeting you! Today we discussed the areas on your skin. I am referring you to a dermatologist to have the top mole removed and rule out any more serious findings for the bottom mole since it has changed fairly quickly over the last 2 months.   You will be called to schedule an appointment once the referral is processed.   Take care,  Dr. Koleen Distance

## 2019-03-12 NOTE — Assessment & Plan Note (Signed)
Patient presents for 2 moles. The one on his chest appears benign, has been present over 8 years and remained unchanged. Mostly irritates him getting hung on clothes and would like it removed.   The second is more concerning, as it appeared only 2 months ago and has grown from size of a pin head to an eraser head, has irregular borders, and black crusted appearance.   - placing referral to dermatology for shave biopsy and further pathologic evaluation to rule out malignant etiologies

## 2019-03-12 NOTE — Progress Notes (Signed)
   CC: moles   HPI:  [name] is a 43 y.o. gentleman with PMHx listed below who presents for further evaluation of 2 moles.  One mole has been present for close to 10 years, is located in the center of his chest just underneath pectoral region. This mole has not changed in size. The area is not painful, but is occasionally bothersome when it gets caught on his clothing.  The other mole he noticed a couple of months ago in his periumbilical region. He endorses associated pruritis and mild pain. The area started out the size of a pen head and has progressively grown to the size of an eraser head. He denies history of similar areas anywhere else on his skin. No history of prolonged sun exposure.   Past Medical History:  Diagnosis Date  . Arthritis   . Asthma   . Back pain   . BRBPR (bright red blood per rectum) 04/07/2015  . 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 Depression   . Family history of adverse reaction to anesthesia    daughter under anesthesia for 6 hours had problems with N/V   . History of kidney stones   . Hypertension    hx of elevated bp, no meds for htn  . Phimosis   . Rectal bleeding 03/07/2017  . Sleep apnea    does not use cpap due to broke   Review of Systems:  Review of Systems  Constitutional: Positive for chills, fever and weight loss.  Respiratory: Negative for cough and shortness of breath.   Cardiovascular: Positive for chest pain. Negative for leg swelling.  Gastrointestinal: Negative for abdominal pain, constipation, diarrhea, nausea and vomiting.  Genitourinary: Negative for dysuria and hematuria.  Musculoskeletal: Negative for joint pain and myalgias.  Neurological: Negative for sensory change and headaches.    Physical Exam:  Vitals:   03/12/19 0903  BP: 117/80  Pulse: 68  Temp: 97.8 F (36.6 C)  TempSrc: Oral  SpO2: 99%  Weight: (!) 379 lb 6.4 oz (172.1 kg)  Height: 5\' 10"  (1.778 m)   General: alert, morbidly obese gentleman,  well-appearing, in NAD CV: RRR; no m/r/g Pulm: normal work of breathing; lungs CTAB  Ext: no pitting edema Skin: see pictures in media tab. Benign appearing nevus in center of chest beneath breast tissue that is non-tender Nevus around naval with black coloring, irregular borders, raised, appears erythematous underneath.  Psych: appropriate mood and affect   Assessment & Plan:   See Encounters Tab for problem based charting.  Patient discussed with Dr. Rebeca Alert

## 2019-03-16 NOTE — Progress Notes (Signed)
Internal Medicine Clinic Attending  Case discussed with Dr. Bloomfield at the time of the visit.  We reviewed the resident's history and exam and pertinent patient test results.  I agree with the assessment, diagnosis, and plan of care documented in the resident's note.  Alexander Raines, M.D., Ph.D.  

## 2019-03-20 ENCOUNTER — Other Ambulatory Visit: Payer: Self-pay

## 2019-03-20 ENCOUNTER — Emergency Department (HOSPITAL_COMMUNITY)
Admission: EM | Admit: 2019-03-20 | Discharge: 2019-03-20 | Disposition: A | Discharge status: Home / Self Care | Payer: Medicaid Other | Attending: Emergency Medicine | Admitting: Emergency Medicine

## 2019-03-20 ENCOUNTER — Encounter (HOSPITAL_COMMUNITY): Payer: Self-pay | Admitting: Emergency Medicine

## 2019-03-20 DIAGNOSIS — J45909 Unspecified asthma, uncomplicated: Secondary | ICD-10-CM | POA: Diagnosis not present

## 2019-03-20 DIAGNOSIS — R319 Hematuria, unspecified: Secondary | ICD-10-CM | POA: Diagnosis present

## 2019-03-20 DIAGNOSIS — I10 Essential (primary) hypertension: Secondary | ICD-10-CM | POA: Diagnosis not present

## 2019-03-20 DIAGNOSIS — N3001 Acute cystitis with hematuria: Secondary | ICD-10-CM | POA: Insufficient documentation

## 2019-03-20 DIAGNOSIS — Z87891 Personal history of nicotine dependence: Secondary | ICD-10-CM | POA: Diagnosis not present

## 2019-03-20 LAB — URINALYSIS, ROUTINE W REFLEX MICROSCOPIC
Bilirubin Urine: NEGATIVE
Glucose, UA: NEGATIVE mg/dL
Ketones, ur: NEGATIVE mg/dL
Nitrite: NEGATIVE
Protein, ur: NEGATIVE mg/dL
RBC / HPF: >50 RBC/hpf — ABNORMAL HIGH (ref 0–5)
Specific Gravity, Urine: 1.019 (ref 1.005–1.030)
pH: 7 (ref 5.0–8.0)

## 2019-03-20 LAB — BASIC METABOLIC PANEL
Anion gap: 8 (ref 5–15)
BUN: 14 mg/dL (ref 6–20)
CO2: 27 mmol/L (ref 22–32)
Calcium: 8.8 mg/dL — ABNORMAL LOW (ref 8.9–10.3)
Chloride: 104 mmol/L (ref 98–111)
Creatinine, Ser: 0.92 mg/dL (ref 0.61–1.24)
GFR calc Af Amer: >60 mL/min (ref >60)
GFR calc non Af Amer: >60 mL/min (ref >60)
Glucose, Bld: 108 mg/dL — ABNORMAL HIGH (ref 70–99)
Potassium: 4.1 mmol/L (ref 3.5–5.1)
Sodium: 139 mmol/L (ref 135–145)

## 2019-03-20 LAB — CBC WITH DIFFERENTIAL/PLATELET
Abs Immature Granulocytes: 0.02 10*3/uL (ref 0.00–0.07)
Basophils Absolute: 0 10*3/uL (ref 0.0–0.1)
Basophils Relative: 0 %
Eosinophils Absolute: 0.1 10*3/uL (ref 0.0–0.5)
Eosinophils Relative: 1 %
HCT: 42.6 % (ref 39.0–52.0)
Hemoglobin: 13.9 g/dL (ref 13.0–17.0)
Immature Granulocytes: 0 %
Lymphocytes Relative: 13 %
Lymphs Abs: 1.2 10*3/uL (ref 0.7–4.0)
MCH: 29.4 pg (ref 26.0–34.0)
MCHC: 32.6 g/dL (ref 30.0–36.0)
MCV: 90.1 fL (ref 80.0–100.0)
Monocytes Absolute: 0.6 10*3/uL (ref 0.1–1.0)
Monocytes Relative: 7 %
Neutro Abs: 6.8 10*3/uL (ref 1.7–7.7)
Neutrophils Relative %: 79 %
Platelets: 158 10*3/uL (ref 150–400)
RBC: 4.73 MIL/uL (ref 4.22–5.81)
RDW: 13.6 % (ref 11.5–15.5)
WBC: 8.7 10*3/uL (ref 4.0–10.5)
nRBC: 0 % (ref 0.0–0.2)

## 2019-03-20 MED ORDER — SULFAMETHOXAZOLE-TRIMETHOPRIM 800-160 MG PO TABS
1.0000 | ORAL_TABLET | Freq: Once | ORAL | Status: AC
  Administered 2019-03-20: 1 via ORAL
  Filled 2019-03-20: qty 1

## 2019-03-20 MED ORDER — SULFAMETHOXAZOLE-TRIMETHOPRIM 800-160 MG PO TABS: 1.0000 | ORAL_TABLET | Freq: Two times a day (BID) | ORAL | 0 refills | Status: AC

## 2019-03-20 NOTE — ED Triage Notes (Signed)
Pt states around 1600 today he went to go pee and his urine appeared to be bloody. Pt denies trouble urinating. Pt endorses back pain.

## 2019-03-20 NOTE — ED Provider Notes (Signed)
[facility] EMERGENCY DEPARTMENT Provider Note   CSN: [id number] Arrival date & time: 03/20/19  1759     History   Chief Complaint Chief Complaint  Patient presents with  . Hematuria    HPI Reginald Campbell is a 43 y.o. male.     43 year old male with past medical history of chronic back pain and kidney stones presents with complaint of hematuria.  Patient states that he went to the bathroom earlier and voided only blood.  Patient reports later going to the bathroom and voiding a large clot.  Patient has not had difficulty urinating, does report some dysuria and frequency.  Denies any new back pain or pain similar to previous kidney stones.  Patient states he was last at his urologist a month ago and had a CT scan that did not show any renal stones.  Denies any associated fever, chills, nausea, vomiting, changes in bowel habits or abdominal pain.  Patient is not on blood thinners.  No other complaints or concerns.     Past Medical History:  Diagnosis Date  . Arthritis   . Asthma   . Back pain   . BRBPR (bright red blood per rectum) 04/07/2015  . 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 Depression   . Family history of adverse reaction to anesthesia    daughter under anesthesia for 6 hours had problems with N/V   . History of kidney stones   . Hypertension    hx of elevated bp, no meds for htn  . Phimosis   . Rectal bleeding 03/07/2017  . Sleep apnea    does not use cpap due to broke    Patient Active Problem List   Diagnosis Date Noted  . Atypical mole 03/12/2019  . HTN (hypertension) 10/22/2018  . Chronic bilateral low back pain with bilateral sciatica 04/09/2018  . Internal hemorrhoids 03/08/2017  . Morbid obesity (Hydro) 10/02/2016  . Lumbar radiculopathy, chronic 10/01/2016  . MDD (major depressive disorder) 10/01/2016  . Bilateral shoulder pain 08/18/2015  . Nephrolithiasis 04/18/2015  . Healthcare maintenance 04/07/2015  . Family history of  diabetes mellitus 04/07/2015  . Obstructive sleep apnea 04/07/2015    Past Surgical History:  Procedure Laterality Date  . CIRCUMCISION N/A 03/27/2017   Procedure: CIRCUMCISION ADULT with canal block;  Surgeon: Alexis Frock, MD;  Location: [facility];  Service: Urology;  Laterality: N/A;  . CIRCUMCISION REVISION N/A 10/04/2017   Procedure: CIRCUMCISION REVISION;  Surgeon: Alexis Frock, MD;  Location: [facility];  Service: Urology;  Laterality: N/A;  . NO PAST SURGERIES          Home Medications    Prior to Admission medications   Not on File    Family History Family History  Problem Relation Age of Onset  . Heart disease Mother   . Diabetes Father   . Hypertension Father   . Cirrhosis Father   . Heart disease Maternal Grandfather   . Melanoma Paternal Grandmother   . Lung cancer Paternal Uncle   . Lung cancer Paternal Aunt   . Asthma Daughter   . Gallbladder disease Daughter   . Erythema nodosum Daughter     Social History Social History   Tobacco Use  . Smoking status: Former Smoker    Packs/day: 1.00    Years: 1.00    Pack years: 1.00    Quit date: 07/30/1994    Years since quitting: 24.6  . Smokeless tobacco: Never Used  Substance Use Topics  .  Alcohol use: No    Alcohol/week: 0.0 standard drinks  . Drug use: No     Allergies   Penicillins   Review of Systems Review of Systems  Constitutional: Negative for fever.  Gastrointestinal: Negative for abdominal pain, constipation, diarrhea, nausea and vomiting.  Genitourinary: Positive for frequency, hematuria and urgency. Negative for difficulty urinating.  Musculoskeletal: Negative for back pain.  Skin: Negative for rash and wound.  Allergic/Immunologic: Negative for immunocompromised state.  Neurological: Negative for dizziness and weakness.  Hematological: Does not bruise/bleed easily.  Psychiatric/Behavioral: Negative for confusion.  All other systems reviewed and are negative.    Physical Exam  Updated Vital Signs BP (!) 149/99 (BP Location: Right Arm)   Pulse 80   Temp 98.1 F (36.7 C) (Oral)   Resp 16   Ht 5\' 10"  (1.778 m)   Wt (!) 165.1 kg   SpO2 97%   BMI 52.23 kg/m   Physical Exam Vitals signs and nursing note reviewed.  Constitutional:      General: He is not in acute distress.    Appearance: He is well-developed. He is obese. He is not diaphoretic.  HENT:     Head: Normocephalic and atraumatic.  Cardiovascular:     Rate and Rhythm: Normal rate and regular rhythm.     Pulses: Normal pulses.     Heart sounds: Normal heart sounds.  Pulmonary:     Effort: Pulmonary effort is normal.     Breath sounds: Normal breath sounds.  Abdominal:     Palpations: Abdomen is soft.     Tenderness: There is no abdominal tenderness. There is no right CVA tenderness or left CVA tenderness.  Musculoskeletal:        General: No tenderness.  Skin:    General: Skin is warm and dry.     Findings: No erythema or rash.  Neurological:     Mental Status: He is alert and oriented to person, place, and time.  Psychiatric:        Behavior: Behavior normal.      ED Treatments / Results  Labs (all labs ordered are listed, but only abnormal results are displayed) Labs Reviewed  URINALYSIS, ROUTINE W REFLEX MICROSCOPIC - Abnormal; Notable for the following components:      Result Value   Hgb urine dipstick LARGE (*)    Leukocytes,Ua TRACE (*)    RBC / HPF >50 (*)    Bacteria, UA RARE (*)    All other components within normal limits  BASIC METABOLIC PANEL  CBC WITH DIFFERENTIAL/PLATELET    EKG None  Radiology No results found.  Procedures Procedures (including critical care time)  Medications Ordered in ED Medications - No data to display   Initial Impression / Assessment and Plan / ED Course  I have reviewed the triage vital signs and the nursing notes.  Pertinent labs & imaging results that were available during my care of the patient were reviewed by me and  considered in my medical decision making (see chart for details).  Clinical Course as of Mar 19 2114  Fri Mar 19, 7560  4040 43 year old male with complaint of gross hematuria onset today without flank pain or abdominal pain or symptoms to suggest kidney stone.  Patient does have some dysuria with this.  Urinalysis positive for large hemoglobin, trace leukocytes greater than 50 red cells, 11-20 white cells and rare bacteria.  Patient be started on Septra, culture sent.  CBC and BMP are unremarkable.  Patient will need  follow-up in 1 week after his antibiotics are complete, recommend either through urology or with PCP, advised to return to ER for new or worsening symptoms.   [LM]    Clinical Course User Index [LM] Tacy Learn, PA-C       Final Clinical Impressions(s) / ED Diagnoses   Final diagnoses:  None    ED Discharge Orders    None       Roque Lias 03/20/19 2115    Noemi Chapel, MD 03/21/19 [phone]

## 2019-03-20 NOTE — ED Notes (Signed)
Discharge instructions discussed with pt. Pt. verbalized understanding and no questions at this time

## 2019-03-20 NOTE — Discharge Instructions (Addendum)
Take Septra as prescribed and complete the full course. Follow-up with primary care provider or see urology if problem persists, recommend urine recheck in 1 week after completing antibiotics.  Return to ER for new or worsening symptoms.

## 2019-03-22 LAB — URINE CULTURE: Culture: 70000 — AB

## 2019-03-23 ENCOUNTER — Telehealth: Payer: Self-pay | Admitting: *Deleted

## 2019-03-23 DIAGNOSIS — D1801 Hemangioma of skin and subcutaneous tissue: Secondary | ICD-10-CM | POA: Diagnosis not present

## 2019-03-23 NOTE — Telephone Encounter (Signed)
Post ED Visit - Positive Culture Follow-up: Successful Patient Follow-Up  Culture assessed and recommendations reviewed by:  []  Elenor Quinones, Pharm.D. []  Heide Guile, Pharm.D., BCPS AQ-ID []  Parks Neptune, Pharm.D., BCPS []  Alycia Rossetti, Pharm.D., BCPS []  [place], Pharm.D., BCPS, AAHIVP []  Legrand Como, Pharm.D., BCPS, AAHIVP []  Salome Arnt, PharmD, BCPS []  Johnnette Gourd, PharmD, BCPS []  Hughes Better, PharmD, BCPS []  Leeroy Cha, PharmD  Positive urine culture  []  Patient discharged without antimicrobial prescription and treatment is now indicated [x]  Organism is resistant to prescribed ED discharge antimicrobial []  Patient with positive blood cultures  Changes discussed with ED provider Okey Regal, PA-C New antibiotic prescription Levofloxacin 750mg  QD x 5 days Called to [facility] [phone]  Contacted patient, date 03/23/2019, time [place], [place] 03/23/2019, 10:22 AM

## 2019-03-26 DIAGNOSIS — D1801 Hemangioma of skin and subcutaneous tissue: Secondary | ICD-10-CM | POA: Diagnosis not present

## 2019-04-03 DIAGNOSIS — D1801 Hemangioma of skin and subcutaneous tissue: Secondary | ICD-10-CM | POA: Diagnosis not present

## 2019-06-15 ENCOUNTER — Encounter: Payer: Self-pay | Admitting: Internal Medicine

## 2019-06-15 ENCOUNTER — Ambulatory Visit: Payer: Medicaid Other | Admitting: Internal Medicine

## 2019-06-15 ENCOUNTER — Ambulatory Visit (HOSPITAL_COMMUNITY)
Admission: RE | Admit: 2019-06-15 | Discharge: 2019-06-15 | Disposition: A | Discharge status: Home / Self Care | Payer: Medicaid Other | Source: Ambulatory Visit | Attending: Oncology | Admitting: Oncology

## 2019-06-15 ENCOUNTER — Other Ambulatory Visit: Payer: Self-pay

## 2019-06-15 DIAGNOSIS — M5441 Lumbago with sciatica, right side: Secondary | ICD-10-CM

## 2019-06-15 DIAGNOSIS — G4733 Obstructive sleep apnea (adult) (pediatric): Secondary | ICD-10-CM

## 2019-06-15 DIAGNOSIS — G8921 Chronic pain due to trauma: Secondary | ICD-10-CM | POA: Diagnosis not present

## 2019-06-15 DIAGNOSIS — M546 Pain in thoracic spine: Secondary | ICD-10-CM | POA: Diagnosis not present

## 2019-06-15 DIAGNOSIS — G8929 Other chronic pain: Secondary | ICD-10-CM

## 2019-06-15 DIAGNOSIS — M5442 Lumbago with sciatica, left side: Secondary | ICD-10-CM

## 2019-06-15 DIAGNOSIS — I10 Essential (primary) hypertension: Secondary | ICD-10-CM | POA: Diagnosis not present

## 2019-06-15 DIAGNOSIS — M545 Low back pain: Secondary | ICD-10-CM | POA: Diagnosis not present
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
## 2019-06-15 NOTE — Progress Notes (Signed)
   CC: Evaluation for back pain and follow-up for hypertension sleep apnea  HPI:[name] is a 43 y.o. male who presents for evaluation of chronic persistent back pain, history of OSA and hypertension. Please see individual problem based A/P for details.  Influenza vaccination: Obtained at [facility] September 29th per patient.  Past Medical History:  Diagnosis Date  . Arthritis   . Asthma   . Back pain   . BRBPR (bright red blood per rectum) 04/07/2015  . 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 Depression   . Family history of adverse reaction to anesthesia    daughter under anesthesia for 6 hours had problems with N/V   . History of kidney stones   . Hypertension    hx of elevated bp, no meds for htn  . Phimosis   . Rectal bleeding 03/07/2017  . Sleep apnea    does not use cpap due to broke   Review of Systems:  ROS negative except as per HPI.  Physical Exam: Vitals:   06/15/19 1401  BP: 119/85  Pulse: 76  Temp: 98 F (36.7 C)  TempSrc: Oral  SpO2: 99%  Weight: (!) 388 lb 4.8 oz (176.1 kg)  Height: 5\' 10"  (1.778 m)   General: A/O x4, in no acute distress, afebrile, nondiaphoretic HEENT: PEERL, EMO intact Cardio: RRR, no mrg's  Pulmonary: CTA bilaterally,  no wheezing or crackles  Abdomen: Bowel sounds normal, soft, nontender  MSK: BLE nontender, nonedematous, tenderness to palpation of the left lower back/spine, in the L2 nerve distribution.  Psych: Appropriate affect, not depressed in appearance, engages well  Assessment & Plan:   See Encounters Tab for problem based charting.  Patient discussed with Dr. Dareen Piano

## 2019-06-15 NOTE — Assessment & Plan Note (Signed)
  Hypertension: Patient's BP today is 119/85 with a goal of <140/80. He is currently not on medical therapy for this. He denied, chest pain, headache, visual changes, lightheadedness, weakness, dizziness on standing, swelling in the feet or ankles.   Plan: Continue to monitor each visit

## 2019-06-15 NOTE — Assessment & Plan Note (Signed)
  OSA: Currently untreated.  Patient states he continues to live with his parents and there is an adequate electrical supply to run an additional electrical appliance.  As such, he does not wish to pursue a CPAP.  Risks of untreated OSA discussed with the patient and benefits of such also discussed.  Plan: Continue to encourage pursuing sleep apnea testing

## 2019-06-15 NOTE — Patient Instructions (Signed)
FOLLOW-UP INSTRUCTIONS When: 3-4 months For: Routine visit What to bring: All of your medications   I have not made any changes to your medications. Please continue to work on a way to obtain a power source for a sleep apnea machine. This is the best way to prevent the long term harm that will result from untreated sleep apnea.  I have ordered xrays of your back, please get these done. If they do not show anything we will proceed with an MRI.  Thank you for your visit to the [facility] today. If you have any questions or concerns please call us at [phone].

## 2019-06-16 ENCOUNTER — Telehealth: Payer: Self-pay | Admitting: Internal Medicine

## 2019-06-16 DIAGNOSIS — G8929 Other chronic pain: Secondary | ICD-10-CM

## 2019-06-16 DIAGNOSIS — M5441 Lumbago with sciatica, right side: Secondary | ICD-10-CM

## 2019-06-16 NOTE — Telephone Encounter (Signed)
Notified patient of his xray results. Given the minimal findings noted on the xrays supported the findings from the CT scan. He will need a lumbar MRI to better evaluate his spinal pathology and pain.   MRI lumbar spine order placed.

## 2019-06-17 NOTE — Progress Notes (Signed)
Internal Medicine Clinic Attending  Case discussed with Dr. Harbrecht at the time of the visit.  We reviewed the resident's history and exam and pertinent patient test results.  I agree with the assessment, diagnosis, and plan of care documented in the resident's note.   

## 2019-10-02 DIAGNOSIS — Z03818 Encounter for observation for suspected exposure to other biological agents ruled out: Secondary | ICD-10-CM | POA: Diagnosis not present

## 2019-10-02 DIAGNOSIS — Z20822 Contact with and (suspected) exposure to covid-19: Secondary | ICD-10-CM | POA: Diagnosis not present

## 2019-11-30 ENCOUNTER — Encounter: Payer: Self-pay | Admitting: *Deleted

## 2020-03-22 ENCOUNTER — Other Ambulatory Visit: Payer: Self-pay

## 2020-03-22 ENCOUNTER — Encounter: Payer: Self-pay | Admitting: Student

## 2020-03-22 ENCOUNTER — Ambulatory Visit: Payer: Medicaid Other | Admitting: Student

## 2020-03-22 VITALS — BP 127/87 | HR 81 | Temp 98.2°F | Ht 70.0 in | Wt 385.4 lb

## 2020-03-22 DIAGNOSIS — Z0000 Encounter for general adult medical examination without abnormal findings: Secondary | ICD-10-CM

## 2020-03-22 DIAGNOSIS — M5441 Lumbago with sciatica, right side: Secondary | ICD-10-CM

## 2020-03-22 DIAGNOSIS — Z1159 Encounter for screening for other viral diseases: Secondary | ICD-10-CM

## 2020-03-22 DIAGNOSIS — I10 Essential (primary) hypertension: Secondary | ICD-10-CM | POA: Diagnosis not present

## 2020-03-22 DIAGNOSIS — G4733 Obstructive sleep apnea (adult) (pediatric): Secondary | ICD-10-CM | POA: Diagnosis not present

## 2020-03-22 DIAGNOSIS — R739 Hyperglycemia, unspecified: Secondary | ICD-10-CM | POA: Diagnosis not present

## 2020-03-22 DIAGNOSIS — Z114 Encounter for screening for human immunodeficiency virus [HIV]: Secondary | ICD-10-CM | POA: Diagnosis not present

## 2020-03-22 DIAGNOSIS — M5416 Radiculopathy, lumbar region: Secondary | ICD-10-CM | POA: Diagnosis not present

## 2020-03-22 DIAGNOSIS — G8929 Other chronic pain: Secondary | ICD-10-CM

## 2020-03-22 DIAGNOSIS — M5442 Lumbago with sciatica, left side: Secondary | ICD-10-CM

## 2020-03-22 DIAGNOSIS — Z833 Family history of diabetes mellitus: Secondary | ICD-10-CM

## 2020-03-22 MED ORDER — DULOXETINE HCL 30 MG PO CPEP: 30.0000 mg | ORAL_CAPSULE | Freq: Every day | ORAL | 2 refills | Status: DC

## 2020-03-22 MED ORDER — GABAPENTIN 100 MG PO CAPS: 100.0000 mg | ORAL_CAPSULE | Freq: Every day | ORAL | 2 refills | Status: DC

## 2020-03-22 NOTE — Assessment & Plan Note (Signed)
Patients BP today 127/87 and he is not on any medications. Unsure if patient truly has essential hypertension. Patient does have a history of untreated OSA so will continue monitoring BP.

## 2020-03-22 NOTE — Assessment & Plan Note (Signed)
Patient uninterested in [place] vaccine. States that if he gets it, he gets it but he is not going to get the vaccine. Educated patient on the benefits of receiving the vaccine however patient uninterested.  Patient did agree to screening for HIV and Hep C.  Plan: -Hep C and HIV screening performed -Discuss receiving COVID vaccine at next visit

## 2020-03-22 NOTE — Assessment & Plan Note (Signed)
Patient with history of chronic lumbar radiculopathy. Prior plain films negative for compression fracture and ankylosing spondylitis. Patient reports worsening low back pain. Reports that sometimes he will have pain referred to knees and sometimes pain will shoot down both legs all the way to toes. He reports that he has tried NSAIDs with no significant pain relief. Reports that he was released from work due to inability to perform physical tasks. He is currently applying for disability however he has not been accepted because of lack of more information regarding chronic low back pain. Reports that back pain is significantly affecting his ADLs as patient cannot stand for long periods of time nor sit for long periods of time which prevents him from moving around his residence and hinders him from driving as well. Patient also has coexisting morbid obesity, but is not able to exercise due to limitation from back pain.   We discussed medical management of pain. Educated him that medical management will not completely resolve the pain, but that it should help him function from day to day. Discussed starting duloxetine 30mg  daily for radicular pain and discussed starting gabapentin 100mg  qhs for his intermittent neuropathic pain. Also discussed with patient that he can take NSAIDs on his bad days when he is having more pain, but advised him not to take them daily. Patient agrees with plan.  He is also interested in more invasive procedures including the possibility of surgical intervention if it will help his back pain resolve and let him perform his ADLs again. At this time, I believe that obtaining an MRI can help guide [place] in terms of whether or not more invasive procedures need to be approached.  Plan: -Start cymbalta 30mg  daily -start gabapentin 100mg  qhs -use NSAIDs as needed but not daily -f/u MRI results -follow up visit in 1 month to reassess

## 2020-03-22 NOTE — Patient Instructions (Addendum)
[name],  It was a pleasure seeing you in the clinic today.  As we discussed, we will have you undergo a home sleep study for sleep apnea in order to get you a CPAP machine to use at bedtime.  For your chronic low back pain with radiation to your legs, we will try to obtain an MRI to evaluate your back pain since you would be interested in more invasive interventions in order to relieve your pain and help you perform your day-to-day activities. At this time, we will start you on low dose duloxetine (30mg  daily) and we will also start you on low dose gabapentin (100mg , take at bedtime). As we discussed, only use NSAIDs on your really bad days but do not take them daily.  Please try to incorporate more exercise as tolerated and a healthier diet into your lifestyle.  Please follow up in 1 month.   Chronic Back Pain When back pain lasts longer than 3 months, it is called chronic back pain. Pain may get worse at certain times (flare-ups). There are things you can do at home to manage your pain. Follow these instructions at home: Activity      Avoid bending and other activities that make pain worse.  When standing: ? Keep your upper back and neck straight. ? Keep your shoulders pulled back. ? Avoid slouching.  When sitting: ? Keep your back straight. ? Relax your shoulders. Do not round your shoulders or pull them backward.  Do not sit or stand in one place for long periods of time.  Take short rest breaks during the day. Lying down or standing is usually better than sitting. Resting can help relieve pain.  When sitting or lying down for a long time, do some mild activity or stretching. This will help to prevent stiffness and pain.  Get regular exercise. Ask your doctor what activities are safe for you.  Do not lift anything that is heavier than 10 lb (4.5 kg). To prevent injury when you lift things: ? Bend your knees. ? Keep the weight close to your body. ? Avoid  twisting. Managing pain  If told, put ice on the painful area. Your doctor may tell you to use ice for 24-48 hours after a flare-up starts. ? Put ice in a plastic bag. ? Place a towel between your skin and the bag. ? Leave the ice on for 20 minutes, 2-3 times a day.  If told, put heat on the painful area as often as told by your doctor. Use the heat source that your doctor recommends, such as a moist heat pack or a heating pad. ? Place a towel between your skin and the heat source. ? Leave the heat on for 20-30 minutes. ? Remove the heat if your skin turns bright red. This is especially important if you are unable to feel pain, heat, or cold. You may have a greater risk of getting burned.  Soak in a warm bath. This can help relieve pain.  Take over-the-counter and prescription medicines only as told by your doctor. General instructions  Sleep on a firm mattress. Try lying on your side with your knees slightly bent. If you lie on your back, put a pillow under your knees.  Keep all follow-up visits as told by your doctor. This is important. Contact a doctor if:  You have pain that does not get better with rest or medicine. Get help right away if:  One or both of your arms or  legs feel weak.  One or both of your arms or legs lose feeling (numbness).  You have trouble controlling when you poop (bowel movement) or pee (urinate).  You feel sick to your stomach (nauseous).  You throw up (vomit).  You have belly (abdominal) pain.  You have shortness of breath.  You pass out (faint). Summary  When back pain lasts longer than 3 months, it is called chronic back pain.  Pain may get worse at certain times (flare-ups).  Use ice and heat as told by your doctor. Your doctor may tell you to use ice after flare-ups. This information is not intended to replace advice given to you by your health care provider. Make sure you discuss any questions you have with your health care  provider. Document Revised: 11/06/2018 Document Reviewed: 02/28/2017 Elsevier Patient Education  2020 [employer].

## 2020-03-22 NOTE — Assessment & Plan Note (Signed)
Patient with strong family history of DM in father, brother, and other family members. He has had several instances of hyperglycemia on blood glucose labs (in 140s). Will obtain HbA1c today to evaluate for diabetes or prediabetes.

## 2020-03-22 NOTE — Progress Notes (Signed)
CC: follow up visit  HPI:  [name] is a 44 y.o. 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 Please see individualized A/P for full HPI.  Past Medical History:  Diagnosis Date  . Arthritis   . Asthma   . Back pain   . BRBPR (bright red blood per rectum) 04/07/2015  . 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 Depression   . Family history of adverse reaction to anesthesia    daughter under anesthesia for 6 hours had problems with N/V   . History of kidney stones   . Hypertension    hx of elevated bp, no meds for htn  . Lumbar radiculopathy, chronic 10/01/2016  . Phimosis   . Rectal bleeding 03/07/2017  . Sleep apnea    does not use cpap due to broke   No current outpatient medications on file prior to visit.   No current facility-administered medications on file prior to visit.   Allergies  Allergen Reactions  . Penicillins Anaphylaxis, Hives, Swelling and Other (See Comments)    Pt was 15 Has patient had a PCN reaction causing immediate rash, facial/tongue/throat swelling, SOB or lightheadedness with hypotension: Yes Has patient had a PCN reaction causing severe rash involving mucus membranes or skin necrosis: No Has patient had a PCN reaction that required hospitalization: Yes Has patient had a PCN reaction occurring within the last 10 years: No If all of the above answers are "NO", then may proceed with Cephalosporin use.    Family History  Problem Relation Age of Onset  . Heart disease Mother   . Diabetes Father   . Hypertension Father   . Cirrhosis Father   . Heart disease Maternal Grandfather   . Melanoma Paternal Grandmother   . Lung cancer Paternal Uncle   . Lung cancer Paternal Aunt   . Asthma Daughter   . Gallbladder disease Daughter   . Erythema nodosum Daughter    Social History   Socioeconomic History  . Marital  status: Divorced    Spouse name: Not on file  . Number of children: 1  . Years of education: Not on file  . Highest education level: Not on file  Occupational History  . Not on file  Tobacco Use  . Smoking status: Former Smoker    Packs/day: 1.00    Years: 1.00    Pack years: 1.00    Quit date: 07/30/1994    Years since quitting: 25.6  . Smokeless tobacco: Never Used  Vaping Use  . Vaping Use: Never used  Substance and Sexual Activity  . Alcohol use: No    Alcohol/week: 0.0 standard drinks  . Drug use: No  . Sexual activity: Yes  Other Topics Concern  . Not on file  Social History Narrative  . Not on file   Social Determinants of Health   Financial Resource Strain:   . Difficulty of Paying Living Expenses: Not on file  Food Insecurity:   . Worried About Charity fundraiser in the Last Year: Not on file  . Ran Out of Food in the Last Year: Not on file  Transportation Needs:   . Lack of Transportation (Medical): Not on file  . Lack of Transportation (Non-Medical): Not on file  Physical Activity:   . Days of Exercise per Week: Not on file  . Minutes of Exercise per  Session: Not on file  Stress:   . Feeling of Stress : Not on file  Social Connections:   . Frequency of Communication with Friends and Family: Not on file  . Frequency of Social Gatherings with Friends and Family: Not on file  . Attends Religious Services: Not on file  . Active Member of Clubs or Organizations: Not on file  . Attends Archivist Meetings: Not on file  . Marital Status: Not on file  Intimate Partner Violence:   . Fear of Current or Ex-Partner: Not on file  . Emotionally Abused: Not on file  . Physically Abused: Not on file  . Sexually Abused: Not on file    Review of Systems:   Please see individualized A/P for full ROS.  Physical Exam:  Vitals:   03/22/20 1452  BP: 127/87  Pulse: 81  Temp: 98.2 F (36.8 C)  TempSrc: Oral  SpO2: 99%  Weight: (!) 385 lb 6.4 oz (174.8  kg)  Height: 5\' 10"  (1.778 m)   Physical Exam Constitutional:      Comments: Morbidly obese.  HENT:     Head: Normocephalic and atraumatic.     Mouth/Throat:     Mouth: Mucous membranes are moist.     Pharynx: Oropharynx is clear.  Eyes:     Extraocular Movements: Extraocular movements intact.     Conjunctiva/sclera: Conjunctivae normal.     Pupils: Pupils are equal, round, and reactive to light.  Cardiovascular:     Rate and Rhythm: Normal rate and regular rhythm.     Heart sounds: Normal heart sounds. No murmur heard.  No friction rub. No gallop.   Pulmonary:     Effort: Pulmonary effort is normal.     Breath sounds: Normal breath sounds. No wheezing, rhonchi or rales.  Abdominal:     General: Abdomen is flat. Bowel sounds are normal. There is no distension.     Palpations: Abdomen is soft.     Tenderness: There is no abdominal tenderness. There is no guarding or rebound.  Musculoskeletal:     Cervical back: Normal range of motion.     Comments: Trace edema in bilateral lower extremities. Pain in left lumbar back/spine with palpation with referred pain down to left knee.  Skin:    General: Skin is warm and dry.  Neurological:     Mental Status: He is alert and oriented to person, place, and time.  Psychiatric:        Mood and Affect: Mood normal.        Behavior: Behavior normal.        Thought Content: Thought content normal.      Assessment & Plan:   See Encounters Tab for problem based charting.  Patient seen with Dr. Evette Doffing

## 2020-03-22 NOTE — Assessment & Plan Note (Signed)
Patient with history of OSA, untreated. Had a sleep study done in 2016 that showed OSA. Patient used a CPAP for some time but it was uncomfortable for him and thus it was taken back because insurance would not continue paying for it. Patient today reporting daytime somnolence and fatigue. Reports that he is interested in obtaining a CPAP machine again to prevent long term sequelae of untreated OSA.   Plan: -ordered home sleep study to reevaluate OSA -will pursue home CPAP machine after sleep study

## 2020-03-23 ENCOUNTER — Telehealth: Payer: Self-pay | Admitting: Student

## 2020-03-23 LAB — HEMOGLOBIN A1C
Est. average glucose Bld gHb Est-mCnc: 111 mg/dL
Hgb A1c MFr Bld: 5.5 % (ref 4.8–5.6)

## 2020-03-23 LAB — HIV ANTIBODY (ROUTINE TESTING W REFLEX): HIV Screen 4th Generation wRfx: NONREACTIVE

## 2020-03-23 LAB — HEPATITIS C ANTIBODY: Hep C Virus Ab: <0.1 s/co ratio (ref 0.0–0.9)

## 2020-03-23 NOTE — Telephone Encounter (Signed)
Unable to reach patient via phone call, but left voicemail. Will call patient again at later time to discuss lab results (negative for Hep C and HIV; HbA1c 5.5).

## 2020-03-23 NOTE — Telephone Encounter (Signed)
Spoke with Reginald Campbell via phone call and updated him of his lab results. No questions at this time.

## 2020-03-23 NOTE — Progress Notes (Signed)
Internal Medicine Clinic Attending  I saw and evaluated the patient.  I personally confirmed the key portions of the history and exam documented by Dr. Jinwala and I reviewed pertinent patient test results.  The assessment, diagnosis, and plan were formulated together and I agree with the documentation in the resident's note.  

## 2020-03-29 DIAGNOSIS — N200 Calculus of kidney: Secondary | ICD-10-CM | POA: Diagnosis not present

## 2020-03-29 DIAGNOSIS — R310 Gross hematuria: Secondary | ICD-10-CM | POA: Diagnosis not present

## 2020-03-29 DIAGNOSIS — N4883 Acquired buried penis: Secondary | ICD-10-CM | POA: Diagnosis not present

## 2020-04-21 ENCOUNTER — Other Ambulatory Visit: Payer: Self-pay

## 2020-04-21 ENCOUNTER — Encounter: Payer: Self-pay | Admitting: Internal Medicine

## 2020-04-21 ENCOUNTER — Ambulatory Visit: Payer: Medicaid Other | Admitting: Internal Medicine

## 2020-04-21 DIAGNOSIS — M5441 Lumbago with sciatica, right side: Secondary | ICD-10-CM

## 2020-04-21 DIAGNOSIS — G8929 Other chronic pain: Secondary | ICD-10-CM | POA: Diagnosis not present

## 2020-04-21 DIAGNOSIS — M5442 Lumbago with sciatica, left side: Secondary | ICD-10-CM

## 2020-04-21 MED ORDER — GABAPENTIN 300 MG PO CAPS: 300.0000 mg | ORAL_CAPSULE | Freq: Three times a day (TID) | ORAL | 1 refills | Status: DC | PRN

## 2020-04-21 MED ORDER — CYCLOBENZAPRINE HCL 5 MG PO TABS: 5.0000 mg | ORAL_TABLET | Freq: Three times a day (TID) | ORAL | 0 refills | Status: DC | PRN

## 2020-04-21 NOTE — Assessment & Plan Note (Addendum)
Patient with Hx of chronic lumbar radiculopathy 2/2 to a work related incident in 2017-2018 presents to the clinic for follow up on his back pain. Patient states that his gabapentin 100 mg daily and Cymbalta have not alleviated his pain. In the past he has tried icy hot, patches, tens units, PT, NSAIDS to little effect. He does stretch but states that if he stands for long periods of time his back hurts. He is compliant with his current regimen. No alarm features expressed  Physical examination reveals diffuse paraspinal tenderness in the lower lumbar region. On straight leg test he voiced complaints, but reaction may be due to poor conditioning and body habitus. Will increase his gabapentin and add flexeril. Patient was instructed on side effect profile of these medications. He was unable to get his MRI 2/2 to insurance not covering the imaging. - Flexeril 5 mg three times daily PRN - gabapentin 300 mg three times daily as needed - Continue stretching

## 2020-04-21 NOTE — Patient Instructions (Signed)
To Mr. Witherington,  It was a pleasure meeting you today. Today we discussed your back pain. We will increase your gabapentin to 300 mg three times daily as needed and will start you on a muscle relaxer called Flexeril. Both medications can be sedating, so please be careful and cut back on your dose if needed. Have a good day!  Sincerely,  Maudie Mercury, MD

## 2020-04-21 NOTE — Progress Notes (Signed)
   CC: Back Pain   HPI:  Reginald Campbell is a 44 y.o. male, with a PMH noted below, who presents to the clinic for follow up on his back pain. To see the management of his acute and chronic conditions, please see the A&P note under encounters tab.   Past Medical History:  Diagnosis Date  . Arthritis   . Asthma   . Back pain   . BRBPR (bright red blood per rectum) 04/07/2015  . 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 Depression   . Family history of adverse reaction to anesthesia    daughter under anesthesia for 6 hours had problems with N/V   . History of kidney stones   . Hypertension    hx of elevated bp, no meds for htn  . Lumbar radiculopathy, chronic 10/01/2016  . Phimosis   . Rectal bleeding 03/07/2017  . Sleep apnea    does not use cpap due to broke   Review of Systems:   Review of Systems  Constitutional: Negative for diaphoresis, malaise/fatigue and weight loss.  Cardiovascular: Negative for chest pain and palpitations.  Gastrointestinal: Negative for abdominal pain, constipation, diarrhea, nausea and vomiting.  Musculoskeletal: Positive for back pain.  Neurological: Negative for headaches.    Physical Exam:  Vitals:   04/21/20 1001 04/21/20 1033  BP: (!) 137/102 116/77  Pulse: 73 74  Temp: 98.2 F (36.8 C)   TempSrc: Oral   SpO2: 97%   Weight: (!) 389 lb 9.6 oz (176.7 kg)    Physical Exam Constitutional:      General: He is not in acute distress.    Appearance: He is obese. He is not toxic-appearing or diaphoretic.  Cardiovascular:     Rate and Rhythm: Normal rate and regular rhythm.     Pulses: Normal pulses.     Heart sounds: Normal heart sounds. No murmur heard.  No friction rub. No gallop.   Pulmonary:     Effort: Pulmonary effort is normal.     Breath sounds: Normal breath sounds. No stridor. No wheezing or rhonchi.  Musculoskeletal:     Comments: Paraspinal muscle tenderness to palpation of the lumbar region. Straight leg positive bilaterally    Skin:    General: Skin is warm and dry.     Findings: No bruising, erythema or lesion.  Neurological:     Mental Status: He is alert and oriented to person, place, and time.     Assessment & Plan:   See Encounters Tab for problem based charting.  Patient discussed with Dr. Dareen Piano

## 2020-04-22 NOTE — Progress Notes (Signed)
Internal Medicine Clinic Attending ? ?Case discussed with Dr. Winters  At the time of the visit.  We reviewed the resident?s history and exam and pertinent patient test results.  I agree with the assessment, diagnosis, and plan of care documented in the resident?s note.  ?

## 2020-05-19 ENCOUNTER — Other Ambulatory Visit: Payer: Self-pay

## 2020-05-19 ENCOUNTER — Ambulatory Visit: Payer: Medicaid Other | Admitting: Internal Medicine

## 2020-05-19 ENCOUNTER — Encounter: Payer: Self-pay | Admitting: Internal Medicine

## 2020-05-19 VITALS — BP 123/83 | HR 81 | Temp 98.4°F | Ht 70.0 in | Wt 391.8 lb

## 2020-05-19 DIAGNOSIS — M7661 Achilles tendinitis, right leg: Secondary | ICD-10-CM

## 2020-05-19 DIAGNOSIS — M7662 Achilles tendinitis, left leg: Secondary | ICD-10-CM | POA: Diagnosis not present

## 2020-05-19 MED ORDER — MELOXICAM 7.5 MG PO TABS: 7.5000 mg | ORAL_TABLET | Freq: Every day | ORAL | 0 refills | Status: AC

## 2020-05-19 NOTE — Patient Instructions (Signed)
Thank you, [name] for allowing [place] to provide your care today. Today we discussed weight loss and foot pain.    I have ordered the following labs for you:  Lab Orders  No laboratory test(s) ordered today     Tests ordered today:  none  Referrals ordered today:    Referral Orders     Amb Ref to Medical Weight Management     Ambulatory referral to Podiatry   I have ordered the following medication/changed the following medications:   Stop the following medications: There are no discontinued medications.   Start the following medications: Meds ordered this encounter  Medications   meloxicam (MOBIC) 7.5 MG tablet    Sig: Take 1 tablet (7.5 mg total) by mouth daily for 7 days.    Dispense:  7 tablet    Refill:  0     Follow up: with your PCP in 1-3 months    Remember: Wrap your foot and follow up with your medical providers.   Should you have any questions or concerns please call the internal medicine clinic at [phone].     Marianna Payment, D.O. [facility]

## 2020-05-19 NOTE — Progress Notes (Signed)
CC: Achilles tendonitis  HPI:  [name] is a 44 y.o. male with a past medical history stated below and presents today for achilles tendonitis. Please see problem based assessment and plan for additional details.  Past Medical History:  Diagnosis Date  . Arthritis   . Asthma   . Back pain   . BRBPR (bright red blood per rectum) 04/07/2015  . 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 Depression   . Family history of adverse reaction to anesthesia    daughter under anesthesia for 6 hours had problems with N/V   . Family history of diabetes mellitus 04/07/2015  . History of kidney stones   . Hypertension    hx of elevated bp, no meds for htn  . Lumbar radiculopathy, chronic 10/01/2016  . Nephrolithiasis 04/18/2015  . Phimosis   . Rectal bleeding 03/07/2017  . Sleep apnea    does not use cpap due to broke    Current Outpatient Medications on File Prior to Visit  Medication Sig Dispense Refill  . cyclobenzaprine (FLEXERIL) 5 MG tablet Take 1 tablet (5 mg total) by mouth 3 (three) times daily as needed for muscle spasms. 30 tablet 0  . DULoxetine (CYMBALTA) 30 MG capsule Take 1 capsule (30 mg total) by mouth daily. 30 capsule 2  . gabapentin (NEURONTIN) 300 MG capsule Take 1 capsule (300 mg total) by mouth 3 (three) times daily as needed. 90 capsule 1   No current facility-administered medications on file prior to visit.    Family History  Problem Relation Age of Onset  . Heart disease Mother   . Diabetes Father   . Hypertension Father   . Cirrhosis Father   . Heart disease Maternal Grandfather   . Melanoma Paternal Grandmother   . Lung cancer Paternal Uncle   . Lung cancer Paternal Aunt   . Asthma Daughter   . Gallbladder disease Daughter   . Erythema nodosum Daughter     Social History   Socioeconomic History  . Marital status: Divorced    Spouse name: Not on file  . Number of children: 1  . Years of education: Not on file  . Highest education level: Not on  file  Occupational History  . Not on file  Tobacco Use  . Smoking status: Former Smoker    Packs/day: 1.00    Years: 1.00    Pack years: 1.00    Quit date: 07/30/1994    Years since quitting: 25.8  . Smokeless tobacco: Never Used  Vaping Use  . Vaping Use: Never used  Substance and Sexual Activity  . Alcohol use: No    Alcohol/week: 0.0 standard drinks  . Drug use: No  . Sexual activity: Yes  Other Topics Concern  . Not on file  Social History Narrative  . Not on file   Social Determinants of Health   Financial Resource Strain:   . Difficulty of Paying Living Expenses: Not on file  Food Insecurity:   . Worried About Charity fundraiser in the Last Year: Not on file  . Ran Out of Food in the Last Year: Not on file  Transportation Needs:   . Lack of Transportation (Medical): Not on file  . Lack of Transportation (Non-Medical): Not on file  Physical Activity:   . Days of Exercise per Week: Not on file  . Minutes of Exercise per Session: Not on file  Stress:   . Feeling of Stress : Not on file  Social  Connections:   . Frequency of Communication with Friends and Family: Not on file  . Frequency of Social Gatherings with Friends and Family: Not on file  . Attends Religious Services: Not on file  . Active Member of Clubs or Organizations: Not on file  . Attends Archivist Meetings: Not on file  . Marital Status: Not on file  Intimate Partner Violence:   . Fear of Current or Ex-Partner: Not on file  . Emotionally Abused: Not on file  . Physically Abused: Not on file  . Sexually Abused: Not on file    Review of Systems: ROS negative except for what is noted on the assessment and plan.  Vitals:   05/19/20 1458  BP: 123/83  Pulse: 81  Temp: 98.4 F (36.9 C)  TempSrc: Oral  SpO2: 98%  Weight: (!) 391 lb 12.8 oz (177.7 kg)  Height: 5\' 10"  (1.778 m)     Physical Exam: Physical Exam Constitutional:      Appearance: Normal appearance. He is obese.    HENT:     Head: Normocephalic and atraumatic.  Cardiovascular:     Rate and Rhythm: Normal rate.     Pulses: Normal pulses.     Heart sounds: Normal heart sounds.  Pulmonary:     Effort: Pulmonary effort is normal.     Breath sounds: Normal breath sounds.  Musculoskeletal:        General: Tenderness present. Injury: P at the achilles tendon insertion on the calcaneous bilaterally. Mild swelling, no werythema. 2+ PT pulses bilaterally, intact sensationi. Normal range of motion.     Right lower leg: No edema.     Left lower leg: No edema.  Skin:    General: Skin is warm and dry.  Neurological:     Mental Status: He is alert and oriented to person, place, and time. Mental status is at baseline.  Psychiatric:        Mood and Affect: Mood normal.      Assessment & Plan:   See Encounters Tab for problem based charting.  Patient discussed with Dr. Lars Mage, D.O. [facility], PGY-2 Pager: [phone], Phone: [phone] Date 05/20/2020 Time 7:59 AM

## 2020-05-20 ENCOUNTER — Encounter: Payer: Self-pay | Admitting: Internal Medicine

## 2020-05-20 DIAGNOSIS — M7661 Achilles tendinitis, right leg: Secondary | ICD-10-CM | POA: Insufficient documentation

## 2020-05-20 DIAGNOSIS — M7662 Achilles tendinitis, left leg: Secondary | ICD-10-CM | POA: Insufficient documentation

## 2020-05-20 NOTE — Assessment & Plan Note (Signed)
Patient presents with pain at the base of his heel with mild edema and no erythema at the insertion of the Achilles tendon.  There is tenderness to palpation.  Patient states that he has been trying to walk more lately but has had a difficult time due to pain in his heels.  Patient appears to be suffering from bilateral Achilles tendinitis.  I will prescribe him a short course of NSAIDs to decrease inflammation and pain.  I counseled him on activity modification and wearing appropriate footwear.  Or for him to podiatry for insoles.  I reassured him that the pain would subside soon.  I counseled him to try nonimpact exercises in the meantime to avoid pain in his heels.  Plan: - Meloxicam 7.5 mg daily for 7 days - Podiatry referral

## 2020-05-20 NOTE — Assessment & Plan Note (Signed)
Patient presents for reevaluation of obesity.  Patient currently has a BMI 56.22 has associated complications from his obesity such as chronic bilateral low back pain with sciatica.  I counseled him on the importance of weight loss through caloric restriction and moderate intensity exercise daily.  Patient states that he tries to exercise but has developed pain at his heels bilaterally that appears to be secondary to Achilles tendinitis.  I offered him a referral to weight loss clinic and he agreed.  I also offered patient referral to podiatry to help with orthotics and proper footwear so that he would be able to maintain healthy exercise during his weight loss journey.  Patient may need physical therapy in the future to assist him with weight loss.  Plan: -Referral to weight loss clinic -Consider GLP-1 for weight loss in the future -Referral to podiatry for orthotics

## 2020-05-25 ENCOUNTER — Encounter: Payer: Self-pay | Admitting: *Deleted

## 2020-05-25 IMAGING — CR DG LUMBAR SPINE COMPLETE 4+V
5 series · 5 of 5 positions shown · non-contrast
Comparison: CT 07/31/2018

CLINICAL DATA: Severe low back pain, bilateral sciatica, post fall
in [id number]

EXAM:
LUMBAR SPINE - COMPLETE 4+ VIEW

[l-spine ap]
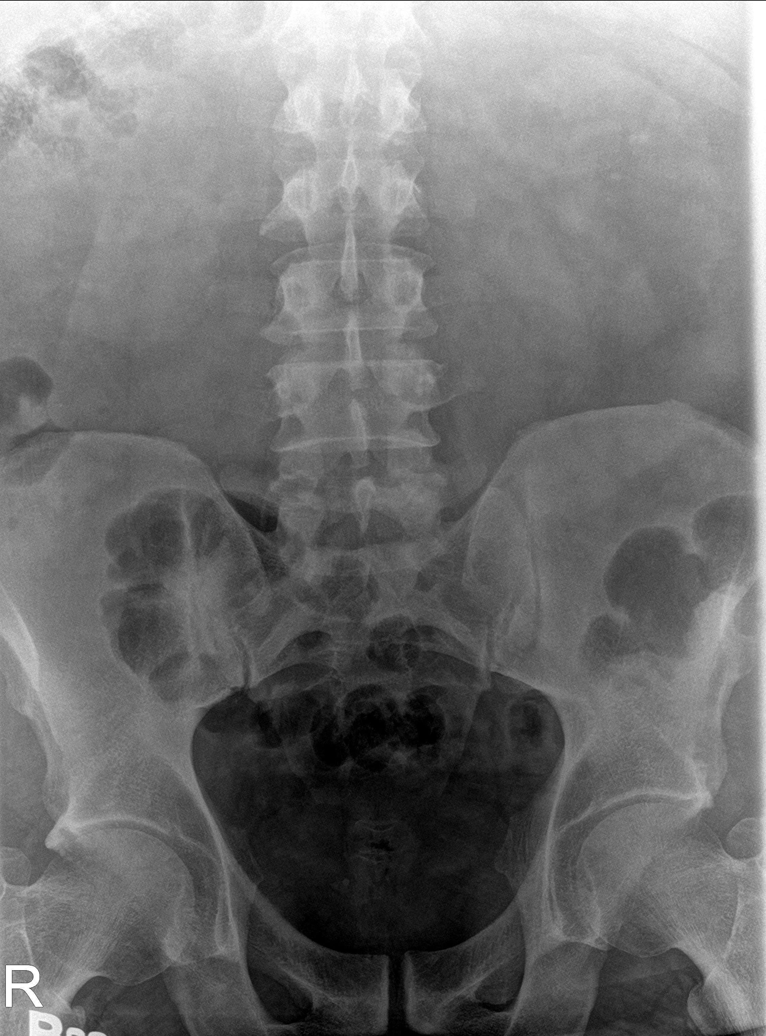

[l-spine obl (1 of 2)]
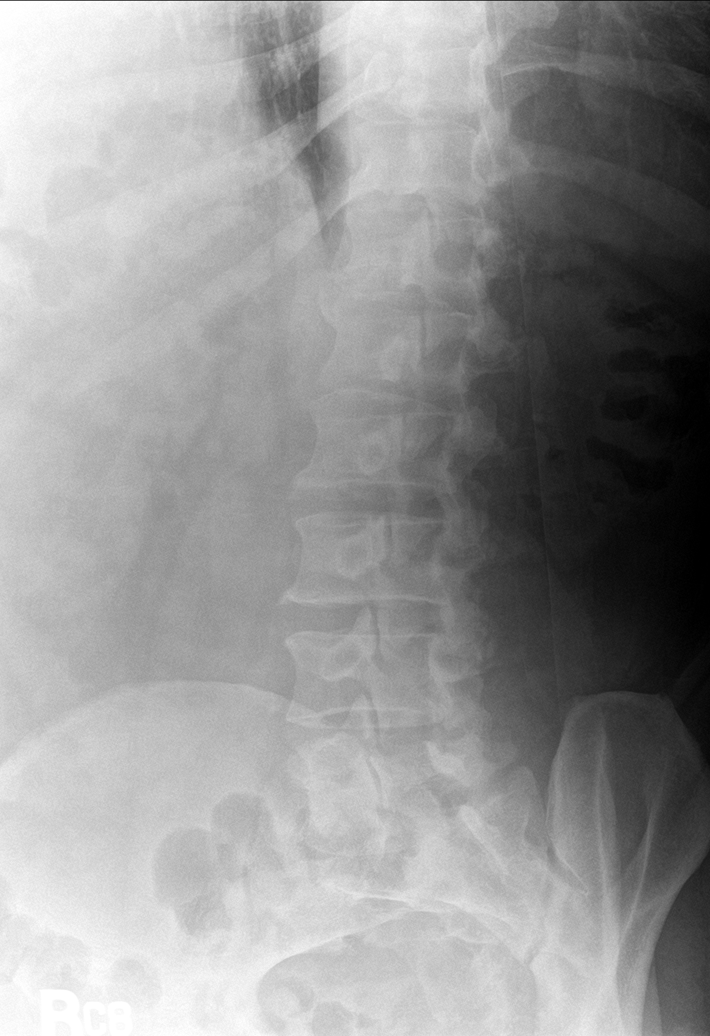

[l-spine obl (2 of 2)]
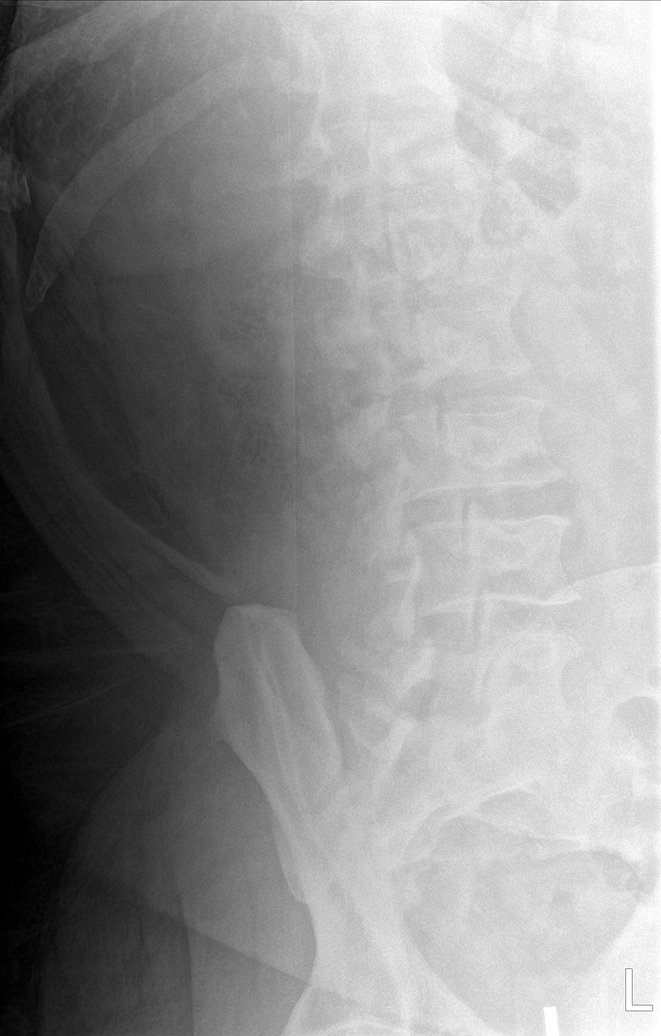

[l-spine lat]
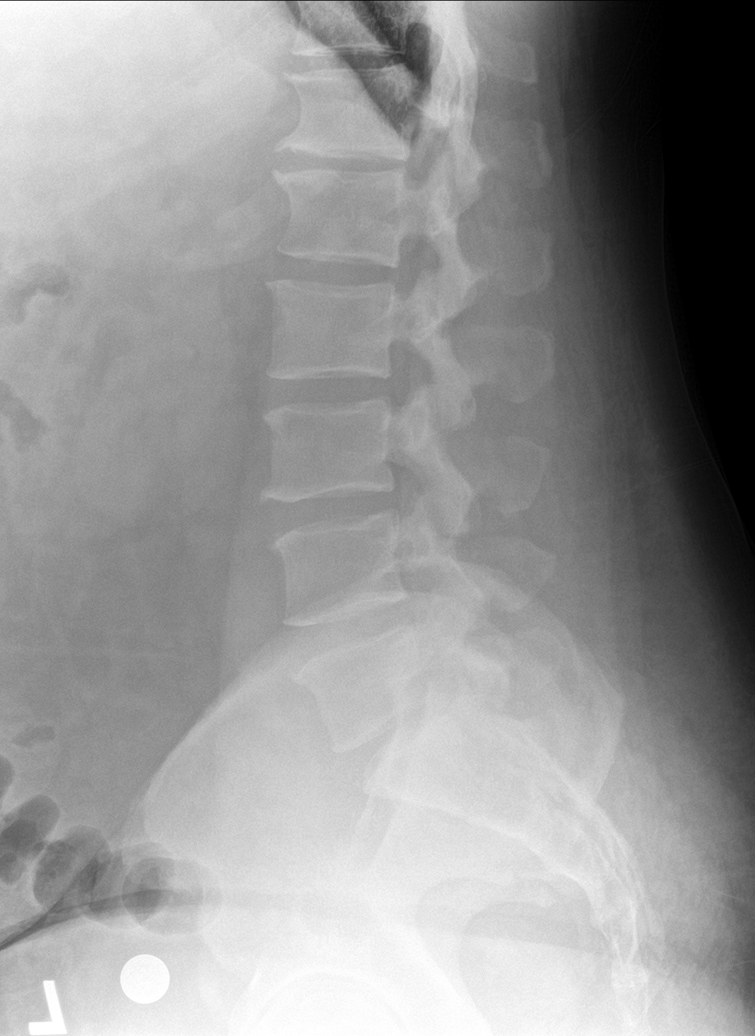

[l-spine spot]
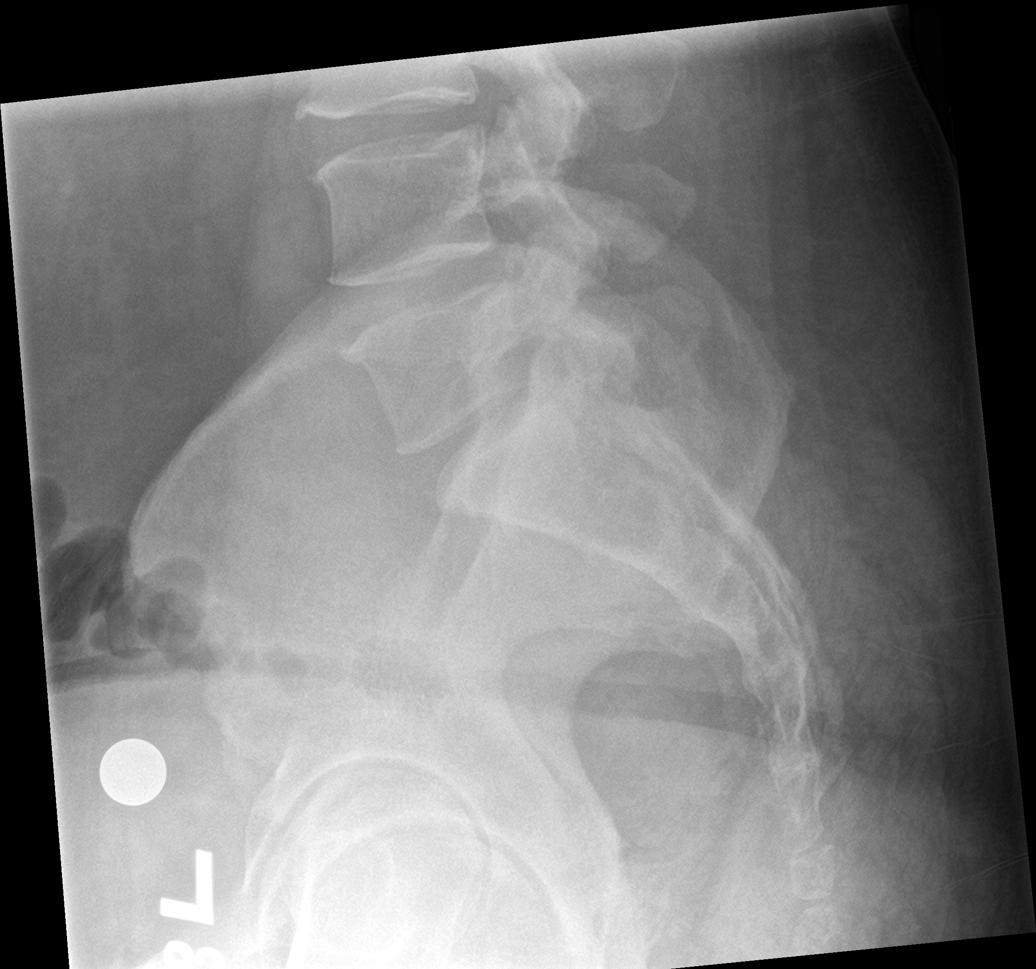

[5 of 5 positions shown; findings below may reference images not displayed]

FINDINGS: Normal alignment. No evidence of fracture. Vertebral body and disc
heights maintained throughout. Small anterior endplate spurs T12-L5.

Possible left renal calculi measure up to 5 mm.
IMPRESSION: 1. Negative for fracture or other acute bone abnormality.
2. Possible left renal calculi.

## 2020-05-25 IMAGING — CR DG THORACIC SPINE 3V
3 series · 3 of 3 positions shown · non-contrast
Comparison: None.

CLINICAL DATA: Severe mid back pain

EXAM:
THORACIC SPINE - 3 VIEWS

[t-spine swimmers]
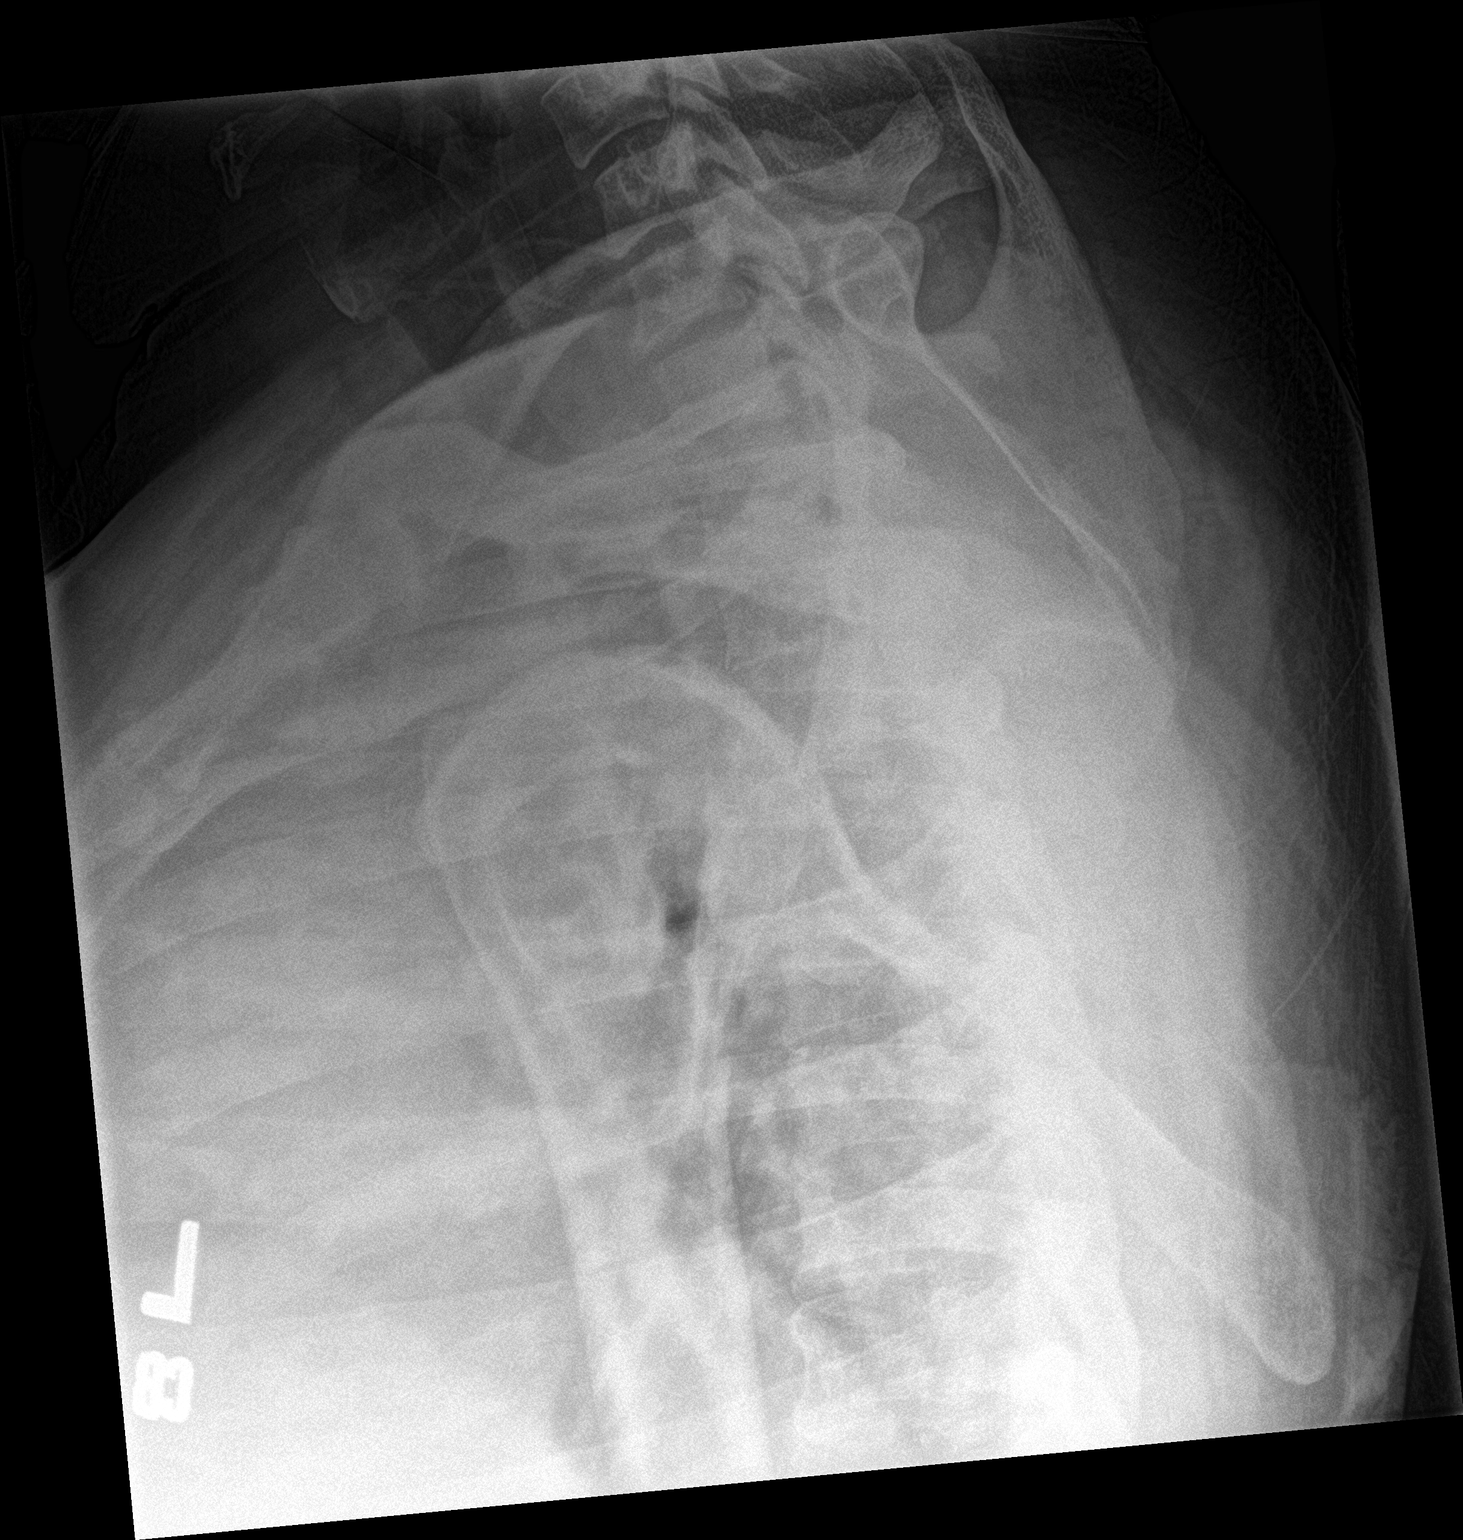

[t-spine ap]
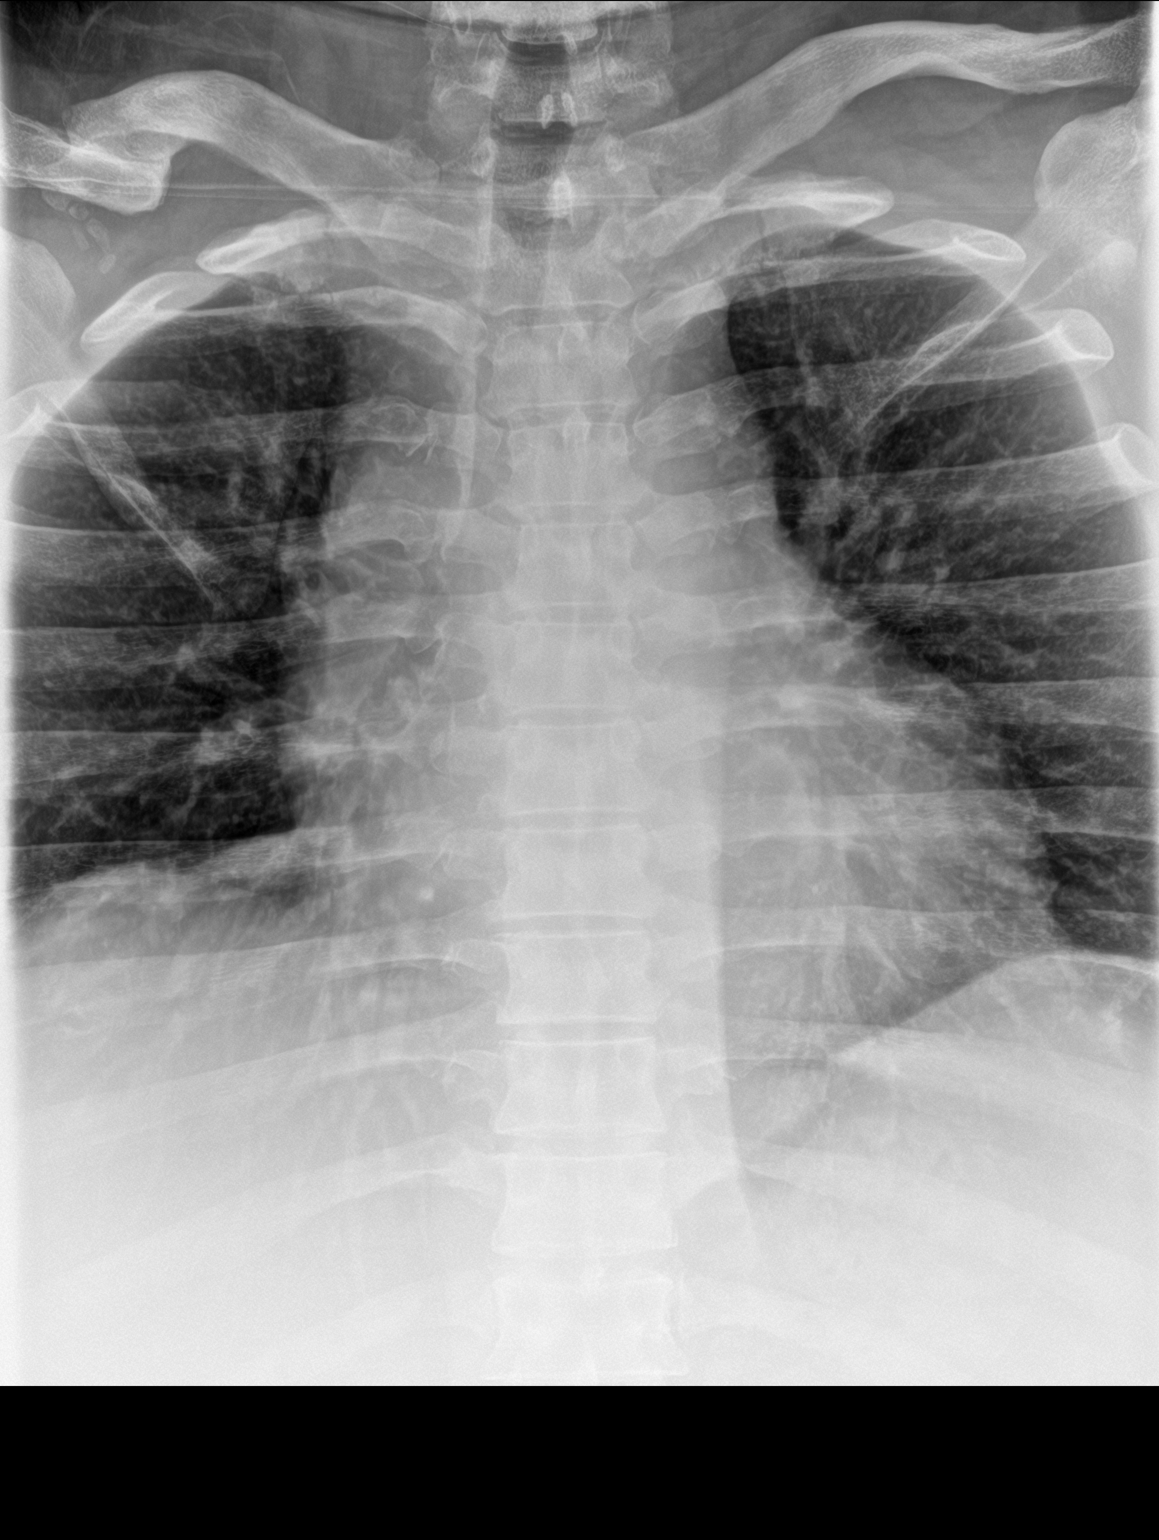

[t-spine lat]
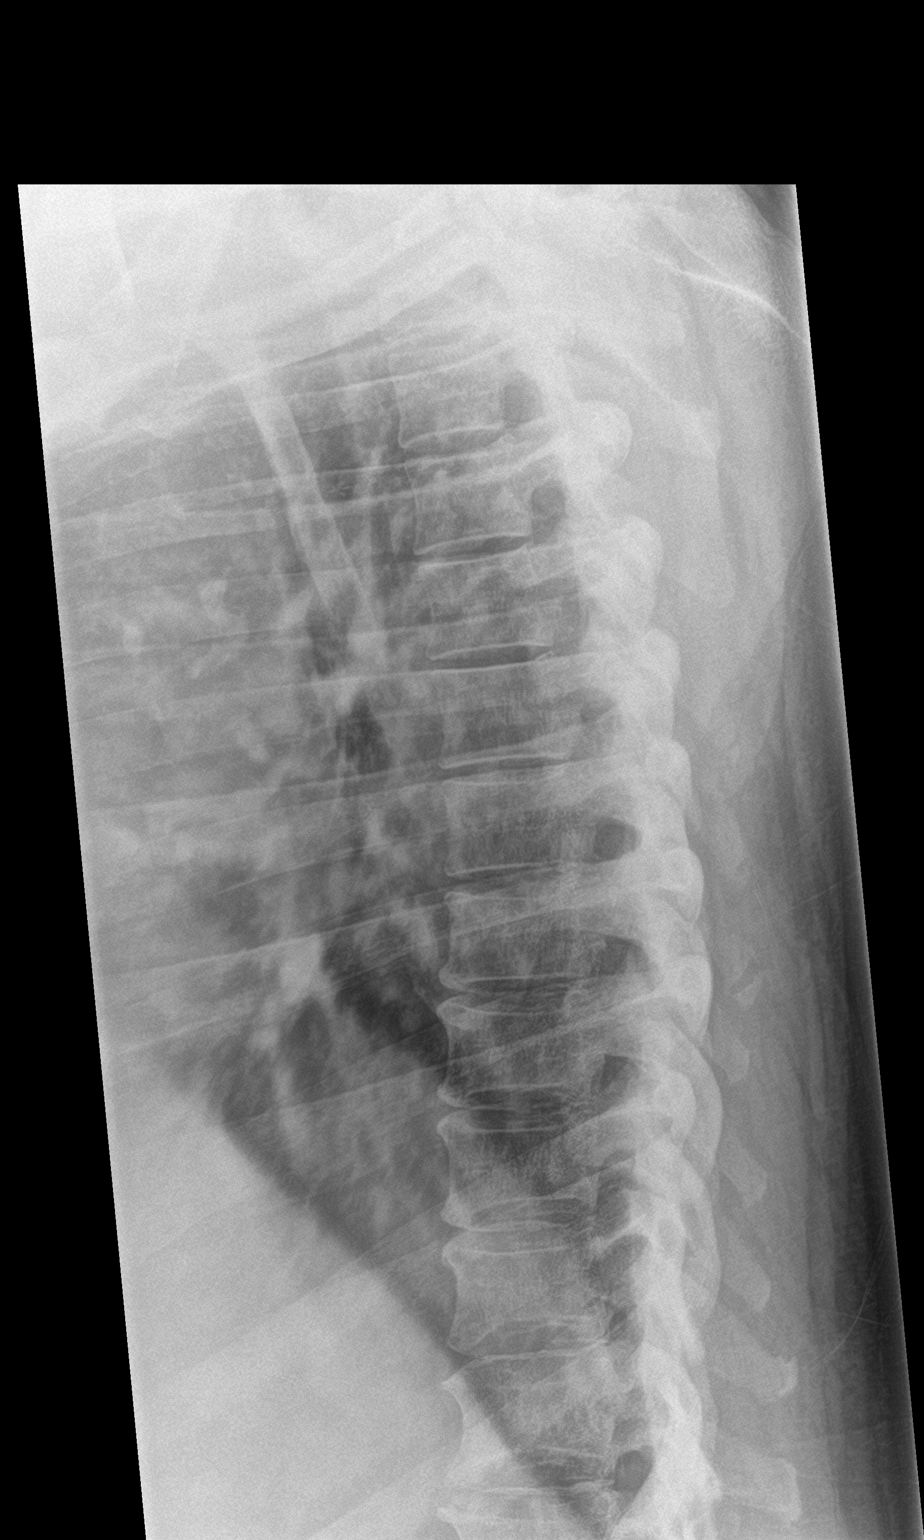

[3 of 3 positions shown; findings below may reference images not displayed]

FINDINGS: There is no evidence of thoracic spine fracture. Alignment is
normal. Old healed right clavicle fracture deformity. No other
significant bone abnormalities are identified.
IMPRESSION: Negative thoracic spine

## 2020-05-30 NOTE — Progress Notes (Signed)
Internal Medicine Clinic Attending  Case discussed with Dr. Coe  At the time of the visit.  We reviewed the resident's history and exam and pertinent patient test results.  I agree with the assessment, diagnosis, and plan of care documented in the resident's note.  

## 2020-06-02 ENCOUNTER — Ambulatory Visit (INDEPENDENT_AMBULATORY_CARE_PROVIDER_SITE_OTHER): Payer: Medicaid Other

## 2020-06-02 ENCOUNTER — Other Ambulatory Visit: Payer: Self-pay

## 2020-06-02 ENCOUNTER — Ambulatory Visit (INDEPENDENT_AMBULATORY_CARE_PROVIDER_SITE_OTHER): Payer: Medicaid Other | Admitting: Podiatry

## 2020-06-02 DIAGNOSIS — M7661 Achilles tendinitis, right leg: Secondary | ICD-10-CM

## 2020-06-02 DIAGNOSIS — M79672 Pain in left foot: Secondary | ICD-10-CM | POA: Diagnosis not present

## 2020-06-02 DIAGNOSIS — M7662 Achilles tendinitis, left leg: Secondary | ICD-10-CM

## 2020-06-02 DIAGNOSIS — M7730 Calcaneal spur, unspecified foot: Secondary | ICD-10-CM | POA: Diagnosis not present

## 2020-06-02 DIAGNOSIS — M79671 Pain in right foot: Secondary | ICD-10-CM

## 2020-06-02 MED ORDER — MELOXICAM 15 MG PO TABS: 15.0000 mg | ORAL_TABLET | Freq: Every day | ORAL | 0 refills | Status: DC

## 2020-06-02 NOTE — Patient Instructions (Signed)

## 2020-06-08 NOTE — Progress Notes (Signed)
Subjective:   Patient ID: Reginald Campbell, male   DOB: 44 y.o.   MRN: [id number]   HPI 44 year old male presents the office today for concerns of Achilles tenderness on both sides.  He states pain to the back of his heel which is been ongoing last several months.  Is tried elevation and float his ankles when laying down something touches the back of the heels without any significant improvement.  He states that if he moves his ankle syndrome she will feel discomfort of the back of his heel.  He denies any recent injury or trauma no swelling.  No other concerns today.   Review of Systems  All other systems reviewed and are negative.  Past Medical History:  Diagnosis Date   Arthritis    Asthma    Back pain    BRBPR (bright red blood per rectum) 09/03/4268   Complication of circumcision 01/23/2017   Depression    Family history of adverse reaction to anesthesia    daughter under anesthesia for 6 hours had problems with N/V    Family history of diabetes mellitus 04/07/2015   History of kidney stones    Hypertension    hx of elevated bp, no meds for htn   Lumbar radiculopathy, chronic 10/01/2016   Nephrolithiasis 04/18/2015   Phimosis    Rectal bleeding 03/07/2017   Sleep apnea    does not use cpap due to broke    Past Surgical History:  Procedure Laterality Date   CIRCUMCISION N/A 03/27/2017   Procedure: CIRCUMCISION ADULT with canal block;  Surgeon: Alexis Frock, MD;  Location: [facility];  Service: Urology;  Laterality: N/A;   CIRCUMCISION REVISION N/A 10/04/2017   Procedure: CIRCUMCISION REVISION;  Surgeon: Alexis Frock, MD;  Location: [facility];  Service: Urology;  Laterality: N/A;   NO PAST SURGERIES       Current Outpatient Medications:    clotrimazole-betamethasone (LOTRISONE) cream, Apply topically., Disp: , Rfl:    cyclobenzaprine (FLEXERIL) 5 MG tablet, Take 1 tablet (5 mg total) by mouth 3 (three) times daily as needed for muscle spasms., Disp: 30 tablet, Rfl:  0   DULoxetine (CYMBALTA) 30 MG capsule, Take 1 capsule (30 mg total) by mouth daily., Disp: 30 capsule, Rfl: 2   gabapentin (NEURONTIN) 100 MG capsule, Take 100 mg by mouth daily., Disp: , Rfl:    gabapentin (NEURONTIN) 300 MG capsule, Take 1 capsule (300 mg total) by mouth 3 (three) times daily as needed., Disp: 90 capsule, Rfl: 1   meloxicam (MOBIC) 15 MG tablet, Take 1 tablet (15 mg total) by mouth daily., Disp: 30 tablet, Rfl: 0  Allergies  Allergen Reactions   Penicillins Anaphylaxis, Hives, Swelling and Other (See Comments)    Pt was 15 Has patient had a PCN reaction causing immediate rash, facial/tongue/throat swelling, SOB or lightheadedness with hypotension: Yes Has patient had a PCN reaction causing severe rash involving mucus membranes or skin necrosis: No Has patient had a PCN reaction that required hospitalization: Yes Has patient had a PCN reaction occurring within the last 10 years: No If all of the above answers are "NO", then may proceed with Cephalosporin use.         Objective:  Physical Exam  General: AAO x3, NAD  Dermatological: Skin is warm, dry and supple bilateral. There are no open sores, no preulcerative lesions, no rash or signs of infection present.  Vascular: Dorsalis Pedis artery and Posterior Tibial artery pedal pulses are 2/4 bilateral with immedate capillary  fill time.  There is no pain with calf compression, swelling, warmth, erythema.   Neruologic: Grossly intact via light touch bilateral.  Negative Tinel sign.  Musculoskeletal: There is tenderness on the distal portion Achilles tendon on the insertion of the posterior calcaneus as well.  Tenderness to the posterior lateral aspect of calcaneus area of a prominent heel spur/prominence noted.  There is no edema, erythema.  Negative Thompson test.  Muscular strength 5/5 in all groups tested bilateral.  Gait: Unassisted, Nonantalgic.       Assessment:   44 year old male distal Achilles  tendon-itis, heel spur     Plan:  -Treatment options discussed including all alternatives, risks, and complications -Etiology of symptoms were discussed -X-rays were obtained and reviewed with the patient.  No evidence of acute fracture.  Mild calcaneal spurring is evident. -We discussed stretching, icing daily.  Prescribe meloxicam discussed side effects.  Discussed shoe modifications.  Dispensed a heel lift.  He can also use Voltaren gel if not taking oral anti-inflammatories.  Trula Slade DPM

## 2020-06-27 NOTE — Addendum Note (Signed)
Addended by: Hulan Fray on: 06/27/2020 05:46 PM   Modules accepted: Orders

## 2020-07-12 ENCOUNTER — Ambulatory Visit (HOSPITAL_COMMUNITY): Admission: RE | Admit: 2020-07-12 | Payer: Medicaid Other | Source: Ambulatory Visit

## 2020-08-31 ENCOUNTER — Ambulatory Visit: Payer: Medicaid Other | Admitting: Student

## 2020-08-31 ENCOUNTER — Other Ambulatory Visit: Payer: Self-pay

## 2020-08-31 ENCOUNTER — Encounter: Payer: Self-pay | Admitting: Student

## 2020-08-31 VITALS — BP 120/82 | HR 72 | Temp 97.5°F | Ht 70.0 in | Wt 388.5 lb

## 2020-08-31 DIAGNOSIS — Z0000 Encounter for general adult medical examination without abnormal findings: Secondary | ICD-10-CM | POA: Diagnosis not present

## 2020-08-31 DIAGNOSIS — Z1211 Encounter for screening for malignant neoplasm of colon: Secondary | ICD-10-CM | POA: Diagnosis not present

## 2020-08-31 DIAGNOSIS — M5442 Lumbago with sciatica, left side: Secondary | ICD-10-CM

## 2020-08-31 DIAGNOSIS — G8929 Other chronic pain: Secondary | ICD-10-CM | POA: Diagnosis not present

## 2020-08-31 DIAGNOSIS — M5441 Lumbago with sciatica, right side: Secondary | ICD-10-CM

## 2020-08-31 NOTE — Patient Instructions (Signed)
Reginald Campbell,  It was a pleasure seeing you in the clinic today.   We discussed the following today:  1. I have placed the referral to GI for your colonoscopy. Someone will contact you to schedule this.  2. I have replaced an order for an MRI. Please let us know if you are able to get one. If not, please try contacting your insurance company and seeing if you can appeal their decision in order to obtain it.  Please call our clinic at [phone] if you have any questions or concerns. The best time to call is Monday-Friday from 9am-4pm, but there is someone available 24/7 at the same number. If you need medication refills, please notify your pharmacy one week in advance and they will send [place] a request.   Thank you for letting us take part in your care. We look forward to seeing you next time!

## 2020-08-31 NOTE — Progress Notes (Signed)
   CC: f/u for chronic low back pain  HPI:  [name] is a 45 y.o. 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 Please see individualized A&P for full HPI.  Past Medical History:  Diagnosis Date  . Arthritis   . Asthma   . Back pain   . BRBPR (bright red blood per rectum) 04/07/2015  . 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 Depression   . Family history of adverse reaction to anesthesia    daughter under anesthesia for 6 hours had problems with N/V   . Family history of diabetes mellitus 04/07/2015  . History of kidney stones   . Hypertension    hx of elevated bp, no meds for htn  . Lumbar radiculopathy, chronic 10/01/2016  . Nephrolithiasis 04/18/2015  . Phimosis   . Rectal bleeding 03/07/2017  . Sleep apnea    does not use cpap due to broke   Review of Systems:  Negative aside from that listed in individualized A&P.  Physical Exam:  Vitals:   08/31/20 1418 08/31/20 1420  BP:  120/82  Pulse:  72  Temp:  (!) 97.5 F (36.4 C)  TempSrc:  Oral  SpO2:  98%  Weight: (!) 388 lb 8 oz (176.2 kg)   Height: 5\' 10"  (1.778 m)    Physical Exam Constitutional:      Appearance: He is obese. He is not ill-appearing.  HENT:     Head: Normocephalic and atraumatic.     Mouth/Throat:     Mouth: Mucous membranes are moist.     Pharynx: Oropharynx is clear. No oropharyngeal exudate.  Eyes:     Extraocular Movements: Extraocular movements intact.     Conjunctiva/sclera: Conjunctivae normal.     Pupils: Pupils are equal, round, and reactive to light.  Cardiovascular:     Rate and Rhythm: Normal rate and regular rhythm.     Pulses: Normal pulses.     Heart sounds: Normal heart sounds. No murmur heard. No friction rub. No gallop.   Pulmonary:     Effort: Pulmonary effort is normal.     Breath sounds: Normal breath sounds. No wheezing, rhonchi or rales.   Abdominal:     General: Bowel sounds are normal. There is no distension.     Palpations: Abdomen is soft.     Tenderness: There is no abdominal tenderness.  Musculoskeletal:     Cervical back: Normal range of motion.     Comments: TTP in lower lumbar spine. Straight leg test elicits radiation of pain to bilateral knees  Skin:    General: Skin is warm and dry.     Capillary Refill: Capillary refill takes less than 2 seconds.  Neurological:     General: No focal deficit present.     Mental Status: He is alert and oriented to person, place, and time.  Psychiatric:        Mood and Affect: Mood normal.        Behavior: Behavior normal.        Thought Content: Thought content normal.      Assessment & Plan:   See Encounters Tab for problem based charting.  Patient discussed with Dr. Evette Doffing

## 2020-08-31 NOTE — Assessment & Plan Note (Signed)
Continues to be uninterested in COVID vaccine.

## 2020-08-31 NOTE — Assessment & Plan Note (Signed)
Placed referral to GI for colon cancer screening. Patient does report family history of cancers, but unsure if any were GI cancers.  -f/u colonoscopy results

## 2020-08-31 NOTE — Assessment & Plan Note (Addendum)
Patient with history of chronic lumbar radiculopathy. He continues to experience severe, function-limiting pain in his lower back along with bilateral sciatica. His sciatica is somewhat better controlled with his gabapentin and cymbalta, but his low back pain continues to impede on his mobility and is hindering him from performing his usual ADLs. He has failed conservative therapy and NSAIDS/flexeril only provide minimal relief. We have tried to obtain an MRI to guide [place] on whether we should pursue steroid injections vs surgical interventions (which the patient is interested in) but have been unable to obtain one due to lack of insurance approval. We will try again because patient's symptoms remain severely function limiting to the point where he was laid off of work due to his inability to perform his usual role. I continue to believe that the patient would benefit from receiving an MRI so that we can obtain some guidance as to how to manage patients symptoms in the future (steroids vs surgical intervention). Unfortunately, patient's financial situation prevents paying for an MRI out of pocket.  Plan: -obtain MRI -continue flexeril TID prn and gabapentin TID prn -continue conservative measures (stretching, etc.) -continue cymbalta 30mg  qd -at next visit, can discuss possibility of weight loss medications (phentermine, etc) to help promote weight loss and place less strain on lower back

## 2020-09-01 NOTE — Progress Notes (Signed)
Internal Medicine Clinic Attending  Case discussed with Dr. Jinwala  At the time of the visit.  We reviewed the resident's history and exam and pertinent patient test results.  I agree with the assessment, diagnosis, and plan of care documented in the resident's note.  

## 2020-09-08 ENCOUNTER — Other Ambulatory Visit: Payer: Self-pay

## 2020-09-08 ENCOUNTER — Emergency Department (HOSPITAL_COMMUNITY)
Admission: EM | Admit: 2020-09-08 | Discharge: 2020-09-08 | Disposition: A | Discharge status: Home / Self Care | Payer: Medicaid Other | Attending: Emergency Medicine | Admitting: Emergency Medicine

## 2020-09-08 ENCOUNTER — Emergency Department (HOSPITAL_COMMUNITY): Payer: Medicaid Other

## 2020-09-08 ENCOUNTER — Encounter (HOSPITAL_COMMUNITY): Payer: Self-pay | Admitting: Emergency Medicine

## 2020-09-08 DIAGNOSIS — J45909 Unspecified asthma, uncomplicated: Secondary | ICD-10-CM | POA: Diagnosis not present

## 2020-09-08 DIAGNOSIS — I10 Essential (primary) hypertension: Secondary | ICD-10-CM | POA: Diagnosis not present

## 2020-09-08 DIAGNOSIS — M791 Myalgia, unspecified site: Secondary | ICD-10-CM | POA: Diagnosis not present

## 2020-09-08 DIAGNOSIS — R0602 Shortness of breath: Secondary | ICD-10-CM | POA: Diagnosis not present

## 2020-09-08 DIAGNOSIS — Z20822 Contact with and (suspected) exposure to covid-19: Secondary | ICD-10-CM | POA: Insufficient documentation

## 2020-09-08 DIAGNOSIS — Z87891 Personal history of nicotine dependence: Secondary | ICD-10-CM | POA: Insufficient documentation

## 2020-09-08 DIAGNOSIS — R059 Cough, unspecified: Secondary | ICD-10-CM | POA: Diagnosis not present

## 2020-09-08 DIAGNOSIS — R52 Pain, unspecified: Secondary | ICD-10-CM

## 2020-09-08 DIAGNOSIS — R21 Rash and other nonspecific skin eruption: Secondary | ICD-10-CM | POA: Diagnosis not present

## 2020-09-08 LAB — CBC WITH DIFFERENTIAL/PLATELET
Abs Immature Granulocytes: 0.02 10*3/uL (ref 0.00–0.07)
Basophils Absolute: 0 10*3/uL (ref 0.0–0.1)
Basophils Relative: 0 %
Eosinophils Absolute: 0.2 10*3/uL (ref 0.0–0.5)
Eosinophils Relative: 2 %
HCT: 43.3 % (ref 39.0–52.0)
Hemoglobin: 14.4 g/dL (ref 13.0–17.0)
Immature Granulocytes: 0 %
Lymphocytes Relative: 23 %
Lymphs Abs: 1.7 10*3/uL (ref 0.7–4.0)
MCH: 29.8 pg (ref 26.0–34.0)
MCHC: 33.3 g/dL (ref 30.0–36.0)
MCV: 89.5 fL (ref 80.0–100.0)
Monocytes Absolute: 0.4 10*3/uL (ref 0.1–1.0)
Monocytes Relative: 6 %
Neutro Abs: 5.2 10*3/uL (ref 1.7–7.7)
Neutrophils Relative %: 69 %
Platelets: 147 10*3/uL — ABNORMAL LOW (ref 150–400)
RBC: 4.84 MIL/uL (ref 4.22–5.81)
RDW: 14.3 % (ref 11.5–15.5)
WBC: 7.5 10*3/uL (ref 4.0–10.5)
nRBC: 0 % (ref 0.0–0.2)

## 2020-09-08 LAB — COMPREHENSIVE METABOLIC PANEL
ALT: 16 U/L (ref 0–44)
AST: 23 U/L (ref 15–41)
Albumin: 3.4 g/dL — ABNORMAL LOW (ref 3.5–5.0)
Alkaline Phosphatase: 83 U/L (ref 38–126)
Anion gap: 10 (ref 5–15)
BUN: 12 mg/dL (ref 6–20)
CO2: 26 mmol/L (ref 22–32)
Calcium: 8.9 mg/dL (ref 8.9–10.3)
Chloride: 104 mmol/L (ref 98–111)
Creatinine, Ser: 0.96 mg/dL (ref 0.61–1.24)
GFR, Estimated: >60 mL/min (ref >60)
Glucose, Bld: 120 mg/dL — ABNORMAL HIGH (ref 70–99)
Potassium: 4.4 mmol/L (ref 3.5–5.1)
Sodium: 140 mmol/L (ref 135–145)
Total Bilirubin: 0.7 mg/dL (ref 0.3–1.2)
Total Protein: 6.7 g/dL (ref 6.5–8.1)

## 2020-09-08 LAB — RESP PANEL BY RT-PCR (FLU A&B, COVID) ARPGX2
Influenza A by PCR: NEGATIVE
Influenza B by PCR: NEGATIVE
SARS Coronavirus 2 by RT PCR: NEGATIVE

## 2020-09-08 NOTE — ED Triage Notes (Signed)
Back/neck/chest/shoulders hurt "muscles ache" Covid exposure 1 week ago--- has not had a fever or shortness of breath. Has a cough Unvaccinated

## 2020-09-08 NOTE — ED Provider Notes (Signed)
[facility] EMERGENCY DEPARTMENT Provider Note   CSN: [id number] Arrival date & time: 09/08/20  1534     History Chief Complaint  Patient presents with  . muscle aches  . covid symptoms    Reginald Campbell is a 45 y.o. male.  The history is provided by the patient and medical records.    45 year old male with history of arthritis, asthma, depression, hypertension, presenting to the ED with body aches, cough, and feeling somewhat "off".  States last week he was dusting and deep cleaning his house and he noticed increase in his asthma/allergic type symptoms.  States he got a little worse and recently found out his cousin tested positive for Covid after they stop by his house to see his grandchild.  He has not had any chest pain or shortness of breath.  He is eating and drinking well, still has normal taste and smell.  No vomiting or diarrhea.  States mostly he just feels sore in his upper back.  Does have chronic seasonal allergies and usually takes Claritin but has not had this in a few months.  Past Medical History:  Diagnosis Date  . Arthritis   . Asthma   . Back pain   . BRBPR (bright red blood per rectum) 04/07/2015  . 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 Depression   . Family history of adverse reaction to anesthesia    daughter under anesthesia for 6 hours had problems with N/V   . Family history of diabetes mellitus 04/07/2015  . History of kidney stones   . Hypertension    hx of elevated bp, no meds for htn  . Lumbar radiculopathy, chronic 10/01/2016  . Nephrolithiasis 04/18/2015  . Phimosis   . Rectal bleeding 03/07/2017  . Sleep apnea    does not use cpap due to broke    Patient Active Problem List   Diagnosis Date Noted  . Screening for colon cancer 08/31/2020  . Achilles tendonitis, bilateral 05/20/2020  . Atypical mole 03/12/2019  . HTN (hypertension) 10/22/2018  . Chronic bilateral low back pain with bilateral sciatica 04/09/2018  . Internal  hemorrhoids 03/08/2017  . Morbid obesity ([place]) 10/02/2016  . MDD (major depressive disorder) 10/01/2016  . Bilateral shoulder pain 08/18/2015  . Healthcare maintenance 04/07/2015  . Obstructive sleep apnea 04/07/2015    Past Surgical History:  Procedure Laterality Date  . CIRCUMCISION N/A 03/27/2017   Procedure: CIRCUMCISION ADULT with canal block;  Surgeon: Alexis Frock, MD;  Location: [facility];  Service: Urology;  Laterality: N/A;  . CIRCUMCISION REVISION N/A 10/04/2017   Procedure: CIRCUMCISION REVISION;  Surgeon: Alexis Frock, MD;  Location: [facility];  Service: Urology;  Laterality: N/A;  . NO PAST SURGERIES         Family History  Problem Relation Age of Onset  . Heart disease Mother   . Diabetes Father   . Hypertension Father   . Cirrhosis Father   . Heart disease Maternal Grandfather   . Melanoma Paternal Grandmother   . Lung cancer Paternal Uncle   . Lung cancer Paternal Aunt   . Asthma Daughter   . Gallbladder disease Daughter   . Erythema nodosum Daughter     Social History   Tobacco Use  . Smoking status: Former Smoker    Packs/day: 1.00    Years: 1.00    Pack years: 1.00    Quit date: 07/30/1994    Years since quitting: 26.1  . Smokeless tobacco: Never Used  Vaping  Use  . Vaping Use: Never used  Substance Use Topics  . Alcohol use: No    Alcohol/week: 0.0 standard drinks  . Drug use: No    Home Medications Prior to Admission medications   Medication Sig Start Date End Date Taking? Authorizing Provider  clotrimazole-betamethasone (LOTRISONE) cream Apply topically. 03/29/20   [provider]  cyclobenzaprine (FLEXERIL) 5 MG tablet Take 1 tablet (5 mg total) by mouth 3 (three) times daily as needed for muscle spasms. 04/21/20   Maudie Mercury, MD  DULoxetine (CYMBALTA) 30 MG capsule Take 1 capsule (30 mg total) by mouth daily. 03/22/20 03/22/21  Axel Filler, MD  gabapentin (NEURONTIN) 100 MG capsule Take 100 mg by mouth daily. 05/20/20    [provider]  gabapentin (NEURONTIN) 300 MG capsule Take 1 capsule (300 mg total) by mouth 3 (three) times daily as needed. 04/21/20 04/21/21  Maudie Mercury, MD  meloxicam (MOBIC) 15 MG tablet Take 1 tablet (15 mg total) by mouth daily. 06/02/20 06/02/21  Trula Slade, DPM    Allergies    Penicillins  Review of Systems   Review of Systems  Respiratory: Positive for cough.   Musculoskeletal: Positive for myalgias.  All other systems reviewed and are negative.   Physical Exam Updated Vital Signs BP 115/88 (BP Location: Right Arm)   Pulse 67   Temp 99 F (37.2 C)   Resp 17   SpO2 99%   Physical Exam Vitals and nursing note reviewed.  Constitutional:      Appearance: He is well-developed and well-nourished. He is obese.  HENT:     Head: Normocephalic and atraumatic.     Mouth/Throat:     Mouth: Oropharynx is clear and moist.  Eyes:     Extraocular Movements: EOM normal.     Conjunctiva/sclera: Conjunctivae normal.     Pupils: Pupils are equal, round, and reactive to light.  Cardiovascular:     Rate and Rhythm: Normal rate and regular rhythm.     Heart sounds: Normal heart sounds.  Pulmonary:     Effort: Pulmonary effort is normal. No respiratory distress.     Breath sounds: Normal breath sounds. No wheezing or rhonchi.     Comments: Lungs are clear, no distress, no cough noted during exam Abdominal:     General: Bowel sounds are normal.     Palpations: Abdomen is soft.     Tenderness: There is no abdominal tenderness. There is no rebound.  Musculoskeletal:        General: Normal range of motion.     Cervical back: Normal range of motion.  Skin:    General: Skin is warm and dry.  Neurological:     Mental Status: He is alert and oriented to person, place, and time.  Psychiatric:        Mood and Affect: Mood and affect normal.     ED Results / Procedures / Treatments   Labs (all labs ordered are listed, but only abnormal results are  displayed) Labs Reviewed  COMPREHENSIVE METABOLIC PANEL - Abnormal; Notable for the following components:      Result Value   Glucose, Bld 120 (*)    Albumin 3.4 (*)    All other components within normal limits  CBC WITH DIFFERENTIAL/PLATELET - Abnormal; Notable for the following components:   Platelets 147 (*)    All other components within normal limits  RESP PANEL BY RT-PCR (FLU A&B, COVID) ARPGX2    EKG None  Radiology DG  Chest Portable 1 View  Result Date: 09/08/2020 CLINICAL DATA:  45 year old male with shortness of breath. COVID-19 exposure. EXAM: PORTABLE CHEST 1 VIEW COMPARISON:  Chest radiograph dated 06/09/2018. FINDINGS: No focal consolidation, pleural effusion or pneumothorax the cardiac silhouette is within limits. No acute osseous pathology. IMPRESSION: No active disease. Electronically Signed   By: Anner Crete M.D.   On: 09/08/2020 16:41    Procedures Procedures   Medications Ordered in ED Medications - No data to display  ED Course  I have reviewed the triage vital signs and the nursing notes.  Pertinent labs & imaging results that were available during my care of the patient were reviewed by me and considered in my medical decision making (see chart for details).    MDM Rules/Calculators/A&P  45 y.o. M here with cough and body aches.  He is afebrile, clinically appears well without signs of distress.  VSS.  Labs reassuring, covid/flu screen negative.  CXR clear.  He does mention that symptoms started after cleaning his house (vacuuming and dusting) so may have triggered his allergies.  Encouraged to re-start his daily Claritin and continue other supportive care.  Close follow-up with PCP.  Return here for any new/acute changes.  Final Clinical Impression(s) / ED Diagnoses Final diagnoses:  Body aches  Cough    Rx / DC Orders ED Discharge Orders    None       Larene Pickett, PA-C 09/08/20 2217    Lajean Saver, MD 09/09/20 1505

## 2020-09-08 NOTE — Discharge Instructions (Signed)
I would try and re-start your claritin.  If you temporarily need benadryl for more immediate relief that is fine but do not take both together long term. Make sure to rest and stay hydrated. Follow-up with your primary care doctor. Return here for new concerns.

## 2020-09-15 ENCOUNTER — Encounter: Payer: Self-pay | Admitting: *Deleted

## 2020-09-30 ENCOUNTER — Encounter: Payer: Self-pay | Admitting: Gastroenterology

## 2020-10-24 ENCOUNTER — Telehealth: Payer: Self-pay | Admitting: *Deleted

## 2020-10-24 NOTE — Telephone Encounter (Signed)
Please schedule OV first.  Thank you.

## 2020-10-24 NOTE — Telephone Encounter (Signed)
Left message asking patient to call and schedule a new patient office visit with Dr Cathleen Corti or APP before proceeding with colonoscopy, pt BMI 55.7 .

## 2020-10-24 NOTE — Telephone Encounter (Signed)
Patient BMI as of 08/2020 was 55.7.   Office visit or directly scheduled at  [facility]?

## 2020-11-22 ENCOUNTER — Encounter: Payer: Medicaid Other | Admitting: Gastroenterology

## 2020-11-25 ENCOUNTER — Ambulatory Visit: Payer: Medicaid Other | Admitting: Gastroenterology

## 2020-11-29 ENCOUNTER — Encounter: Payer: Self-pay | Admitting: Gastroenterology

## 2021-01-03 ENCOUNTER — Ambulatory Visit (INDEPENDENT_AMBULATORY_CARE_PROVIDER_SITE_OTHER): Payer: Medicaid Other | Admitting: Gastroenterology

## 2021-01-03 ENCOUNTER — Encounter: Payer: Self-pay | Admitting: Gastroenterology

## 2021-01-03 VITALS — BP 124/84 | HR 79 | Ht 70.0 in | Wt 388.2 lb

## 2021-01-03 DIAGNOSIS — G4733 Obstructive sleep apnea (adult) (pediatric): Secondary | ICD-10-CM | POA: Diagnosis not present

## 2021-01-03 DIAGNOSIS — Z6841 Body Mass Index (BMI) 40.0 and over, adult: Secondary | ICD-10-CM | POA: Diagnosis not present

## 2021-01-03 DIAGNOSIS — Z1212 Encounter for screening for malignant neoplasm of rectum: Secondary | ICD-10-CM | POA: Diagnosis not present

## 2021-01-03 DIAGNOSIS — Z1211 Encounter for screening for malignant neoplasm of colon: Secondary | ICD-10-CM | POA: Diagnosis not present

## 2021-01-03 MED ORDER — SUPREP BOWEL PREP KIT 17.5-3.13-1.6 GM/177ML PO SOLN: 1.0000 | ORAL | 0 refills | Status: DC

## 2021-01-03 NOTE — Patient Instructions (Signed)
If you are age 47 or older, your body mass index should be between 23-30. Your Body mass index is 55.71 kg/m. If this is out of the aforementioned range listed, please consider follow up with your Primary Care Provider.  If you are age 39 or younger, your body mass index should be between 19-25. Your Body mass index is 55.71 kg/m. If this is out of the aformentioned range listed, please consider follow up with your Primary Care Provider.   __________________________________________________________  The [facility] providers would like to encourage you to use [facility] to communicate with providers for non-urgent requests or questions.  Due to long hold times on the telephone, sending your provider a message by [facility] may be a faster and more efficient way to get a response.  Please allow 48 business hours for a response.  Please remember that this is for non-urgent requests.   We have sent the following medications to your pharmacy for you to pick up at your convenience:  Suprep  Due to recent changes in healthcare laws, you may see the results of your imaging and laboratory studies on MyChart before your provider has had a chance to review them.  We understand that in some cases there may be results that are confusing or concerning to you. Not all laboratory results come back in the same time frame and the provider may be waiting for multiple results in order to interpret others.  Please give [place] 48 hours in order for your provider to thoroughly review all the results before contacting the office for clarification of your results.   Thank you for choosing me and [facility].  Vito Cirigliano, D.O.

## 2021-01-03 NOTE — Progress Notes (Signed)
Chief Complaint: Colon cancer screening   Referring Provider:     Virl Axe, MD   HPI:     Reginald Campbell is a 45 y.o. male with a history of HTN, OSA (not using his CPAP),chronic low back pain, depression, obesity (BMI 55), referred to the [facility] for evaluation of colon cancer screening.   No family history of CRC or related malignancies, and patient is without any active GI sxs. Denies any melena, hematochezia, nausea, vomiting, diarrhea, constipation, change in bowel habits, early satiety, or abdominal pain, and no fever, chills, night sweats or weight loss. No previous CRC screening to date.   Father with EtOH cirrhosis and HCC.    Past Medical History:  Diagnosis Date  . Arthritis   . Asthma   . Back pain   . BRBPR (bright red blood per rectum) 04/07/2015  . 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 Depression   . Family history of adverse reaction to anesthesia    daughter under anesthesia for 6 hours had problems with N/V   . Family history of diabetes mellitus 04/07/2015  . History of kidney stones   . Hypertension    hx of elevated bp, no meds for htn  . Lumbar radiculopathy, chronic 10/01/2016  . Nephrolithiasis 04/18/2015  . Phimosis   . Rectal bleeding 03/07/2017  . Sleep apnea    does not use cpap due to broke     Past Surgical History:  Procedure Laterality Date  . CIRCUMCISION N/A 03/27/2017   Procedure: CIRCUMCISION ADULT with canal block;  Surgeon: Alexis Frock, MD;  Location: [facility];  Service: Urology;  Laterality: N/A;  . CIRCUMCISION REVISION N/A 10/04/2017   Procedure: CIRCUMCISION REVISION;  Surgeon: Alexis Frock, MD;  Location: [facility];  Service: Urology;  Laterality: N/A;  . NO PAST SURGERIES     Family History  Problem Relation Age of Onset  . Heart disease Mother   . Diabetes Father   . Hypertension Father   . Cirrhosis Father   . Liver cancer Father   . Heart disease Maternal Grandfather   . Melanoma  Paternal Grandmother   . Lung cancer Paternal Uncle   . Lung cancer Paternal Aunt   . Asthma Daughter   . Gallbladder disease Daughter   . Erythema nodosum Daughter   . Pancreatic cancer Neg Hx   . Esophageal cancer Neg Hx    Social History   Tobacco Use  . Smoking status: Former Smoker    Packs/day: 1.00    Years: 1.00    Pack years: 1.00    Quit date: 07/30/1994    Years since quitting: 26.4  . Smokeless tobacco: Never Used  Vaping Use  . Vaping Use: Never used  Substance Use Topics  . Alcohol use: No    Alcohol/week: 0.0 standard drinks  . Drug use: No   No current outpatient medications on file.   No current facility-administered medications for this visit.   Allergies  Allergen Reactions  . Penicillins Anaphylaxis, Hives, Swelling and Other (See Comments)    Pt was 15 Has patient had a PCN reaction causing immediate rash, facial/tongue/throat swelling, SOB or lightheadedness with hypotension: Yes Has patient had a PCN reaction causing severe rash involving mucus membranes or skin necrosis: No Has patient had a PCN reaction that required hospitalization: Yes Has patient had a PCN reaction occurring within the last 10 years: No If all of  the above answers are "NO", then may proceed with Cephalosporin use.      Review of Systems: All systems reviewed and negative except where noted in HPI.     Physical Exam:    Wt Readings from Last 3 Encounters:  01/03/21 (!) 388 lb 4 oz (176.1 kg)  08/31/20 (!) 388 lb 8 oz (176.2 kg)  05/19/20 (!) 391 lb 12.8 oz (177.7 kg)    BP 124/84   Pulse 79   Ht 5\' 10"  (1.778 m)   Wt (!) 388 lb 4 oz (176.1 kg)   SpO2 98%   BMI 55.71 kg/m  Constitutional:  Pleasant, in no acute distress. Psychiatric: Normal mood and affect. Behavior is normal. EENT: Pupils normal.  Conjunctivae are normal. No scleral icterus. Neck supple. No cervical LAD. Cardiovascular: Normal rate, regular rhythm. No edema Pulmonary/chest: Effort normal and  breath sounds normal. No wheezing, rales or rhonchi. Abdominal: Soft, nondistended, nontender. Bowel sounds active throughout. There are no masses palpable. No hepatomegaly. Neurological: Alert and oriented to person place and time. Skin: Skin is warm and dry. No rashes noted.   ASSESSMENT AND PLAN;   1) Colon Cancer screening 2) Obesity (BMI 55) 3) OSA  - Schedule colonoscopy for routine, average risk CRC screening - Procedure to be scheduled at [facility] due to elevated periprocedural risks related to morbid obesity and OSA  The indications, risks, and benefits of colonoscopy were explained to the patient in detail. Risks include but are not limited to bleeding, perforation, adverse reaction to medications, and cardiopulmonary compromise. Sequelae include but are not limited to the possibility of surgery, hospitalization, and mortality. The patient verbalized understanding and wished to proceed. All questions answered, referred to the scheduler and bowel prep ordered. Further recommendations pending results of the exam.     Lavena Bullion, DO, FACG  01/03/2021, 1:36 PM   Virl Axe, MD

## 2021-02-17 NOTE — Progress Notes (Signed)
Attempted to obtain medical history via telephone, unable to reach at this time.  

## 2021-02-22 NOTE — Anesthesia Preprocedure Evaluation (Addendum)
Anesthesia Evaluation  Patient identified by MRN, date of birth, ID band Patient awake    Reviewed: Allergy & Precautions, NPO status , Patient's Chart, lab work & pertinent test results  Airway Mallampati: III  TM Distance: >3 FB Neck ROM: Full    Dental no notable dental hx. (+) Teeth Intact, Dental Advisory Given   Pulmonary asthma , sleep apnea and Continuous Positive Airway Pressure Ventilation , former smoker,    Pulmonary exam normal breath sounds clear to auscultation       Cardiovascular hypertension, Normal cardiovascular exam Rhythm:Regular Rate:Normal     Neuro/Psych    GI/Hepatic negative GI ROS, Neg liver ROS,   Endo/Other  Morbid obesity  Renal/GU      Musculoskeletal  (+) Arthritis , Chronic Low back pain   Abdominal (+) + obese (BMI 55.71),   Peds  Hematology   Anesthesia Other Findings All PCN  Reproductive/Obstetrics                            Anesthesia Physical Anesthesia Plan  ASA: 3  Anesthesia Plan: MAC   Post-op Pain Management:    Induction:   PONV Risk Score and Plan: Treatment may vary due to age or medical condition  Airway Management Planned: Natural Airway and Nasal Cannula  Additional Equipment: None  Intra-op Plan:   Post-operative Plan:   Informed Consent: I have reviewed the patients History and Physical, chart, labs and discussed the procedure including the risks, benefits and alternatives for the proposed anesthesia with the patient or authorized representative who has indicated his/her understanding and acceptance.     Dental advisory given  Plan Discussed with: CRNA and Anesthesiologist  Anesthesia Plan Comments: (Screening Colonoscopy )       Anesthesia Quick Evaluation

## 2021-02-23 ENCOUNTER — Ambulatory Visit (HOSPITAL_COMMUNITY): Payer: Medicaid Other | Admitting: Anesthesiology

## 2021-02-23 ENCOUNTER — Other Ambulatory Visit: Payer: Self-pay

## 2021-02-23 ENCOUNTER — Ambulatory Visit (HOSPITAL_COMMUNITY)
Admission: RE | Admit: 2021-02-23 | Discharge: 2021-02-23 | Disposition: A | Discharge status: Home / Self Care | Payer: Medicaid Other | Attending: Gastroenterology | Admitting: Gastroenterology

## 2021-02-23 ENCOUNTER — Encounter (HOSPITAL_COMMUNITY)
Admission: RE | Disposition: A | Discharge status: Home / Self Care | Payer: Self-pay | Source: Home / Self Care | Attending: Gastroenterology

## 2021-02-23 ENCOUNTER — Encounter (HOSPITAL_COMMUNITY): Payer: Self-pay | Admitting: Gastroenterology

## 2021-02-23 DIAGNOSIS — Z6841 Body Mass Index (BMI) 40.0 and over, adult: Secondary | ICD-10-CM | POA: Insufficient documentation

## 2021-02-23 DIAGNOSIS — Z1212 Encounter for screening for malignant neoplasm of rectum: Secondary | ICD-10-CM

## 2021-02-23 DIAGNOSIS — K644 Residual hemorrhoidal skin tags: Secondary | ICD-10-CM | POA: Insufficient documentation

## 2021-02-23 DIAGNOSIS — G4733 Obstructive sleep apnea (adult) (pediatric): Secondary | ICD-10-CM | POA: Diagnosis not present

## 2021-02-23 DIAGNOSIS — D125 Benign neoplasm of sigmoid colon: Secondary | ICD-10-CM | POA: Diagnosis not present

## 2021-02-23 DIAGNOSIS — Z9989 Dependence on other enabling machines and devices: Secondary | ICD-10-CM | POA: Diagnosis not present

## 2021-02-23 DIAGNOSIS — D122 Benign neoplasm of ascending colon: Secondary | ICD-10-CM | POA: Diagnosis not present

## 2021-02-23 DIAGNOSIS — Z87891 Personal history of nicotine dependence: Secondary | ICD-10-CM | POA: Insufficient documentation

## 2021-02-23 DIAGNOSIS — Z800 Family history of malignant neoplasm of digestive organs: Secondary | ICD-10-CM | POA: Diagnosis not present

## 2021-02-23 DIAGNOSIS — Z1211 Encounter for screening for malignant neoplasm of colon: Secondary | ICD-10-CM

## 2021-02-23 DIAGNOSIS — K642 Third degree hemorrhoids: Secondary | ICD-10-CM

## 2021-02-23 DIAGNOSIS — K635 Polyp of colon: Secondary | ICD-10-CM | POA: Diagnosis not present

## 2021-02-23 DIAGNOSIS — Z833 Family history of diabetes mellitus: Secondary | ICD-10-CM | POA: Diagnosis not present

## 2021-02-23 HISTORY — PX: POLYPECTOMY: SHX5525

## 2021-02-23 HISTORY — PX: COLONOSCOPY WITH PROPOFOL: SHX5780

## 2021-02-23 SURGERY — COLONOSCOPY WITH PROPOFOL
Anesthesia: Monitor Anesthesia Care

## 2021-02-23 MED ORDER — PROPOFOL 500 MG/50ML IV EMUL
INTRAVENOUS | Status: DC | PRN
  Administered 2021-02-23: 150 ug/kg/min via INTRAVENOUS

## 2021-02-23 MED ORDER — PROPOFOL 10 MG/ML IV BOLUS
INTRAVENOUS | Status: AC
  Filled 2021-02-23: qty 20

## 2021-02-23 MED ORDER — SODIUM CHLORIDE 0.9 % IV SOLN: INTRAVENOUS | Status: DC

## 2021-02-23 MED ORDER — PROPOFOL 500 MG/50ML IV EMUL
INTRAVENOUS | Status: AC
  Filled 2021-02-23: qty 50

## 2021-02-23 MED ORDER — LACTATED RINGERS IV SOLN
INTRAVENOUS | Status: DC
  Administered 2021-02-23: 1000 mL via INTRAVENOUS

## 2021-02-23 MED ORDER — DEXMEDETOMIDINE (PRECEDEX) IN NS 20 MCG/5ML (4 MCG/ML) IV SYRINGE
PREFILLED_SYRINGE | INTRAVENOUS | Status: DC | PRN
  Administered 2021-02-23 (×2): 8 ug via INTRAVENOUS

## 2021-02-23 MED ORDER — LIDOCAINE HCL (CARDIAC) PF 100 MG/5ML IV SOSY
PREFILLED_SYRINGE | INTRAVENOUS | Status: DC | PRN
  Administered 2021-02-23: 100 mg via INTRAVENOUS

## 2021-02-23 SURGICAL SUPPLY — 22 items

## 2021-02-23 NOTE — Interval H&P Note (Signed)
History and Physical Interval Note:  02/23/2021 7:22 AM  Reginald Campbell  has presented today for surgery, with the diagnosis of Colorectal cancer screening, BMI over 50.  The various methods of treatment have been discussed with the patient and family. After consideration of risks, benefits and other options for treatment, the patient has consented to  Procedure(s): COLONOSCOPY WITH PROPOFOL (N/A) as a surgical intervention.  The patient's history has been reviewed, patient examined, no change in status, stable for surgery.  I have reviewed the patient's chart and labs.  Questions were answered to the patient's satisfaction.     Dominic Pea Zayna Toste

## 2021-02-23 NOTE — Anesthesia Postprocedure Evaluation (Signed)
Anesthesia Post Note  Patient: Reginald Campbell  Procedure(s) Performed: COLONOSCOPY WITH PROPOFOL POLYPECTOMY     Patient location during evaluation: Endoscopy Anesthesia Type: MAC Level of consciousness: awake and alert Pain management: pain level controlled Vital Signs Assessment: post-procedure vital signs reviewed and stable Respiratory status: spontaneous breathing, nonlabored ventilation, respiratory function stable and patient connected to nasal cannula oxygen Cardiovascular status: blood pressure returned to baseline and stable Postop Assessment: no apparent nausea or vomiting Anesthetic complications: no   No notable events documented.  Last Vitals:  Vitals:   02/23/21 0820 02/23/21 0830  BP: 124/83 129/63  Pulse: 79 77  Resp: 12 15  Temp:    SpO2: 95% 96%    Last Pain:  Vitals:   02/23/21 0830  TempSrc:   PainSc: 0-No pain                 Barnet Glasgow

## 2021-02-23 NOTE — Addendum Note (Signed)
Addendum  created 02/23/21 1510 by Lind Covert, CRNA   Clinical Note Signed

## 2021-02-23 NOTE — H&P (Signed)
    Chief Complaint:    Colon cancer screening  HPI:     Patient is a 45 y.o. male presenting to [facility] long Endoscopy [facility] for colonoscopy for routine CRC screening.  Initially seen in the office on 01/03/2021.  No changes in medical history since that appointment.  No prior colonoscopy.  Father with EtOH cirrhosis and HCC.  Otherwise no known family history of colon cancer, IBD.   Review of systems:     No chest pain, no SOB, no fevers, no urinary sx   Past Medical History:  Diagnosis Date   Arthritis    Asthma    Back pain    BRBPR (bright red blood per rectum) [id number]   Complication of circumcision 01/23/2017   Depression    Family history of adverse reaction to anesthesia    daughter under anesthesia for 6 hours had problems with N/V    Family history of diabetes mellitus 04/07/2015   History of kidney stones    Hypertension    hx of elevated bp, no meds for htn   Lumbar radiculopathy, chronic 10/01/2016   Nephrolithiasis 04/18/2015   Phimosis    Rectal bleeding 03/07/2017   Sleep apnea    does not use cpap due to broke    Patient's surgical history, family medical history, social history, medications and allergies were all reviewed in Epic    Current Facility-Administered Medications  Medication Dose Route Frequency Provider Last Rate Last Admin   0.9 %  sodium chloride infusion   Intravenous Continuous Murriel Holwerda V, DO       lactated ringers infusion   Intravenous Continuous Breanda Greenlaw V, DO 10 mL/hr at 02/23/21 0640 1,000 mL at 02/23/21 0640    Physical Exam:     BP (!) 148/87   Pulse 84   Temp 97.9 F (36.6 C) (Oral)   Resp 19   Ht '5\' 10"'$  (1.778 m)   Wt (!) 172.4 kg   SpO2 98%   BMI 54.52 kg/m   GENERAL:  Pleasant male in NAD PSYCH: : Cooperative, normal affect EENT:  conjunctiva pink, mucous membranes moist, neck supple without masses CARDIAC:  RRR, no murmur heard, no peripheral edema PULM: Normal respiratory effort, lungs CTA  bilaterally, no wheezing ABDOMEN:  Nondistended, soft, nontender. No obvious masses, no hepatomegaly,  normal bowel sounds SKIN:  turgor, no lesions seen Musculoskeletal:  Normal muscle tone, normal strength NEURO: Alert and oriented x 3, no focal neurologic deficits   IMPRESSION and PLAN:    1) Colon cancer screening 2) Obesity (BMI 55) 3) OSA - Colonoscopy today at [facility] due to elevated periprocedural risks          Lavena Bullion ,DO, FACG 02/23/2021, 7:19 AM

## 2021-02-23 NOTE — Anesthesia Procedure Notes (Signed)
Procedure Name: MAC Date/Time: 02/23/2021 7:32 AM Performed by: Lieutenant Diego, CRNA Pre-anesthesia Checklist: Patient identified, Emergency Drugs available, Suction available, Patient being monitored and Timeout performed Patient Re-evaluated:Patient Re-evaluated prior to induction Oxygen Delivery Method: Simple face mask Preoxygenation: Pre-oxygenation with 100% oxygen Induction Type: IV induction

## 2021-02-23 NOTE — Discharge Instructions (Signed)

## 2021-02-23 NOTE — Transfer of Care (Addendum)
Immediate Anesthesia Transfer of Care Note  Patient: Reginald Campbell  Procedure(s) Performed: COLONOSCOPY WITH PROPOFOL POLYPECTOMY  Patient Location: Endoscopy Unit  Anesthesia Type:MAC  Level of Consciousness: awake  Airway & Oxygen Therapy: Patient Spontanous Breathing and Patient connected to face mask oxygen  Post-op Assessment: Report given to RN and Post -op Vital signs reviewed and stable  Post vital signs: Reviewed and stable  Last Vitals:  Vitals Value Taken Time  BP    Temp    Pulse    Resp    SpO2      Last Pain:  Vitals:   02/23/21 0628  TempSrc: Oral  PainSc: 0-No pain         Complications: No notable events documented.

## 2021-02-23 NOTE — Op Note (Signed)
[facility] Patient Name: Reginald Campbell Procedure Date: 02/23/2021 MRN: [id number] Attending MD: Gerrit Heck , MD Date of Birth: 1975/11/29 CSN: [id number] Age: 45 Admit Type: Outpatient Procedure:                Colonoscopy Indications:              Screening for colorectal malignant neoplasm, This                            is the patient's first colonoscopy Providers:                Gerrit Heck, MD, Kary Kos RN, RN, Terrall Laity, Tyna Jaksch Technician Referring MD:              Medicines:                Monitored Anesthesia Care Complications:            No immediate complications. Estimated Blood Loss:     Estimated blood loss was minimal. Procedure:                Pre-Anesthesia Assessment:                           - Prior to the procedure, a History and Physical                            was performed, and patient medications and                            allergies were reviewed. The patient's tolerance of                            previous anesthesia was also reviewed. The risks                            and benefits of the procedure and the sedation                            options and risks were discussed with the patient.                            All questions were answered, and informed consent                            was obtained. Prior Anticoagulants: The patient has                            taken no previous anticoagulant or antiplatelet                            agents. ASA Grade Assessment: III - A patient with  severe systemic disease. After reviewing the risks                            and benefits, the patient was deemed in                            satisfactory condition to undergo the procedure.                           After obtaining informed consent, the colonoscope                            was passed under direct vision. Throughout the                             procedure, the patient's blood pressure, pulse, and                            oxygen saturations were monitored continuously. The                            [id number] Olympus colonoscope was                            introduced through the anus and advanced to the the                            cecum, identified by appendiceal orifice and                            ileocecal valve. The colonoscopy was technically                            difficult and complex due to significant looping.                            The patient tolerated the procedure well. The                            quality of the bowel preparation was good. The                            ileocecal valve, appendiceal orifice, and rectum                            were photographed. Scope In: 7:34:34 AM Scope Out: 8:05:52 AM Scope Withdrawal Time: 0 hours 21 minutes 53 seconds  Total Procedure Duration: 0 hours 31 minutes 18 seconds  Findings:      Hemorrhoids were found on perianal exam.      Two sessile polyps were found in the ascending colon. The polyps were 5       to 8 mm in size. These polyps were removed with a cold snare. Resection       and retrieval were complete. Estimated blood loss was minimal.  A 4 mm polyp was found in the distal sigmoid colon. The polyp was       sessile. The polyp was removed with a cold snare. Resection and       retrieval were complete. Estimated blood loss was minimal.      Non-bleeding internal hemorrhoids were found during retroflexion. The       hemorrhoids were medium-sized and Grade III (internal hemorrhoids that       prolapse but require manual reduction).      The ascending colon revealed significantly excessive looping. Advancing       the scope required using manual pressure. Impression:               - Hemorrhoids found on perianal exam.                           - Two 5 to 8 mm polyps in the ascending colon,                            removed  with a cold snare. Resected and retrieved.                           - One 4 mm polyp in the distal sigmoid colon,                            removed with a cold snare. Resected and retrieved.                           - Non-bleeding internal hemorrhoids.                           - There was significant looping of the colon. Moderate Sedation:      Not Applicable - Patient had care per Anesthesia. Recommendation:           - Patient has a contact number available for                            emergencies. The signs and symptoms of potential                            delayed complications were discussed with the                            patient. Return to normal activities tomorrow.                            Written discharge instructions were provided to the                            patient.                           - Resume previous diet.                           - Continue present medications.                           -  Await pathology results.                           - Repeat colonoscopy for surveillance based on                            pathology results.                           - Return to GI office PRN.                           - Use fiber, for example Citrucel, Fibercon, Konsyl                            or Metamucil. Procedure Code(s):        --- Professional ---                           [phone], Colonoscopy, flexible; with removal of                            tumor(s), polyp(s), or other lesion(s) by snare                            technique Diagnosis Code(s):        --- Professional ---                           K63.5, Polyp of colon                           Z12.11, Encounter for screening for malignant                            neoplasm of colon                           K64.2, Third degree hemorrhoids CPT copyright 2019 [employer]. All rights reserved. The codes documented in this report are preliminary and upon coder review may  be  revised to meet current compliance requirements. Gerrit Heck, MD 02/23/2021 8:12:13 AM Number of Addenda: 0

## 2021-02-24 ENCOUNTER — Encounter (HOSPITAL_COMMUNITY): Payer: Self-pay | Admitting: Gastroenterology

## 2021-02-24 LAB — SURGICAL PATHOLOGY

## 2021-02-28 ENCOUNTER — Encounter: Payer: Self-pay | Admitting: Gastroenterology

## 2021-05-06 ENCOUNTER — Emergency Department (HOSPITAL_COMMUNITY)
Admission: EM | Admit: 2021-05-06 | Discharge: 2021-05-06 | Discharge status: Against Medical Advice | Payer: Medicaid Other | Attending: Emergency Medicine | Admitting: Emergency Medicine

## 2021-05-06 ENCOUNTER — Emergency Department (HOSPITAL_COMMUNITY): Payer: Medicaid Other

## 2021-05-06 ENCOUNTER — Other Ambulatory Visit: Payer: Self-pay

## 2021-05-06 DIAGNOSIS — R059 Cough, unspecified: Secondary | ICD-10-CM | POA: Insufficient documentation

## 2021-05-06 DIAGNOSIS — Z5321 Procedure and treatment not carried out due to patient leaving prior to being seen by health care provider: Secondary | ICD-10-CM | POA: Insufficient documentation

## 2021-05-06 DIAGNOSIS — R0981 Nasal congestion: Secondary | ICD-10-CM | POA: Diagnosis not present

## 2021-05-06 DIAGNOSIS — Z20822 Contact with and (suspected) exposure to covid-19: Secondary | ICD-10-CM | POA: Diagnosis not present

## 2021-05-06 DIAGNOSIS — R079 Chest pain, unspecified: Secondary | ICD-10-CM | POA: Insufficient documentation

## 2021-05-06 DIAGNOSIS — J3489 Other specified disorders of nose and nasal sinuses: Secondary | ICD-10-CM | POA: Insufficient documentation

## 2021-05-06 LAB — RESP PANEL BY RT-PCR (FLU A&B, COVID) ARPGX2
Influenza A by PCR: NEGATIVE
Influenza B by PCR: NEGATIVE
SARS Coronavirus 2 by RT PCR: NEGATIVE

## 2021-05-06 NOTE — ED Provider Notes (Signed)
Emergency Medicine Provider Triage Evaluation Note  Reginald Campbell , a 45 y.o. male  was evaluated in triage.  Pt complains of upper respiratory symptoms.  Patient states over the last 2 days he has had cough, congestion, rhinorrhea, chest pain with coughing.  He has no exertional or pleuritic chest pain.  He is coughing up clear sputum.  He has a runny nose which is consistent of clear rhinorrhea.  Some mild tenderness to his frontal sinuses.  No back pain, shortness of breath, lower extremity swelling.    Review of Systems  Positive: Cough, congestion, rhinorrhea, chest pain with cough Negative: Fever, emesis, back pain, abdominal pain, lower extremity  Physical Exam  Ht 5\' 11"  (1.803 m)   Wt (!) 149.7 kg   BMI 46.03 kg/m  Gen:   Awake, no distress   Head:  Tenderness over Bl max sinuses. Clear rhinorrhea Resp:  Normal effort, speaks in full sentences without difficulty MSK:   Moves extremities without difficulty  Other:    Medical Decision Making  Medically screening exam initiated at 8:15 PM.  Appropriate orders placed.  Reginald Campbell was informed that the remainder of the evaluation will be completed by another provider, this initial triage assessment does not replace that evaluation, and the importance of remaining in the ED until their evaluation is complete.  UR sx  VS stable.    Kaysen Sefcik A, PA-C 05/06/21 2017    Daleen Bo, MD 05/07/21 [phone]

## 2021-05-06 NOTE — ED Notes (Signed)
Pt left due to feeling better

## 2021-05-06 NOTE — ED Triage Notes (Signed)
Pt c/o chest congestion with productive cough and chest ache started 2 days ago.

## 2021-05-08 ENCOUNTER — Telehealth: Payer: Self-pay

## 2021-05-08 NOTE — Telephone Encounter (Signed)
Transition Care Management Unsuccessful Follow-up Telephone Call  Date of discharge and from where:  05/06/2021[facility]   Attempts:  1st Attempt  Reason for unsuccessful TCM follow-up call:  Left voice message

## 2021-05-09 NOTE — Telephone Encounter (Signed)
Transition Care Management Follow-up Telephone Call Date of discharge and from where: 05/06/2021 from [facility] ED How have you been since you were released from the hospital? Pt eloped after presenting to [facility] ED. Pt stated that he is feeling a lot better today. Pt stated that he does not have any questions or concerns at this time.  Any questions or concerns? No

## 2021-11-04 ENCOUNTER — Other Ambulatory Visit: Payer: Self-pay

## 2021-11-04 ENCOUNTER — Encounter (HOSPITAL_COMMUNITY): Payer: Self-pay

## 2021-11-04 ENCOUNTER — Emergency Department (HOSPITAL_COMMUNITY)
Admission: EM | Admit: 2021-11-04 | Discharge: 2021-11-04 | Disposition: A | Discharge status: Home / Self Care | Payer: Medicaid Other | Attending: Emergency Medicine | Admitting: Emergency Medicine

## 2021-11-04 DIAGNOSIS — Z79899 Other long term (current) drug therapy: Secondary | ICD-10-CM | POA: Diagnosis not present

## 2021-11-04 DIAGNOSIS — N23 Unspecified renal colic: Secondary | ICD-10-CM | POA: Insufficient documentation

## 2021-11-04 DIAGNOSIS — I10 Essential (primary) hypertension: Secondary | ICD-10-CM | POA: Insufficient documentation

## 2021-11-04 DIAGNOSIS — J45909 Unspecified asthma, uncomplicated: Secondary | ICD-10-CM | POA: Diagnosis not present

## 2021-11-04 DIAGNOSIS — R1032 Left lower quadrant pain: Secondary | ICD-10-CM | POA: Diagnosis present

## 2021-11-04 DIAGNOSIS — R109 Unspecified abdominal pain: Secondary | ICD-10-CM

## 2021-11-04 LAB — URINALYSIS, ROUTINE W REFLEX MICROSCOPIC
Bacteria, UA: NONE SEEN
Bilirubin Urine: NEGATIVE
Glucose, UA: NEGATIVE mg/dL
Ketones, ur: NEGATIVE mg/dL
Nitrite: NEGATIVE
Protein, ur: NEGATIVE mg/dL
Specific Gravity, Urine: 1.025 (ref 1.005–1.030)
pH: 5 (ref 5.0–8.0)

## 2021-11-04 LAB — CBC
HCT: 42.4 % (ref 39.0–52.0)
Hemoglobin: 13.8 g/dL (ref 13.0–17.0)
MCH: 28.9 pg (ref 26.0–34.0)
MCHC: 32.5 g/dL (ref 30.0–36.0)
MCV: 88.7 fL (ref 80.0–100.0)
Platelets: 192 10*3/uL (ref 150–400)
RBC: 4.78 MIL/uL (ref 4.22–5.81)
RDW: 14.2 % (ref 11.5–15.5)
WBC: 11.2 10*3/uL — ABNORMAL HIGH (ref 4.0–10.5)
nRBC: 0 % (ref 0.0–0.2)

## 2021-11-04 LAB — BASIC METABOLIC PANEL
Anion gap: 7 (ref 5–15)
BUN: 19 mg/dL (ref 6–20)
CO2: 26 mmol/L (ref 22–32)
Calcium: 8.9 mg/dL (ref 8.9–10.3)
Chloride: 108 mmol/L (ref 98–111)
Creatinine, Ser: 1.11 mg/dL (ref 0.61–1.24)
GFR, Estimated: >60 mL/min (ref >60)
Glucose, Bld: 158 mg/dL — ABNORMAL HIGH (ref 70–99)
Potassium: 3.4 mmol/L — ABNORMAL LOW (ref 3.5–5.1)
Sodium: 141 mmol/L (ref 135–145)

## 2021-11-04 MED ORDER — KETOROLAC TROMETHAMINE 15 MG/ML IJ SOLN
15.0000 mg | Freq: Once | INTRAMUSCULAR | Status: AC
  Administered 2021-11-04: 15 mg via INTRAVENOUS
  Filled 2021-11-04: qty 1

## 2021-11-04 MED ORDER — TAMSULOSIN HCL 0.4 MG PO CAPS
0.4000 mg | ORAL_CAPSULE | Freq: Once | ORAL | Status: AC
  Administered 2021-11-04: 0.4 mg via ORAL
  Filled 2021-11-04: qty 1

## 2021-11-04 MED ORDER — IBUPROFEN 600 MG PO TABS: 600.0000 mg | ORAL_TABLET | Freq: Four times a day (QID) | ORAL | 0 refills | Status: DC | PRN

## 2021-11-04 MED ORDER — TAMSULOSIN HCL 0.4 MG PO CAPS: 0.4000 mg | ORAL_CAPSULE | Freq: Every day | ORAL | 0 refills | Status: AC

## 2021-11-04 MED ORDER — ONDANSETRON 4 MG PO TBDP: 4.0000 mg | ORAL_TABLET | Freq: Three times a day (TID) | ORAL | 0 refills | Status: AC | PRN

## 2021-11-04 MED ORDER — ONDANSETRON HCL 4 MG/2ML IJ SOLN
4.0000 mg | Freq: Once | INTRAMUSCULAR | Status: AC
  Administered 2021-11-04: 4 mg via INTRAVENOUS
  Filled 2021-11-04: qty 2

## 2021-11-04 MED ORDER — OXYCODONE HCL 5 MG PO TABS: 2.5000 mg | ORAL_TABLET | Freq: Four times a day (QID) | ORAL | 0 refills | Status: AC | PRN

## 2021-11-04 MED ORDER — SODIUM CHLORIDE 0.9 % IV BOLUS
1000.0000 mL | Freq: Once | INTRAVENOUS | Status: AC
  Administered 2021-11-04: 1000 mL via INTRAVENOUS

## 2021-11-04 NOTE — ED Triage Notes (Signed)
Pt reports with mid left [place] back pain that started 40 mins ago.  ?

## 2021-11-04 NOTE — ED Provider Notes (Signed)
?[facility] DEPT ?Provider Note ? ?CSN: [id number] ?Arrival date & time: 11/04/21 0105 ? ?Chief Complaint(s) ?Back Pain ? ?HPI ?Reginald Campbell is a 46 y.o. male with past medical history listed below including prior renal stones who presents to the emergency department with left-sided flank pain.  Sudden onset approximately 1 hour prior to arrival.  Fluctuating in intensity.  No alleviating or aggravating factors.  Associated with nausea and of nonbloody nonbilious emesis.  Pain radiates from flank down to the groin.  No dysuria.  No chest pain or shortness of breath.  ? ?The history is provided by the patient.  ? ?Past Medical History ?Past Medical History:  ?Diagnosis Date  ? Arthritis   ? Asthma   ? Back pain   ? BRBPR (bright red blood per rectum) 04/07/2015  ? Complication of circumcision 01/23/2017  ? Depression   ? Family history of adverse reaction to anesthesia   ? daughter under anesthesia for 6 hours had problems with N/V   ? Family history of diabetes mellitus 04/07/2015  ? History of kidney stones   ? Hypertension   ? hx of elevated bp, no meds for htn  ? Lumbar radiculopathy, chronic 10/01/2016  ? Nephrolithiasis 04/18/2015  ? Phimosis   ? Rectal bleeding 03/07/2017  ? Sleep apnea   ? does not use cpap due to broke  ? ?Patient Active Problem List  ? Diagnosis Date Noted  ? Adenomatous polyp of ascending colon   ? Polyp of sigmoid colon   ? Grade III internal hemorrhoids   ? Screening for colorectal cancer 08/31/2020  ? Achilles tendonitis, bilateral 05/20/2020  ? Atypical mole 03/12/2019  ? HTN (hypertension) 10/22/2018  ? Chronic bilateral low back pain with bilateral sciatica 04/09/2018  ? Internal hemorrhoids 03/08/2017  ? Morbid obesity ([place]) 10/02/2016  ? MDD (major depressive disorder) 10/01/2016  ? Bilateral shoulder pain 08/18/2015  ? Healthcare maintenance 04/07/2015  ? Obstructive sleep apnea 04/07/2015  ? ?Home Medication(s) ?Prior to Admission medications   ?Medication Sig  Start Date End Date Taking? Authorizing Provider  ?ibuprofen (ADVIL) 600 MG tablet Take 1 tablet (600 mg total) by mouth every 6 (six) hours as needed. 11/04/21  Yes Jailynne Opperman, Grayce Sessions, MD  ?ondansetron (ZOFRAN-ODT) 4 MG disintegrating tablet Take 1 tablet (4 mg total) by mouth every 8 (eight) hours as needed for up to 3 days for nausea or vomiting. 11/04/21 11/07/21 Yes Steffi Noviello, Grayce Sessions, MD  ?oxyCODONE (ROXICODONE) 5 MG immediate release tablet Take 0.5-1 tablets (2.5-5 mg total) by mouth every 6 (six) hours as needed for up to 5 days for severe pain or breakthrough pain. 11/04/21 11/09/21 Yes Anairis Knick, Grayce Sessions, MD  ?tamsulosin (FLOMAX) 0.4 MG CAPS capsule Take 1 capsule (0.4 mg total) by mouth daily for 5 days. 11/04/21 11/09/21 Yes Elleana Stillson, Grayce Sessions, MD  ?                                                                                                                                  ?  Allergies ?Penicillins ? ?Review of Systems ?Review of Systems ?As noted in HPI ? ?Physical Exam ?Vital Signs  ?I have reviewed the triage vital signs ?BP (!) 174/103 (BP Location: Right Arm)   Pulse 75   Temp 97.6 ?F (36.4 ?C) (Oral)   Resp 20   Ht '5\' 10"'$  (1.778 m)   Wt (!) 165.6 kg   SpO2 97%   BMI 52.37 kg/m?  ? ?Physical Exam ?Vitals reviewed.  ?Constitutional:   ?   General: He is not in acute distress. ?   Appearance: He is well-developed. He is obese. He is not diaphoretic.  ?HENT:  ?   Head: Normocephalic and atraumatic.  ?   Right Ear: External ear normal.  ?   Left Ear: External ear normal.  ?   Nose: Nose normal.  ?   Mouth/Throat:  ?   Mouth: Mucous membranes are moist.  ?Eyes:  ?   General: No scleral icterus. ?   Conjunctiva/sclera: Conjunctivae normal.  ?Neck:  ?   Trachea: Phonation normal.  ?Cardiovascular:  ?   Rate and Rhythm: Normal rate and regular rhythm.  ?Pulmonary:  ?   Effort: Pulmonary effort is normal. No respiratory distress.  ?   Breath sounds: No stridor.  ?Abdominal:  ?   General: There  is no distension.  ?   Tenderness: There is no abdominal tenderness. There is no right CVA tenderness, left CVA tenderness, guarding or rebound.  ?Musculoskeletal:     ?   General: Normal range of motion.  ?   Cervical back: Normal range of motion.  ?Neurological:  ?   Mental Status: He is alert and oriented to person, place, and time.  ?Psychiatric:     ?   Behavior: Behavior normal.  ? ? ?ED Results and Treatments ?Labs ?(all labs ordered are listed, but only abnormal results are displayed) ?Labs Reviewed  ?URINALYSIS, ROUTINE W REFLEX MICROSCOPIC - Abnormal; Notable for the following components:  ?    Result Value  ? APPearance HAZY (*)   ? Hgb urine dipstick MODERATE (*)   ? Leukocytes,Ua MODERATE (*)   ? All other components within normal limits  ?BASIC METABOLIC PANEL - Abnormal; Notable for the following components:  ? Potassium 3.4 (*)   ? Glucose, Bld 158 (*)   ? All other components within normal limits  ?CBC - Abnormal; Notable for the following components:  ? WBC 11.2 (*)   ? All other components within normal limits  ?                                                                                                                       ?EKG ? EKG Interpretation ? ?Date/Time:    ?Ventricular Rate:    ?PR Interval:    ?QRS Duration:   ?QT Interval:    ?QTC Calculation:   ?R Axis:     ?Text Interpretation:   ?  ? ?  ? ?Radiology ?No results found. ? ?  Pertinent labs & imaging results that were available during my care of the patient were reviewed by me and considered in my medical decision making (see MDM for details). ? ?Medications Ordered in ED ?Medications  ?ketorolac (TORADOL) 15 MG/ML injection 15 mg (15 mg Intravenous Given 11/04/21 0233)  ?ondansetron [facility]) injection 4 mg (4 mg Intravenous Given 11/04/21 0231)  ?sodium chloride 0.9 % bolus 1,000 mL (0 mLs Intravenous Stopped 11/04/21 0429)  ?tamsulosin (FLOMAX) capsule 0.4 mg (0.4 mg Oral Given 11/04/21 0323)  ?ketorolac (TORADOL) 15 MG/ML injection 15 mg  (15 mg Intravenous Given 11/04/21 0321)  ?                                                               ?                                                                    ?Procedures ?Procedures ? ?(including critical care time) ? ?Medical Decision Making / ED Course ? ? ? Complexity of Problem: ? ?Co-morbidities/SDOH that complicate the patient evaluation/care: ?Noted above ? ?Additional history obtained: ?Prior CT scans notable for bilateral renal stones ? ?Patient's presenting problem/concern, DDX, and MDM listed below: ?Left flank pain ?Most consistent with renal colic.  We will get screening labs to assess for any renal insufficiency.  We will get urine to assess for hematuria or evidence of infection. ?If work-up is not consistent with renal stones, will expand work-up and expand differential. ? ?Hospitalization Considered:  ?Yes if pain not controlled ? ?Initial Intervention:  ?IV fluids, IV Zofran and IV Toradol ? ?  Complexity of Data: ?  ?Cardiac Monitoring: ?N/A ? ?Laboratory Tests ordered listed below with my independent interpretation: ?CBC with mild leukocytosis.  No anemia.   ?No significant electrolyte derangements or renal sufficiency. ?UA with hematuria.  Not concerning with urinary tract infection. ?  ?Imaging Studies ordered listed below with my independent interpretation: ?Considered CT stone study but deferred for the moment ?  ?  ?ED Course:   ? ?Assessment, Add'l Intervention, and Reassessment: ?Left flank pain ?Presentation and work-up is consistent with renal colic  ?Doubt AAA, spinal process, intra-abdominal inflammatory/infectious process. ?Pain controlled with 2 doses of Toradol and an oral dose of Flomax ?No superimposed infection ?Stable for DC ? ? ?Final Clinical Impression(s) / ED Diagnoses ?Final diagnoses:  ?Flank pain  ?Renal colic on left side  ? ?The patient appears reasonably screened and/or stabilized for discharge and I doubt any other medical condition or other [facility]  requiring further screening, evaluation, or treatment in the ED at this time prior to discharge. Safe for discharge with strict return precautions. ? ?Disposition: Discharge ? ?Condition: Good ? ?I have discussed

## 2021-11-06 ENCOUNTER — Telehealth: Payer: Self-pay

## 2021-11-06 NOTE — Telephone Encounter (Signed)
Transition Care Management Unsuccessful Follow-up Telephone Call ? ?Date of discharge and from where:  11/04/2021-[place]  ? ?Attempts:  1st Attempt ? ?Reason for unsuccessful TCM follow-up call:  Left voice message ? ?  ?

## 2021-11-07 NOTE — Telephone Encounter (Signed)
Transition Care Management Unsuccessful Follow-up Telephone Call ? ?Date of discharge and from where:  11/04/2021-[place]  ? ?Attempts:  2nd Attempt ? ?Reason for unsuccessful TCM follow-up call:  Left voice message ? ?  ?

## 2021-11-08 NOTE — Telephone Encounter (Signed)
Transition Care Management Unsuccessful Follow-up Telephone Call ? ?Date of discharge and from where:  11/04/2021-[place]  ? ?Attempts:  3rd Attempt ? ?Reason for unsuccessful TCM follow-up call:  Left voice message ? ?  ?

## 2022-02-16 ENCOUNTER — Emergency Department (HOSPITAL_COMMUNITY)
Admission: EM | Admit: 2022-02-16 | Discharge: 2022-02-16 | Disposition: A | Discharge status: Home / Self Care | Payer: Medicaid Other | Attending: Emergency Medicine | Admitting: Emergency Medicine

## 2022-02-16 ENCOUNTER — Other Ambulatory Visit: Payer: Self-pay

## 2022-02-16 ENCOUNTER — Encounter (HOSPITAL_COMMUNITY): Payer: Self-pay

## 2022-02-16 DIAGNOSIS — J45909 Unspecified asthma, uncomplicated: Secondary | ICD-10-CM | POA: Diagnosis not present

## 2022-02-16 DIAGNOSIS — X58XXXA Exposure to other specified factors, initial encounter: Secondary | ICD-10-CM | POA: Insufficient documentation

## 2022-02-16 DIAGNOSIS — I10 Essential (primary) hypertension: Secondary | ICD-10-CM | POA: Insufficient documentation

## 2022-02-16 DIAGNOSIS — S3992XA Unspecified injury of lower back, initial encounter: Secondary | ICD-10-CM | POA: Diagnosis present

## 2022-02-16 DIAGNOSIS — S39012A Strain of muscle, fascia and tendon of lower back, initial encounter: Secondary | ICD-10-CM | POA: Insufficient documentation

## 2022-02-16 MED ORDER — KETOROLAC TROMETHAMINE 15 MG/ML IJ SOLN
15.0000 mg | Freq: Once | INTRAMUSCULAR | Status: AC
  Administered 2022-02-16: 15 mg via INTRAMUSCULAR
  Filled 2022-02-16: qty 1

## 2022-02-16 MED ORDER — METHOCARBAMOL 500 MG PO TABS: 500.0000 mg | ORAL_TABLET | Freq: Two times a day (BID) | ORAL | 0 refills | Status: DC

## 2022-02-16 MED ORDER — METHYLPREDNISOLONE ACETATE 40 MG/ML IJ SUSP
40.0000 mg | Freq: Once | INTRAMUSCULAR | Status: AC
  Administered 2022-02-16: 40 mg via INTRAMUSCULAR
  Filled 2022-02-16: qty 1

## 2022-02-16 MED ORDER — MELOXICAM 15 MG PO TABS: 15.0000 mg | ORAL_TABLET | Freq: Every day | ORAL | 0 refills | Status: AC

## 2022-02-16 NOTE — Discharge Instructions (Signed)
Follow up with your doctor for recheck. You may benefit from referral to physical therapy. Take medications as prescribed for back pain. Warm compresses for 20 minutes followed by gentle stretching.

## 2022-02-16 NOTE — ED Provider Notes (Signed)
[facility] DEPT Provider Note   CSN: [id number] Arrival date & time: 02/16/22  1050     History  Chief Complaint  Patient presents with   Back Pain    Reginald Campbell is a 46 y.o. male.  46 year old male with past medical history of back pain, kidney stones, depression, hypertension, sleep apnea, asthma presents with complaint of left and right lower back pain which radiates into his legs as a [place] stabbing pain.  Patient states that he first noticed this yesterday morning upon waking, denies recent falls or injuries.  Pain is worse with trying to sit up straight or with walking.  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  He denies abdominal pain, fevers, changes in bowel or bladder habits, loss of bowel or bladder control, saddle paresthesias, history of IV drug abuse.  Patient tried taking leftover oxycodone yesterday without improvement in his pain.  No other complaints or concerns today.       Home Medications Prior to Admission medications   Medication Sig Start Date End Date Taking? Authorizing Provider  meloxicam (MOBIC) 15 MG tablet Take 1 tablet (15 mg total) by mouth daily for 10 days. 02/16/22 02/26/22 Yes Tacy Learn, PA-C  methocarbamol (ROBAXIN) 500 MG tablet Take 1 tablet (500 mg total) by mouth 2 (two) times daily. 02/16/22  Yes Tacy Learn, PA-C  ibuprofen (ADVIL) 600 MG tablet Take 1 tablet (600 mg total) by mouth every 6 (six) hours as needed. 11/04/21   Fatima Blank, MD      Allergies    Penicillins    Review of Systems   Review of Systems Negative except as per HPI Physical Exam Updated Vital Signs BP (!) 147/98 (BP Location: Left Arm)   Pulse 74   Temp 97.9 F (36.6 C) (Oral)   Resp 18   Ht '5\' 10"'$  (1.778 m)   Wt (!) 174.4 kg   SpO2 94%   BMI 55.16 kg/m  Physical Exam Vitals and nursing note reviewed.   Constitutional:      General: He is not in acute distress.    Appearance: He is well-developed. He is obese. He is not diaphoretic.  HENT:     Head: Normocephalic and atraumatic.  Cardiovascular:     Pulses: Normal pulses.  Pulmonary:     Effort: Pulmonary effort is normal.  Musculoskeletal:        General: Tenderness present. No swelling, deformity or signs of injury.     Thoracic back: No tenderness or bony tenderness.     Lumbar back: Tenderness present. No bony tenderness.       Back:     Right lower leg: No edema.     Left lower leg: No edema.     Comments: Right more than left paraspinous lumbar tenderness.  Right side extends to right SI.  No tenderness over left SI.  Equal leg strength including great toes.  Reflexes symmetric, sensation intact.  Skin:    General: Skin is warm and dry.     Findings: No erythema or rash.  Neurological:     Mental Status: He is alert and oriented to person, place, and time.     Sensory: No sensory deficit.     Motor: No weakness.     Deep Tendon Reflexes: Reflexes normal.  Psychiatric:  Behavior: Behavior normal.     ED Results / Procedures / Treatments   Labs (all labs ordered are listed, but only abnormal results are displayed) Labs Reviewed - No data to display  EKG None  Radiology No results found.  Procedures Procedures    Medications Ordered in ED Medications  ketorolac (TORADOL) 15 MG/ML injection 15 mg (15 mg Intramuscular Given 02/16/22 1209)  methylPREDNISolone acetate (DEPO-MEDROL) injection 40 mg (40 mg Intramuscular Given 02/16/22 1209)    ED Course/ Medical Decision Making/ A&P                           Medical Decision Making Risk Prescription drug management.   46 year old male with complaint of left and right lower back pain which is worse with palpation and movement and radiates into his legs.  He denies any precipitating injury.  No red flags.  Pain is reproduced with palpation today.  Review  of his last lumbar spine x-rays on file from November 2020 which show small anterior endplate spurs from A26-J3.  Recommend muscle relaxant and anti-inflammatory.  Patient will be given injection of Depo-Medrol and Toradol in the ER prior to discharge.  Recommend follow-up with primary care provider for likely physical therapy.  Return to ER for worsening or concerning symptoms.        Final Clinical Impression(s) / ED Diagnoses Final diagnoses:  Strain of lumbar region, initial encounter    Rx / DC Orders ED Discharge Orders          Ordered    methocarbamol (ROBAXIN) 500 MG tablet  2 times daily        02/16/22 1206    meloxicam (MOBIC) 15 MG tablet  Daily        02/16/22 1206              Roque Lias 02/16/22 1220    Charlesetta Shanks, MD 02/18/22 1233

## 2022-02-16 NOTE — ED Triage Notes (Signed)
Patient states that he has bilateral lower back pain that radiates down both legs when he walks in the pst few days.

## 2022-05-31 ENCOUNTER — Ambulatory Visit (INDEPENDENT_AMBULATORY_CARE_PROVIDER_SITE_OTHER): Payer: Medicaid Other | Admitting: Family Medicine

## 2022-05-31 ENCOUNTER — Encounter (HOSPITAL_BASED_OUTPATIENT_CLINIC_OR_DEPARTMENT_OTHER): Payer: Self-pay | Admitting: Family Medicine

## 2022-05-31 ENCOUNTER — Ambulatory Visit (INDEPENDENT_AMBULATORY_CARE_PROVIDER_SITE_OTHER): Payer: Medicaid Other

## 2022-05-31 VITALS — BP 177/75 | HR 68 | Temp 97.6°F | Ht 71.0 in | Wt 374.6 lb

## 2022-05-31 DIAGNOSIS — G4733 Obstructive sleep apnea (adult) (pediatric): Secondary | ICD-10-CM

## 2022-05-31 DIAGNOSIS — M5442 Lumbago with sciatica, left side: Secondary | ICD-10-CM

## 2022-05-31 DIAGNOSIS — M5441 Lumbago with sciatica, right side: Secondary | ICD-10-CM

## 2022-05-31 DIAGNOSIS — G8929 Other chronic pain: Secondary | ICD-10-CM | POA: Diagnosis not present

## 2022-05-31 DIAGNOSIS — M545 Low back pain, unspecified: Secondary | ICD-10-CM | POA: Diagnosis not present

## 2022-05-31 DIAGNOSIS — I10 Essential (primary) hypertension: Secondary | ICD-10-CM | POA: Diagnosis not present

## 2022-05-31 NOTE — Patient Instructions (Signed)
  Medication Instructions:  Your physician recommends that you continue on your current medications as directed. Please refer to the Current Medication list given to you today. --If you need a refill on any your medications before your next appointment, please call your pharmacy first. If no refills are authorized on file call the office.-- Lab Work: Your physician has recommended that you have lab work today: No If you have labs (blood work) drawn today and your tests are completely normal, you will receive your results via [place] a phone call from our staff.  Please ensure you check your voicemail in the event that you authorized detailed messages to be left on a delegated number. If you have any lab test that is abnormal or we need to change your treatment, we will call you to review the results.  Referrals/Procedures/Imaging: Yes, X-ray  Follow-Up: Your next appointment:   Your physician recommends that you schedule a follow-up appointment in: 6-8 weeks with Dr. de [place].  You will receive a text message or e-mail with a link to a survey about your care and experience with [place] today! We would greatly appreciate your feedback!   Thanks for letting us be apart of your health journey!!  Primary Care and Sports Medicine   Dr. Arlina Robes [place]   We encourage you to activate your patient portal called "MyChart".  Sign up information is provided on this After Visit Summary.  MyChart is used to connect with patients for Virtual Visits (Telemedicine).  Patients are able to view lab/test results, encounter notes, upcoming appointments, etc.  Non-urgent messages can be sent to your provider as well. To learn more about what you can do with MyChart, please visit --  [email or web address].

## 2022-05-31 NOTE — Assessment & Plan Note (Signed)
Patient with underlying sleep apnea, has not been adequately treated recently due to not using CPAP.  It seems there have been various issues related to fit of mask as well as appropriate electrical source where he lives.  Reviewed with patient risks related to untreated sleep apnea and importance of needing to appropriately treat underlying condition We will place referral for patient to establish with sleep medicine specialist

## 2022-05-31 NOTE — Assessment & Plan Note (Signed)
Discussed general treatment considerations.  It has been a few years since his last lumbar spine x-rays, feel it would be reasonable to update x-rays in order to assess for any interval changes.  I also feel that he would benefit from formal physical therapy, referral has been placed today in order to have this arranged.  If symptoms persist despite conservative measures, appropriate trial of physical therapy, next step would be for completion of MRI to further assess

## 2022-05-31 NOTE — Assessment & Plan Note (Signed)
Blood pressure is elevated in office today, slightly improved on recheck.  Likely we will need to proceed with pharmacotherapy in order to adequately control blood pressure and lower risk of complications related to hypertension

## 2022-05-31 NOTE — Progress Notes (Signed)
.  New Patient Office Visit  Subjective    Patient ID: Reginald Campbell, male    DOB: 22-Nov-1975  Age: 46 y.o. MRN: [id number]  CC:  Chief Complaint  Patient presents with   New Patient (Initial Visit)    Pt here to establish new care     HPI Reginald Campbell presents to establish care Last PCP - Dr. Allyson Sabal with [facility]  HTN: Diagnosed in the past, reports that he has never been on any specific medications in the past.  Does not check blood pressure at home.  Denies any current chest pain or headaches at this time.  Chronic low back pain: Has had long history of low back pain. Has had prior evaluation with PCP, most recently went to ED for evaluation. Was prescribed muscle relaxant and anti-inflammatory. Reports having one treatment visit with PT in the past and that he could not continue as insurance did not cover any additional visits through physical therapy.  He has questions about possible MRI as this reportedly was something that was being looked into by last PCP but was never able to be arranged.  Patient is originally from [place]. 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  Outpatient Encounter Medications as of 05/31/2022  Medication Sig   [DISCONTINUED] ibuprofen (ADVIL) 600 MG tablet Take 1 tablet (600 mg total) by mouth every 6 (six) hours as needed.   [DISCONTINUED] methocarbamol (ROBAXIN) 500 MG tablet Take 1 tablet (500 mg total) by mouth 2 (two) times daily.   No facility-administered encounter medications on file as of 05/31/2022.    Past Medical History:  Diagnosis Date   Arthritis    Asthma    Back pain    BRBPR (bright red blood per rectum) 01/03/3418   Complication of circumcision 01/23/2017   Depression    Family history of adverse reaction to anesthesia    daughter under anesthesia for 6 hours had problems with N/V    Family history of diabetes mellitus  04/07/2015   History of kidney stones    Hypertension    hx of elevated bp, no meds for htn   Lumbar radiculopathy, chronic 10/01/2016   Nephrolithiasis 04/18/2015   Phimosis    Rectal bleeding 03/07/2017   Sleep apnea    does not use cpap due to broke    Past Surgical History:  Procedure Laterality Date   CIRCUMCISION N/A 03/27/2017   Procedure: CIRCUMCISION ADULT with canal block;  Surgeon: Alexis Frock, MD;  Location: [facility];  Service: Urology;  Laterality: N/A;   CIRCUMCISION REVISION N/A 10/04/2017   Procedure: CIRCUMCISION REVISION;  Surgeon: Alexis Frock, MD;  Location: [facility];  Service: Urology;  Laterality: N/A;   COLONOSCOPY WITH PROPOFOL N/A 02/23/2021   Procedure: COLONOSCOPY WITH PROPOFOL;  Surgeon: Lavena Bullion, DO;  Location: [facility];  Service: Gastroenterology;  Laterality: N/A;   NO PAST SURGERIES     POLYPECTOMY  02/23/2021   Procedure: POLYPECTOMY;  Surgeon: Lavena Bullion, DO;  Location: [facility];  Service: Gastroenterology;;    Family History  Problem Relation Age of Onset   Heart disease Mother    Diabetes Father    Hypertension Father    Cirrhosis Father    Liver cancer Father    Heart disease Maternal Grandfather    Melanoma Paternal Grandmother    Lung cancer Paternal Uncle    Lung cancer  Paternal Aunt    Asthma Daughter    Gallbladder disease Daughter    Erythema nodosum Daughter    Pancreatic cancer Neg Hx    Esophageal cancer Neg Hx     Social History   Socioeconomic History   Marital status: Widowed    Spouse name: Not on file   Number of children: 1   Years of education: Not on file   Highest education level: Not on file  Occupational History   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  Tobacco Use   Smoking status: Former    Packs/day: 1.00    Years: 1.00    Total pack years: 1.00    Types: Cigarettes    Quit date: 07/30/1994    Years since quitting: 27.8   Smokeless tobacco: Never  Vaping Use   Vaping Use: Never used   Substance and Sexual Activity   Alcohol use: No    Alcohol/week: 0.0 standard drinks of alcohol   Drug use: No   Sexual activity: Not on file  Other Topics Concern   Not on file  Social History Narrative   Not on file   Social Determinants of Health   Financial Resource Strain: Not on file  Food Insecurity: Not on file  Transportation Needs: Not on file  Physical Activity: Not on file  Stress: Not on file  Social Connections: Not on file  Intimate Partner Violence: Not on file    Objective    BP (!) 177/75   Pulse 68   Temp 97.6 F (36.4 C) (Temporal)   Ht '5\' 11"'$  (1.803 m)   Wt (!) 374 lb 9.6 oz (169.9 kg)   SpO2 100%   BMI 52.25 kg/m   Physical Exam  46 year old male in no acute distress Cardiovascular exam with regular rate and rhythm, no murmur appreciated Lungs clear to auscultation bilaterally  Assessment & Plan:   Problem List Items Addressed This Visit       Cardiovascular and Mediastinum   HTN (hypertension) - Primary    Blood pressure is elevated in office today, slightly improved on recheck.  Likely we will need to proceed with pharmacotherapy in order to adequately control blood pressure and lower risk of complications related to hypertension      Relevant Orders   Hemoglobin A1c   Lipid panel   Comprehensive metabolic panel     Respiratory   Obstructive sleep apnea (Chronic)    Patient with underlying sleep apnea, has not been adequately treated recently due to not using CPAP.  It seems there have been various issues related to fit of mask as well as appropriate electrical source where he lives.  Reviewed with patient risks related to untreated sleep apnea and importance of needing to appropriately treat underlying condition We will place referral for patient to establish with sleep medicine specialist      Relevant Orders   Ambulatory referral to Neurology     Nervous and Auditory   Chronic bilateral low back pain with bilateral sciatica     Discussed general treatment considerations.  It has been a few years since his last lumbar spine x-rays, feel it would be reasonable to update x-rays in order to assess for any interval changes.  I also feel that he would benefit from formal physical therapy, referral has been placed today in order to have this arranged.  If symptoms persist despite conservative measures, appropriate trial of physical therapy, next step would be for completion of MRI to further assess  Relevant Orders   Ambulatory referral to Physical Therapy   DG Lumbar Spine Complete    Return in about 2 months (around 07/31/2022) for HTN, low back pain, nurse visit for labs a few days before.  We will plan for patient to check labs a few days prior to next appointment  Gatha Mcnulty J De [place], MD

## 2022-07-02 ENCOUNTER — Other Ambulatory Visit: Payer: Self-pay

## 2022-07-02 ENCOUNTER — Ambulatory Visit: Payer: Medicaid Other | Attending: Family Medicine

## 2022-07-02 DIAGNOSIS — M5441 Lumbago with sciatica, right side: Secondary | ICD-10-CM | POA: Diagnosis not present

## 2022-07-02 DIAGNOSIS — M5459 Other low back pain: Secondary | ICD-10-CM | POA: Diagnosis not present

## 2022-07-02 DIAGNOSIS — M6281 Muscle weakness (generalized): Secondary | ICD-10-CM | POA: Diagnosis not present

## 2022-07-02 DIAGNOSIS — M5442 Lumbago with sciatica, left side: Secondary | ICD-10-CM | POA: Diagnosis not present

## 2022-07-02 DIAGNOSIS — G8929 Other chronic pain: Secondary | ICD-10-CM | POA: Insufficient documentation

## 2022-07-02 DIAGNOSIS — R2689 Other abnormalities of gait and mobility: Secondary | ICD-10-CM | POA: Diagnosis not present

## 2022-07-02 NOTE — Therapy (Signed)
OUTPATIENT PHYSICAL THERAPY THORACOLUMBAR EVALUATION   Patient Name: Reginald Campbell MRN: [id number] DOB:27-Nov-1975, 46 y.o., male Today's Date: 07/02/2022  END OF SESSION:  PT End of Session - 07/02/22 1716     Visit Number 1    Number of Visits 17    Date for PT Re-Evaluation 08/27/22    PT Start Time 9311    PT Stop Time 1525    PT Time Calculation (min) 40 min    Activity Tolerance Patient tolerated treatment well    Behavior During Therapy [facility] for tasks assessed/performed             Past Medical History:  Diagnosis Date   Arthritis    Asthma    Back pain    BRBPR (bright red blood per rectum) 08/30/6242   Complication of circumcision 01/23/2017   Depression    Family history of adverse reaction to anesthesia    daughter under anesthesia for 6 hours had problems with N/V    Family history of diabetes mellitus 04/07/2015   History of kidney stones    Hypertension    hx of elevated bp, no meds for htn   Lumbar radiculopathy, chronic 10/01/2016   Nephrolithiasis 04/18/2015   Phimosis    Rectal bleeding 03/07/2017   Sleep apnea    does not use cpap due to broke   Past Surgical History:  Procedure Laterality Date   CIRCUMCISION N/A 03/27/2017   Procedure: CIRCUMCISION ADULT with canal block;  Surgeon: Alexis Frock, MD;  Location: [facility];  Service: Urology;  Laterality: N/A;   CIRCUMCISION REVISION N/A 10/04/2017   Procedure: CIRCUMCISION REVISION;  Surgeon: Alexis Frock, MD;  Location: [facility];  Service: Urology;  Laterality: N/A;   COLONOSCOPY WITH PROPOFOL N/A 02/23/2021   Procedure: COLONOSCOPY WITH PROPOFOL;  Surgeon: Lavena Bullion, DO;  Location: [facility];  Service: Gastroenterology;  Laterality: N/A;   NO PAST SURGERIES     POLYPECTOMY  02/23/2021   Procedure: POLYPECTOMY;  Surgeon: Lavena Bullion, DO;  Location: [facility];  Service: Gastroenterology;;   Patient Active Problem List   Diagnosis Date Noted   Adenomatous polyp of ascending colon     Polyp of sigmoid colon    Grade III internal hemorrhoids    Screening for colorectal cancer 08/31/2020   Achilles tendonitis, bilateral 05/20/2020   Atypical mole 03/12/2019   HTN (hypertension) 10/22/2018   Chronic bilateral low back pain with bilateral sciatica 04/09/2018   Internal hemorrhoids 03/08/2017   Morbid obesity ([place]) 10/02/2016   MDD (major depressive disorder) 10/01/2016   Bilateral shoulder pain 08/18/2015   Healthcare maintenance 04/07/2015   Obstructive sleep apnea 04/07/2015    PCP: de [place], Raymond J, MD  REFERRING PROVIDER: de [place], Raymond J, MD  REFERRING DIAG: M54.42,M54.41,G89.29 (ICD-10-CM) - Chronic bilateral low back pain with bilateral sciatica   Rationale for Evaluation and Treatment: Rehabilitation  THERAPY DIAG:  Other low back pain  Muscle weakness (generalized)  Other abnormalities of gait and mobility  ONSET DATE: Chronic  SUBJECTIVE:  SUBJECTIVE STATEMENT: Pt presents to PT with reports of chronic LBP since work related injury in 2017. 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 He denies bowel.bladder changes or saddle anesthesia. Would like to decrease pain in order to improve comfort with care giving duties and home ADLs.   PERTINENT HISTORY:  HTN, Depression   PAIN:  Are you having pain?  Yes: NPRS scale: 4/10 Worst: 10/10 Pain location: lower back Pain description: [place] Aggravating factors: lifting, transferring his cousin, prolonged postioning   Relieving factors: none  PRECAUTIONS: None  WEIGHT BEARING RESTRICTIONS: No  FALLS:  Has patient fallen in last 6 months? No  LIVING ENVIRONMENT: Lives with:  lives with their family Lives in: House/apartment Stairs: Yes - no trouble Has following equipment at home: None  OCCUPATION: Not currently working   PLOF: Independent and Independent with basic ADLs  PATIENT GOALS: decrease pain in order to improve comfort with care giving duties and home ADLs  NEXT MD VISIT: 07/26/2022  OBJECTIVE:   DIAGNOSTIC FINDINGS:  See imaging   PATIENT SURVEYS:  ODI: 50% disability  COGNITION: Overall cognitive status: Within functional limits for tasks assessed   SENSATION: WFL  POSTURE: rounded shoulders, forward head, and increased lumbar lordosis; larger body habitus   PALPATION: TTP to bilateral lumbar paraspinals; R>L  LUMBAR ROM:   AROM eval  Flexion 25% reduced  Extension 75% reduced  Right lateral flexion   Left lateral flexion   Right rotation 50% reduced  Left rotation 50% reduced   (Blank rows = not tested)  LOWER EXTREMITY MMT:    MMT Right eval Left eval  Hip flexion 3+/5 3+/5  Hip extension    Hip abduction 3+/5 3+/5  Hip adduction 3+/5 3+/5  Hip internal rotation    Hip external rotation    Knee flexion 5/5 5/5  Knee extension 5/5 5/5  Ankle dorsiflexion    Ankle plantarflexion    Ankle inversion    Ankle eversion     (Blank rows = not tested)  LUMBAR SPECIAL TESTS:  Straight leg raise test: Negative and Slump test: Negative  FUNCTIONAL TESTS:  30 Second Sit to Stand: 8 reps  GAIT: Distance walked: 59f Assistive device utilized: None Level of assistance: Complete Independence Comments: trunk flexed, antalgic gait  TREATMENT: [facility] Adult PT Treatment:                                                DATE: 07/02/2022 Therapeutic Exercise: Supine PPT x 5 - 5" hold LTR x 5 each Bridge x 5 Seated lumbar flex x 5 - 5" hold  PATIENT EDUCATION:  Education details: eval findings, ODI, HEP, POC Person educated: Patient Education method: Explanation, Demonstration, and Handouts Education comprehension:  verbalized understanding and returned demonstration  HOME EXERCISE PROGRAM: Access Code: [email or web address] URL: [email or web address] Date: 07/02/2022 Prepared by: DOctavio Manns Exercises - Supine Posterior Pelvic Tilt  - 2 x daily - 7 x weekly - 2 sets - 10 reps - 5 sec hold - Supine Lower Trunk Rotation  - 2 x daily - 7 x weekly - 2 sets - 10 reps - Supine Bridge  - 2 x daily - 7 x weekly - 2 sets -  10 reps - Seated Lumbar Flexion Stretch  - 2 x daily - 7 x weekly - 2 sets - 10 reps - 5 sec hold  ASSESSMENT:  CLINICAL IMPRESSION: Patient is a 46 y.o. M who was seen today for physical therapy evaluation and treatment for chronic LBP. Physical findings are consistent with MD impression as pt has weakness in core and proximal hip along with decline in functional mobility assessed via 30 Second Sit to Stand. His ODI demonstrates severe disability with the performance of home ADLs and community activities, indicating he is below PLOF. Pt would benefit from skilled PT working on improving core and proximal hip strength in order to decrease pain and improve function.    OBJECTIVE IMPAIRMENTS: decreased activity tolerance, decreased endurance, decreased mobility, difficulty walking, decreased ROM, decreased strength, obesity, and pain.   ACTIVITY LIMITATIONS: carrying, lifting, bending, standing, squatting, stairs, transfers, and bed mobility  PARTICIPATION LIMITATIONS: meal prep, driving, shopping, community activity, and yard work  PERSONAL FACTORS: Fitness, Time since onset of injury/illness/exacerbation, and 1-2 comorbidities: HTN, Depression   are also affecting patient's functional outcome.   REHAB POTENTIAL: Good  CLINICAL DECISION MAKING: Evolving/moderate complexity  EVALUATION COMPLEXITY: Moderate   GOALS: Goals reviewed with patient? No  SHORT TERM GOALS: Target date: 07/23/2022   Pt will be compliant and knowledgeable with initial HEP for improved comfort and  carryover Baseline: initial HEP given  Goal status: INITIAL  2.  Pt will self report back pain no greater than 6/10 for improved comfort and functional ability Baseline: 10/10 at worst Goal status: INITIAL   LONG TERM GOALS: Target date: 08/27/2022   Pt will increase 30 Second Sit to Stand rep count to no less than 10 reps for improved balance, strength, and functional mobility Baseline: 8 reps  Goal status: INITIAL   2.  Pt will be decrease ODI disability score to no greater than 35% as proxy for functional improvement Baseline: 50% disability  Goal status: INITIAL  3.  Pt will self report back pain no greater than 3/10 for improved comfort and functional ability Baseline: 10/10 at worst Goal status: INITIAL   4.  Pt will be able to transfer his cousin for care giving duties with no increase in back pain for improved comfort and functional ability  Baseline: unable Goal status: INITIAL  PLAN:  PT FREQUENCY: 2x/week  PT DURATION: 8 weeks  PLANNED INTERVENTIONS: Therapeutic exercises, Therapeutic activity, Neuromuscular re-education, Balance training, Gait training, Patient/Family education, Self Care, Joint mobilization, Aquatic Therapy, Dry Needling, Electrical stimulation, Cryotherapy, Moist heat, Vasopneumatic device, Manual therapy, and Re-evaluation.  PLAN FOR NEXT SESSION: assess HEP response, core and hip strengthening, progress as able   Check all possible CPT codes: 46270 - PT Re-evaluation, 97110- Therapeutic Exercise, [phone]- Neuro Re-education, [phone] - Gait Training, [phone] - Manual Therapy, [phone] - Therapeutic Activities, [phone] - Self Care, [phone] - Electrical stimulation (Manual), and [id number] - Aquatic therapy    Check all conditions that are expected to impact treatment: None of these apply   If treatment provided at initial evaluation, no treatment charged due to lack of authorization.        Ward Chatters, PT 07/02/2022, 5:17 PM

## 2022-07-10 NOTE — Therapy (Signed)
OUTPATIENT PHYSICAL THERAPY TREATMENT NOTE   Patient Name: Reginald Campbell MRN: [id number] DOB:1976/06/26, 46 y.o., male Today's Date: 07/11/2022  PCP: Tennis Must [place], Blondell Reveal, MD  REFERRING PROVIDER: de [place], Raymond J, MD   END OF SESSION:   PT End of Session - 07/11/22 1249     Visit Number 2    Number of Visits 17    Date for PT Re-Evaluation 08/27/22    PT Start Time 1250    PT Stop Time 5465    PT Time Calculation (min) 45 min    Activity Tolerance Patient tolerated treatment well;Patient limited by pain    Behavior During Therapy [facility] for tasks assessed/performed             Past Medical History:  Diagnosis Date   Arthritis    Asthma    Back pain    BRBPR (bright red blood per rectum) 0/09/5463   Complication of circumcision 01/23/2017   Depression    Family history of adverse reaction to anesthesia    daughter under anesthesia for 6 hours had problems with N/V    Family history of diabetes mellitus 04/07/2015   History of kidney stones    Hypertension    hx of elevated bp, no meds for htn   Lumbar radiculopathy, chronic 10/01/2016   Nephrolithiasis 04/18/2015   Phimosis    Rectal bleeding 03/07/2017   Sleep apnea    does not use cpap due to broke   Past Surgical History:  Procedure Laterality Date   CIRCUMCISION N/A 03/27/2017   Procedure: CIRCUMCISION ADULT with canal block;  Surgeon: Alexis Frock, MD;  Location: [facility];  Service: Urology;  Laterality: N/A;   CIRCUMCISION REVISION N/A 10/04/2017   Procedure: CIRCUMCISION REVISION;  Surgeon: Alexis Frock, MD;  Location: [facility];  Service: Urology;  Laterality: N/A;   COLONOSCOPY WITH PROPOFOL N/A 02/23/2021   Procedure: COLONOSCOPY WITH PROPOFOL;  Surgeon: Lavena Bullion, DO;  Location: [facility];  Service: Gastroenterology;  Laterality: N/A;   NO PAST SURGERIES     POLYPECTOMY  02/23/2021   Procedure: POLYPECTOMY;  Surgeon: Lavena Bullion, DO;  Location[facility];  Service: Gastroenterology;;    Patient Active Problem List   Diagnosis Date Noted   Adenomatous polyp of ascending colon    Polyp of sigmoid colon    Grade III internal hemorrhoids    Screening for colorectal cancer 08/31/2020   Achilles tendonitis, bilateral 05/20/2020   Atypical mole 03/12/2019   HTN (hypertension) 10/22/2018   Chronic bilateral low back pain with bilateral sciatica 04/09/2018   Internal hemorrhoids 03/08/2017   Morbid obesity ([place]) 10/02/2016   MDD (major depressive disorder) 10/01/2016   Bilateral shoulder pain 08/18/2015   Healthcare maintenance 04/07/2015   Obstructive sleep apnea 04/07/2015    REFERRING DIAG: M54.42,M54.41,G89.29 (ICD-10-CM) - Chronic bilateral low back pain with bilateral sciatica    THERAPY DIAG:  Other low back pain  Muscle weakness (generalized)  Other abnormalities of gait and mobility  Rationale for Evaluation and Treatment Rehabilitation  PERTINENT HISTORY: HTN, Depression    PRECAUTIONS: None  SUBJECTIVE:  SUBJECTIVE STATEMENT:  Patient reports that he is having increased pain in his Rt elbow today, he plans on seeing his MD soon for this.    PAIN:  Are you having pain?  Yes: NPRS scale: 5/10 Worst: 10/10 Pain location: lower back Pain description: [place] Aggravating factors: lifting, transferring his cousin, prolonged postioning   Relieving factors: none   OBJECTIVE: (objective measures completed at initial evaluation unless otherwise dated)   DIAGNOSTIC FINDINGS:  See imaging    PATIENT SURVEYS:  ODI: 50% disability   COGNITION: Overall cognitive status: Within functional limits for tasks assessed               SENSATION: WFL   POSTURE: rounded shoulders, forward head, and increased lumbar lordosis; larger body habitus    PALPATION: TTP to bilateral  lumbar paraspinals; R>L   LUMBAR ROM:    AROM eval  Flexion 25% reduced  Extension 75% reduced  Right lateral flexion    Left lateral flexion    Right rotation 50% reduced  Left rotation 50% reduced   (Blank rows = not tested)   LOWER EXTREMITY MMT:     MMT Right eval Left eval  Hip flexion 3+/5 3+/5  Hip extension      Hip abduction 3+/5 3+/5  Hip adduction 3+/5 3+/5  Hip internal rotation      Hip external rotation      Knee flexion 5/5 5/5  Knee extension 5/5 5/5  Ankle dorsiflexion      Ankle plantarflexion      Ankle inversion      Ankle eversion       (Blank rows = not tested)   LUMBAR SPECIAL TESTS:  Straight leg raise test: Negative and Slump test: Negative   FUNCTIONAL TESTS:  30 Second Sit to Stand: 8 reps   GAIT: Distance walked: 56f Assistive device utilized: None Level of assistance: Complete Independence Comments: trunk flexed, antalgic gait   TREATMENT: [facility] Adult PT Treatment:                                                DATE: 07/11/2022 Therapeutic Exercise: Palloff press 17# 2x10 Lt only, too painful on Rt elbow Cybex hip flexion/extension/abduction 37.5# x10 each BIL Supine marching 2x10 BIL Supine sciatic nerve glide with towel x20 BIL Supine PPT 5" hold x10 Bridges x10, x5 LTR x10 BIL Seated pball roll out forward x8 (painful) 10# KB deadlift from 12" stacked boxes x10 STS holding 10# KB x10 Modalities: Supine exercises performed lying on MHP with towel  [facility] Adult PT Treatment:                                                DATE: 07/02/2022 Therapeutic Exercise: Supine PPT x 5 - 5" hold LTR x 5 each Bridge x 5 Seated lumbar flex x 5 - 5" hold   PATIENT EDUCATION:  Education details: eval findings, ODI, HEP, POC Person educated: Patient Education method: Explanation, Demonstration, and Handouts Education comprehension: verbalized understanding and returned demonstration   HOME EXERCISE PROGRAM: Access Code:  [email or web address] URL: [email or web address] Date: 07/02/2022 Prepared by: DOctavio Manns  Exercises - Supine Posterior Pelvic Tilt  - 2 x daily - 7 x weekly -  2 sets - 10 reps - 5 sec hold - Supine Lower Trunk Rotation  - 2 x daily - 7 x weekly - 2 sets - 10 reps - Supine Bridge  - 2 x daily - 7 x weekly - 2 sets - 10 reps - Seated Lumbar Flexion Stretch  - 2 x daily - 7 x weekly - 2 sets - 10 reps - 5 sec hold   ASSESSMENT:   CLINICAL IMPRESSION: Patient presents to PT with continued reports of lower back pain and also reports that his Rt elbow started hurting when he woke up and that he plans to see his MD for this soon. Session today focused on proximal hip and core strengthening. He is limited by pain throughout session, especially with mat based core exercises. Utilized hot pack during supine exercises to reduce pain and enable patient to participate in exercises. Patient continues to benefit from skilled PT services and should be progressed as able to improve functional independence.     OBJECTIVE IMPAIRMENTS: decreased activity tolerance, decreased endurance, decreased mobility, difficulty walking, decreased ROM, decreased strength, obesity, and pain.    ACTIVITY LIMITATIONS: carrying, lifting, bending, standing, squatting, stairs, transfers, and bed mobility   PARTICIPATION LIMITATIONS: meal prep, driving, shopping, community activity, and yard work   PERSONAL FACTORS: Fitness, Time since onset of injury/illness/exacerbation, and 1-2 comorbidities: HTN, Depression   are also affecting patient's functional outcome.    REHAB POTENTIAL: Good   CLINICAL DECISION MAKING: Evolving/moderate complexity   EVALUATION COMPLEXITY: Moderate     GOALS: Goals reviewed with patient? No   SHORT TERM GOALS: Target date: 07/23/2022   Pt will be compliant and knowledgeable with initial HEP for improved comfort and carryover Baseline: initial HEP given  Goal status: INITIAL   2.  Pt  will self report back pain no greater than 6/10 for improved comfort and functional ability Baseline: 10/10 at worst Goal status: INITIAL    LONG TERM GOALS: Target date: 08/27/2022   Pt will increase 30 Second Sit to Stand rep count to no less than 10 reps for improved balance, strength, and functional mobility Baseline: 8 reps  Goal status: INITIAL    2.  Pt will be decrease ODI disability score to no greater than 35% as proxy for functional improvement Baseline: 50% disability  Goal status: INITIAL   3.  Pt will self report back pain no greater than 3/10 for improved comfort and functional ability Baseline: 10/10 at worst Goal status: INITIAL    4.  Pt will be able to transfer his cousin for care giving duties with no increase in back pain for improved comfort and functional ability  Baseline: unable Goal status: INITIAL   PLAN:   PT FREQUENCY: 2x/week   PT DURATION: 8 weeks   PLANNED INTERVENTIONS: Therapeutic exercises, Therapeutic activity, Neuromuscular re-education, Balance training, Gait training, Patient/Family education, Self Care, Joint mobilization, Aquatic Therapy, Dry Needling, Electrical stimulation, Cryotherapy, Moist heat, Vasopneumatic device, Manual therapy, and Re-evaluation.   PLAN FOR NEXT SESSION: assess HEP response, core and hip strengthening, progress as able      Margarette Canada, PTA 07/11/2022, 1:41 PM

## 2022-07-11 ENCOUNTER — Ambulatory Visit: Payer: Medicaid Other

## 2022-07-11 DIAGNOSIS — R2689 Other abnormalities of gait and mobility: Secondary | ICD-10-CM

## 2022-07-11 DIAGNOSIS — M5459 Other low back pain: Secondary | ICD-10-CM | POA: Diagnosis not present

## 2022-07-11 DIAGNOSIS — G8929 Other chronic pain: Secondary | ICD-10-CM | POA: Diagnosis not present

## 2022-07-11 DIAGNOSIS — M5441 Lumbago with sciatica, right side: Secondary | ICD-10-CM | POA: Diagnosis not present

## 2022-07-11 DIAGNOSIS — M6281 Muscle weakness (generalized): Secondary | ICD-10-CM

## 2022-07-11 DIAGNOSIS — M5442 Lumbago with sciatica, left side: Secondary | ICD-10-CM | POA: Diagnosis not present

## 2022-07-11 NOTE — Patient Instructions (Signed)
Aquatic Therapy at [facility]-  What to Expect!  Where:   [facility] [address] [facility] [place], [place] [address] Rehab phone [phone]  NOTE:  You will receive an automated phone message reminding you of your appt and it will say the appointment is at the [address] [facility] [address] [facility] clinic.          How to Prepare: Please make sure you drink 8 ounces of water about one hour prior to your pool session A caregiver may attend if needed with the patient to help assist as needed. A caregiver can sit in the pool room on chair. Please arrive IN YOUR SUIT and 15 minutes prior to your appointment - this helps to avoid delays in starting your session. Please make sure to attend to any toileting needs prior to entering the pool Locker rooms for changing are provided.   There is direct access to the pool deck form the locker room.  You can lock your belongings in a locker with lock provided. Once on the pool deck your therapist will ask if you have signed the Patient  Consent and Assignment of Benefits form before beginning treatment Your therapist may take your blood pressure prior to, during and after your session if indicated We usually try and create a home exercise program based on activities we do in the pool.  Please be thinking about who might be able to assist you in the pool should you need to participate in an aquatic home exercise program at the time of discharge if you need assistance.  Some patients do not want to or do not have the ability to participate in an aquatic home program - this is not a barrier in any way to you participating in aquatic therapy as part of your current therapy plan! After Discharge from PT, you can continue using home program at  the [facility]/, there is a drop-in fee for $5 ($45 a month)or for 60 years  or older $4.00 ($40 a month for seniors ) or any local [employer] pool.  Memberships for purchase are  available for gym/pool at [facility]  IT IS VERY IMPORTANT THAT YOUR LAST VISIT BE IN THE CLINIC AT [address] AFTER YOUR LAST AQUATIC VISIT.  PLEASE MAKE SURE THAT YOU HAVE A [address]  APPOINTMENT SCHEDULED.   About the pool: Pool is located approximately 500 FT from the entrance of the building.  Please bring a support person if you need assistance traveling this      distance.   Your therapist will assist you in entering the water; there are two ways to           enter: stairs with railings, and a mechanical lift. Your therapist will determine the most appropriate way for you.  Water temperature is usually between 88-90 degrees  There may be up to 2 other swimmers in the pool at the same time  The pool deck is tile, please wear shoes with good traction if you prefer not to be barefoot.    Contact Info:  For appointment scheduling and cancellations:         Please call the [facility]  PH:[phone]              Aquatic Therapy  Outpatient [facility] @ [facility]       All sessions are 45 minutes                                                    

## 2022-07-19 ENCOUNTER — Ambulatory Visit (HOSPITAL_BASED_OUTPATIENT_CLINIC_OR_DEPARTMENT_OTHER): Payer: Medicaid Other | Attending: Family Medicine | Admitting: Physical Therapy

## 2022-07-19 DIAGNOSIS — M5441 Lumbago with sciatica, right side: Secondary | ICD-10-CM | POA: Insufficient documentation

## 2022-07-19 DIAGNOSIS — G8929 Other chronic pain: Secondary | ICD-10-CM | POA: Diagnosis not present

## 2022-07-19 DIAGNOSIS — M5442 Lumbago with sciatica, left side: Secondary | ICD-10-CM | POA: Diagnosis not present

## 2022-07-19 DIAGNOSIS — R2689 Other abnormalities of gait and mobility: Secondary | ICD-10-CM | POA: Insufficient documentation

## 2022-07-19 DIAGNOSIS — M6281 Muscle weakness (generalized): Secondary | ICD-10-CM | POA: Diagnosis not present

## 2022-07-19 DIAGNOSIS — M5459 Other low back pain: Secondary | ICD-10-CM

## 2022-07-19 NOTE — Therapy (Signed)
OUTPATIENT PHYSICAL THERAPY TREATMENT NOTE   Patient Name: Reginald Campbell MRN: [id number] DOB:1976-04-14, 46 y.o., male Today's Date: 07/19/2022  PCP: de [place], Raymond J, MD  REFERRING PROVIDER: [place], Raymond J, MD   END OF SESSION:   PT End of Session - 07/19/22 1204     Visit Number 3    Number of Visits 17    Date for PT Re-Evaluation 08/27/22    PT Start Time 1203    PT Stop Time 1243    PT Time Calculation (min) 40 min             Past Medical History:  Diagnosis Date   Arthritis    Asthma    Back pain    BRBPR (bright red blood per rectum) 01/30/1637   Complication of circumcision 01/23/2017   Depression    Family history of adverse reaction to anesthesia    daughter under anesthesia for 6 hours had problems with N/V    Family history of diabetes mellitus 04/07/2015   History of kidney stones    Hypertension    hx of elevated bp, no meds for htn   Lumbar radiculopathy, chronic 10/01/2016   Nephrolithiasis 04/18/2015   Phimosis    Rectal bleeding 03/07/2017   Sleep apnea    does not use cpap due to broke   Past Surgical History:  Procedure Laterality Date   CIRCUMCISION N/A 03/27/2017   Procedure: CIRCUMCISION ADULT with canal block;  Surgeon: Alexis Frock, MD;  Location: [facility];  Service: Urology;  Laterality: N/A;   CIRCUMCISION REVISION N/A 10/04/2017   Procedure: CIRCUMCISION REVISION;  Surgeon: Alexis Frock, MD;  Location: [facility];  Service: Urology;  Laterality: N/A;   COLONOSCOPY WITH PROPOFOL N/A 02/23/2021   Procedure: COLONOSCOPY WITH PROPOFOL;  Surgeon: Lavena Bullion, DO;  Location: [facility];  Service: Gastroenterology;  Laterality: N/A;   NO PAST SURGERIES     POLYPECTOMY  02/23/2021   Procedure: POLYPECTOMY;  Surgeon: Lavena Bullion, DO;  Location: [facility];  Service: Gastroenterology;;   Patient Active Problem List   Diagnosis Date Noted   Adenomatous polyp of ascending colon    Polyp of sigmoid colon    Grade III internal  hemorrhoids    Screening for colorectal cancer 08/31/2020   Achilles tendonitis, bilateral 05/20/2020   Atypical mole 03/12/2019   HTN (hypertension) 10/22/2018   Chronic bilateral low back pain with bilateral sciatica 04/09/2018   Internal hemorrhoids 03/08/2017   Morbid obesity ([place]) 10/02/2016   MDD (major depressive disorder) 10/01/2016   Bilateral shoulder pain 08/18/2015   Healthcare maintenance 04/07/2015   Obstructive sleep apnea 04/07/2015    REFERRING DIAG: M54.42,M54.41,G89.29 (ICD-10-CM) - Chronic bilateral low back pain with bilateral sciatica    THERAPY DIAG:  Other low back pain  Muscle weakness (generalized)  Other abnormalities of gait and mobility  Rationale for Evaluation and Treatment Rehabilitation  PERTINENT HISTORY: HTN, Depression    PRECAUTIONS: None  SUBJECTIVE:  SUBJECTIVE STATEMENT:  Pt reports he is doing the HEP, but is unable to do all of the reps. He states his lower back feels really tight, like it needs to be stretched. 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   PAIN:  Are you having pain? yes NPRS scale: 5/10 Pain location: lower back Pain description: achy Aggravating factors: lifting, occasional assisting with mobility/transfers of his cousin (no lifting), prolonged postioning   Relieving factors: none   OBJECTIVE: (objective measures completed at initial evaluation unless otherwise dated)   DIAGNOSTIC FINDINGS:  See imaging    PATIENT SURVEYS:  ODI: 50% disability   COGNITION: Overall cognitive status: Within functional limits for tasks assessed               SENSATION: WFL   POSTURE: rounded shoulders, forward head, and increased lumbar lordosis; larger body habitus    PALPATION: TTP to bilateral lumbar  paraspinals; R>L   LUMBAR ROM:    AROM eval  Flexion 25% reduced  Extension 75% reduced  Right lateral flexion    Left lateral flexion    Right rotation 50% reduced  Left rotation 50% reduced   (Blank rows = not tested)   LOWER EXTREMITY MMT:     MMT Right eval Left eval  Hip flexion 3+/5 3+/5  Hip extension      Hip abduction 3+/5 3+/5  Hip adduction 3+/5 3+/5  Hip internal rotation      Hip external rotation      Knee flexion 5/5 5/5  Knee extension 5/5 5/5  Ankle dorsiflexion      Ankle plantarflexion      Ankle inversion      Ankle eversion       (Blank rows = not tested)   LUMBAR SPECIAL TESTS:  Straight leg raise test: Negative and Slump test: Negative   FUNCTIONAL TESTS:  30 Second Sit to Stand: 8 reps   GAIT: Distance walked: 93f Assistive device utilized: None Level of assistance: Complete Independence Comments: trunk flexed, antalgic gait   TREATMENT:  [facility] Adult PT Treatment:                                                DATE: 07/19/2022 Pt seen for aquatic therapy today.  Treatment took place in water 4-4.75 ft [facility] Temp of water was 88.  Pt entered/exited the pool via stairs independently with bilat rail.  * walking forward/ backward without assistance * side stepping  * high knee marching forward/ backward with reciprocal arm swing * holding wall:  heel raises x 20; hip circles CW/ CCW (knee straight) * side step with squat and arm abdct/ add * solid noodle pull down to thighs x 10 * holding yellow hand floats for support: leg swings forward/backward 10 x 2; hip abdct/ addct 10 x 2 * rainbow hand floats with unilateral shoulder flex /ext (0-90) x 5 each; shoulder abdct/add x 3 (irritated elbow); tricep press down x 5  * L stretch holding wall with gentle wag the tail * seated on thick square noodle: cycling, cc ski and jumping jack LEs with feet lightly touching ground   Pt requires the buoyancy and  hydrostatic pressure of water for support, and to offload joints by unweighting joint load by  at least 50 % in navel deep water and by at least 75-80% in chest to neck deep water.  Viscosity of the water is needed for resistance of strengthening. Water current perturbations provides challenge to standing balance requiring increased core activation.    [facility] Adult PT Treatment:                                                DATE: 07/11/2022 Therapeutic Exercise: Palloff press 17# 2x10 Lt only, too painful on Rt elbow Cybex hip flexion/extension/abduction 37.5# x10 each BIL Supine marching 2x10 BIL Supine sciatic nerve glide with towel x20 BIL Supine PPT 5" hold x10 Bridges x10, x5 LTR x10 BIL Seated pball roll out forward x8 (painful) 10# KB deadlift from 12" stacked boxes x10 STS holding 10# KB x10 Modalities: Supine exercises performed lying on MHP with towel  [facility] Adult PT Treatment:                                                DATE: 07/02/2022 Therapeutic Exercise: Supine PPT x 5 - 5" hold LTR x 5 each Bridge x 5 Seated lumbar flex x 5 - 5" hold   PATIENT EDUCATION:  Education details: eval findings, ODI, HEP, POC Person educated: Patient Education method: Explanation, Demonstration, and Handouts Education comprehension: verbalized understanding and returned demonstration   HOME EXERCISE PROGRAM: Access Code: [email or web address] URL: [email or web address] Date: 07/02/2022 Prepared by: Octavio Manns   Exercises - Supine Posterior Pelvic Tilt  - 2 x daily - 7 x weekly - 2 sets - 10 reps - 5 sec hold - Supine Lower Trunk Rotation  - 2 x daily - 7 x weekly - 2 sets - 10 reps - Supine Bridge  - 2 x daily - 7 x weekly - 2 sets - 10 reps - Seated Lumbar Flexion Stretch  - 2 x daily - 7 x weekly - 2 sets - 10 reps - 5 sec hold   ASSESSMENT:   CLINICAL IMPRESSION: Pt is confident and independent in aquatic setting; able to take cues/direction from therapist on deck.   Pt  reported increase in Rt low back pain with attempts at lat/low back stretch and with R hip ext. He would benefit from trial of Rt hip flexor stretch at next visit.  He reported no change in overall pain when in the water, but his R low back did "pop" when performed resisted ext (to neutral) and "felt looser" at end of session. Goals are ongoing.  Patient continues to benefit from skilled PT services and should be progressed as able to improve functional independence with less pain.      OBJECTIVE IMPAIRMENTS: decreased activity tolerance, decreased endurance, decreased mobility, difficulty walking, decreased ROM, decreased strength, obesity, and pain.    ACTIVITY LIMITATIONS: carrying, lifting, bending, standing, squatting, stairs, transfers, and bed mobility   PARTICIPATION LIMITATIONS: meal prep, driving, shopping, community activity, and yard work   PERSONAL FACTORS: Fitness, Time since onset of injury/illness/exacerbation, and 1-2 comorbidities: HTN, Depression   are also affecting patient's functional outcome.    REHAB POTENTIAL: Good   CLINICAL DECISION MAKING: Evolving/moderate complexity   EVALUATION COMPLEXITY: Moderate     GOALS: Goals reviewed with patient? No  SHORT TERM GOALS: Target date: 07/23/2022   Pt will be compliant and knowledgeable with initial HEP for improved comfort and carryover Baseline: initial HEP given  Goal status: INITIAL   2.  Pt will self report back pain no greater than 6/10 for improved comfort and functional ability Baseline: 10/10 at worst Goal status: INITIAL    LONG TERM GOALS: Target date: 08/27/2022   Pt will increase 30 Second Sit to Stand rep count to no less than 10 reps for improved balance, strength, and functional mobility Baseline: 8 reps  Goal status: INITIAL    2.  Pt will be decrease ODI disability score to no greater than 35% as proxy for functional improvement Baseline: 50% disability  Goal status: INITIAL   3.  Pt will  self report back pain no greater than 3/10 for improved comfort and functional ability Baseline: 10/10 at worst Goal status: INITIAL    4.  Pt will be able to transfer his cousin for care giving duties with no increase in back pain for improved comfort and functional ability  Baseline: unable Goal status: INITIAL   PLAN:   PT FREQUENCY: 2x/week   PT DURATION: 8 weeks   PLANNED INTERVENTIONS: Therapeutic exercises, Therapeutic activity, Neuromuscular re-education, Balance training, Gait training, Patient/Family education, Self Care, Joint mobilization, Aquatic Therapy, Dry Needling, Electrical stimulation, Cryotherapy, Moist heat, Vasopneumatic device, Manual therapy, and Re-evaluation.   PLAN FOR NEXT SESSION: assess HEP response, core and hip strengthening, progress as able    Kerin Perna, PTA 07/19/22 1:45 PM [facility] [address] [place], [place], [address] Phone: [phone]   Fax:  [phone]

## 2022-07-24 ENCOUNTER — Ambulatory Visit: Payer: Medicaid Other

## 2022-07-24 DIAGNOSIS — M5459 Other low back pain: Secondary | ICD-10-CM

## 2022-07-24 DIAGNOSIS — M6281 Muscle weakness (generalized): Secondary | ICD-10-CM | POA: Diagnosis not present

## 2022-07-24 DIAGNOSIS — M5442 Lumbago with sciatica, left side: Secondary | ICD-10-CM | POA: Diagnosis not present

## 2022-07-24 DIAGNOSIS — R2689 Other abnormalities of gait and mobility: Secondary | ICD-10-CM | POA: Diagnosis not present

## 2022-07-24 DIAGNOSIS — M5441 Lumbago with sciatica, right side: Secondary | ICD-10-CM | POA: Diagnosis not present

## 2022-07-24 DIAGNOSIS — G8929 Other chronic pain: Secondary | ICD-10-CM | POA: Diagnosis not present

## 2022-07-24 NOTE — Therapy (Signed)
OUTPATIENT PHYSICAL THERAPY TREATMENT NOTE   Patient Name: Reginald Campbell MRN: [id number] DOB:February 12, 1976, 46 y.o., male Today's Date: 07/24/2022  PCP: Tennis Must [place], Blondell Reveal, MD  REFERRING PROVIDER: de [place], Raymond J, MD   END OF SESSION:   PT End of Session - 07/24/22 1306     Visit Number 4    Number of Visits 17    Date for PT Re-Evaluation 08/27/22    PT Start Time 4098    PT Stop Time 1301   2 units, limited to activity due to pain   PT Time Calculation (min) 31 min    Activity Tolerance Patient limited by pain    Behavior During Therapy [facility] for tasks assessed/performed              Past Medical History:  Diagnosis Date   Arthritis    Asthma    Back pain    BRBPR (bright red blood per rectum) 07/30/9145   Complication of circumcision 01/23/2017   Depression    Family history of adverse reaction to anesthesia    daughter under anesthesia for 6 hours had problems with N/V    Family history of diabetes mellitus 04/07/2015   History of kidney stones    Hypertension    hx of elevated bp, no meds for htn   Lumbar radiculopathy, chronic 10/01/2016   Nephrolithiasis 04/18/2015   Phimosis    Rectal bleeding 03/07/2017   Sleep apnea    does not use cpap due to broke   Past Surgical History:  Procedure Laterality Date   CIRCUMCISION N/A 03/27/2017   Procedure: CIRCUMCISION ADULT with canal block;  Surgeon: Alexis Frock, MD;  Location: [facility];  Service: Urology;  Laterality: N/A;   CIRCUMCISION REVISION N/A 10/04/2017   Procedure: CIRCUMCISION REVISION;  Surgeon: Alexis Frock, MD;  Location: [facility];  Service: Urology;  Laterality: N/A;   COLONOSCOPY WITH PROPOFOL N/A 02/23/2021   Procedure: COLONOSCOPY WITH PROPOFOL;  Surgeon: Lavena Bullion, DO;  Location: [facility];  Service: Gastroenterology;  Laterality: N/A;   NO PAST SURGERIES     POLYPECTOMY  02/23/2021   Procedure: POLYPECTOMY;  Surgeon: Lavena Bullion, DO;  Location: [facility];  Service:  Gastroenterology;;   Patient Active Problem List   Diagnosis Date Noted   Adenomatous polyp of ascending colon    Polyp of sigmoid colon    Grade III internal hemorrhoids    Screening for colorectal cancer 08/31/2020   Achilles tendonitis, bilateral 05/20/2020   Atypical mole 03/12/2019   HTN (hypertension) 10/22/2018   Chronic bilateral low back pain with bilateral sciatica 04/09/2018   Internal hemorrhoids 03/08/2017   Morbid obesity ([place]) 10/02/2016   MDD (major depressive disorder) 10/01/2016   Bilateral shoulder pain 08/18/2015   Healthcare maintenance 04/07/2015   Obstructive sleep apnea 04/07/2015    REFERRING DIAG: M54.42,M54.41,G89.29 (ICD-10-CM) - Chronic bilateral low back pain with bilateral sciatica    THERAPY DIAG:  Other low back pain  Muscle weakness (generalized)  Other abnormalities of gait and mobility  Rationale for Evaluation and Treatment Rehabilitation  PERTINENT HISTORY: HTN, Depression    PRECAUTIONS: None  SUBJECTIVE:  SUBJECTIVE STATEMENT:  "I'm really hurting today. 9/10 pain in left lower back. I played with my granddaughter yesterday and this could have something to do with it. I see my doctor tomorrow to talk to him about my back and right elbow pain."  PAIN:  Are you having pain? yes NPRS scale: 9/10 Pain location: left lower back Pain description: achy Aggravating factors: lifting, occasional assisting with mobility/transfers of his cousin (no lifting), prolonged postioning   Relieving factors: none   OBJECTIVE: (objective measures completed at initial evaluation unless otherwise dated)   DIAGNOSTIC FINDINGS:  See imaging    PATIENT SURVEYS:  ODI: 50% disability   COGNITION: Overall cognitive status: Within functional limits for tasks assessed                SENSATION: WFL   POSTURE: rounded shoulders, forward head, and increased lumbar lordosis; larger body habitus    PALPATION: TTP to bilateral lumbar paraspinals; R>L   LUMBAR ROM:    AROM eval  Flexion 25% reduced  Extension 75% reduced  Right lateral flexion    Left lateral flexion    Right rotation 50% reduced  Left rotation 50% reduced   (Blank rows = not tested)   LOWER EXTREMITY MMT:     MMT Right eval Left eval  Hip flexion 3+/5 3+/5  Hip extension      Hip abduction 3+/5 3+/5  Hip adduction 3+/5 3+/5  Hip internal rotation      Hip external rotation      Knee flexion 5/5 5/5  Knee extension 5/5 5/5  Ankle dorsiflexion      Ankle plantarflexion      Ankle inversion      Ankle eversion       (Blank rows = not tested)   LUMBAR SPECIAL TESTS:  Straight leg raise test: Negative and Slump test: Negative   FUNCTIONAL TESTS:  30 Second Sit to Stand: 8 reps   GAIT: Distance walked: 98f Assistive device utilized: None Level of assistance: Complete Independence Comments: trunk flexed, antalgic gait   TREATMENT:  [facility] Adult PT Treatment:                                                DATE:  07/24/22:  Therapeutic Exercise: Supine on heat pack with exercises: LTR x20 Attempted supine posterior pelvic tilts, but patient unable to complete due to low back pain and nerve pain down right LE. Supine sciatic nerve glides x10 right LE Standing pallof press with green TB, patient performed 1x10 but unable to perform more due to right elbow pain Seated ball roll outs for lumbar stretch x25.  Manual Therapy: STM to left lumbar paraspinals, patient reports increased radicular pain  Modalities: Lumbar heat pack in supine x10 minutes with LE's elevated on wedge   07/19/2022 Pt seen for aquatic therapy today.  Treatment took place in water 4-4.75 ft in [facility]. Temp of water was 88.  Pt entered/exited the pool via stairs  independently with bilat rail.  * walking forward/ backward without assistance * side stepping  * high knee marching forward/ backward with reciprocal arm swing * holding wall:  heel raises x 20; hip circles CW/ CCW (knee straight) * side step with squat and arm abdct/ add * solid noodle pull down to thighs x 10 * holding  yellow hand floats for support: leg swings forward/backward 10 x 2; hip abdct/ addct 10 x 2 * rainbow hand floats with unilateral shoulder flex /ext (0-90) x 5 each; shoulder abdct/add x 3 (irritated elbow); tricep press down x 5  * L stretch holding wall with gentle wag the tail * seated on thick square noodle: cycling, cc ski and jumping jack LEs with feet lightly touching ground   Pt requires the buoyancy and hydrostatic pressure of water for support, and to offload joints by unweighting joint load by at least 50 % in navel deep water and by at least 75-80% in chest to neck deep water.  Viscosity of the water is needed for resistance of strengthening. Water current perturbations provides challenge to standing balance requiring increased core activation.    [facility] Adult PT Treatment:                                                DATE: 07/11/2022 Therapeutic Exercise: Palloff press 17# 2x10 Lt only, too painful on Rt elbow Cybex hip flexion/extension/abduction 37.5# x10 each BIL Supine marching 2x10 BIL Supine sciatic nerve glide with towel x20 BIL Supine PPT 5" hold x10 Bridges x10, x5 LTR x10 BIL Seated pball roll out forward x8 (painful) 10# KB deadlift from 12" stacked boxes x10 STS holding 10# KB x10 Modalities: Supine exercises performed lying on MHP with towel   PATIENT EDUCATION:  Education details: eval findings, ODI, HEP, POC Person educated: Patient Education method: Explanation, Demonstration, and Handouts Education comprehension: verbalized understanding and returned demonstration   HOME EXERCISE PROGRAM: Access Code: [email or web address] URL:  [email or web address] Date: 07/02/2022 Prepared by: Octavio Manns   Exercises - Supine Posterior Pelvic Tilt  - 2 x daily - 7 x weekly - 2 sets - 10 reps - 5 sec hold - Supine Lower Trunk Rotation  - 2 x daily - 7 x weekly - 2 sets - 10 reps - Supine Bridge  - 2 x daily - 7 x weekly - 2 sets - 10 reps - Seated Lumbar Flexion Stretch  - 2 x daily - 7 x weekly - 2 sets - 10 reps - 5 sec hold   ASSESSMENT:   CLINICAL IMPRESSION: Patient arrived to PT session with 9/10 left low back pain. PT used heat in beginning of session during supine exercises, but heat did not reduce or alleviate his pain. All activities including gentle manual therapy increased his pain, so session was shorted due to decreased tolerance to interventions today.   OBJECTIVE IMPAIRMENTS: decreased activity tolerance, decreased endurance, decreased mobility, difficulty walking, decreased ROM, decreased strength, obesity, and pain.    ACTIVITY LIMITATIONS: carrying, lifting, bending, standing, squatting, stairs, transfers, and bed mobility   PARTICIPATION LIMITATIONS: meal prep, driving, shopping, community activity, and yard work   PERSONAL FACTORS: Fitness, Time since onset of injury/illness/exacerbation, and 1-2 comorbidities: HTN, Depression   are also affecting patient's functional outcome.    REHAB POTENTIAL: Good   CLINICAL DECISION MAKING: Evolving/moderate complexity   EVALUATION COMPLEXITY: Moderate     GOALS: Goals reviewed with patient? No   SHORT TERM GOALS: Target date: 07/23/2022   Pt will be compliant and knowledgeable with initial HEP for improved comfort and carryover Baseline: initial HEP given  Goal status: INITIAL   2.  Pt will self report back pain no greater than 6/10 for improved  comfort and functional ability Baseline: 10/10 at worst Goal status: INITIAL    LONG TERM GOALS: Target date: 08/27/2022   Pt will increase 30 Second Sit to Stand rep count to no less than 10  reps for improved balance, strength, and functional mobility Baseline: 8 reps  Goal status: INITIAL    2.  Pt will be decrease ODI disability score to no greater than 35% as proxy for functional improvement Baseline: 50% disability  Goal status: INITIAL   3.  Pt will self report back pain no greater than 3/10 for improved comfort and functional ability Baseline: 10/10 at worst Goal status: INITIAL    4.  Pt will be able to transfer his cousin for care giving duties with no increase in back pain for improved comfort and functional ability  Baseline: unable Goal status: INITIAL   PLAN:   PT FREQUENCY: 2x/week   PT DURATION: 8 weeks   PLANNED INTERVENTIONS: Therapeutic exercises, Therapeutic activity, Neuromuscular re-education, Balance training, Gait training, Patient/Family education, Self Care, Joint mobilization, Aquatic Therapy, Dry Needling, Electrical stimulation, Cryotherapy, Moist heat, Vasopneumatic device, Manual therapy, and Re-evaluation.   PLAN FOR NEXT SESSION: assess HEP response, core and hip strengthening, progress as able    Edythe Lynn, PT, DPT 07/24/22 1:08 PM

## 2022-07-26 ENCOUNTER — Encounter (HOSPITAL_BASED_OUTPATIENT_CLINIC_OR_DEPARTMENT_OTHER): Payer: Self-pay | Admitting: Family Medicine

## 2022-07-26 ENCOUNTER — Ambulatory Visit (INDEPENDENT_AMBULATORY_CARE_PROVIDER_SITE_OTHER): Payer: Medicaid Other | Admitting: Family Medicine

## 2022-07-26 DIAGNOSIS — M5442 Lumbago with sciatica, left side: Secondary | ICD-10-CM | POA: Diagnosis not present

## 2022-07-26 DIAGNOSIS — G8929 Other chronic pain: Secondary | ICD-10-CM

## 2022-07-26 DIAGNOSIS — M5441 Lumbago with sciatica, right side: Secondary | ICD-10-CM

## 2022-07-26 DIAGNOSIS — M7711 Lateral epicondylitis, right elbow: Secondary | ICD-10-CM | POA: Insufficient documentation

## 2022-07-26 MED ORDER — DICLOFENAC SODIUM 1 % EX GEL: 2.0000 g | Freq: Four times a day (QID) | CUTANEOUS | 11 refills | Status: DC

## 2022-07-26 NOTE — Assessment & Plan Note (Signed)
Since last appointment, patient has developed right elbow pain.  Elbow pain is mostly noted over lateral epicondyle, extending some distally into forearm.  Pain is worse with extending wrist, lifting objects up, particularly if lifting with palm facing down.  Patient is right-handed.  He has not had any associated numbness or tingling.  No prior similar issues. On exam, patient does have some tenderness to palpation over lateral epicondyle, extending some distally in the forearm.  No skin changes noted.  Normal active and passive range of motion for flexion, extension, pronation and supination.  Distal neurovascular exam intact.  Pain reproduced with elbow at full extension and resisted extension of middle finger. 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  Discussed diagnosis with patient, discussed treatment considerations including working with PT, home exercise program as per PT.  Provided handout reviewing condition as well as home exercises to be utilized Will send prescription for Voltaren gel which can be used up to 3-4 times a day on the area

## 2022-07-26 NOTE — Assessment & Plan Note (Signed)
Patient reports that his back pain is about the same as compared to last office visit.  He has been working with physical therapy, engaging in both land-based and aquatic therapy.  He does have upcoming appoint with them again tomorrow.  He has not any change in symptom quality, no new symptoms reported. Recommend continuing with physical therapy, home exercise program as per PT.  Will continue to monitor progress with PT and determine need for further evaluation based on progress

## 2022-07-26 NOTE — Progress Notes (Signed)
    Procedures performed today:    None.  Independent interpretation of notes and tests performed by another provider:   None.  Brief History, Exam, Impression, and Recommendations:    BP 109/65 (BP Location: Left Arm, Patient Position: Sitting, Cuff Size: Large)   Pulse 67   Ht '5\' 11"'$  (1.803 m)   Wt (!) 368 lb 12.8 oz (167.3 kg)   SpO2 98%   BMI 51.44 kg/m   Chronic bilateral low back pain with bilateral sciatica Patient reports that his back pain is about the same as compared to last office visit.  He has been working with physical therapy, engaging in both land-based and aquatic therapy.  He does have upcoming appoint with them again tomorrow.  He has not any change in symptom quality, no new symptoms reported. Recommend continuing with physical therapy, home exercise program as per PT.  Will continue to monitor progress with PT and determine need for further evaluation based on progress  Lateral epicondylitis of right elbow Since last appointment, patient has developed right elbow pain.  Elbow pain is mostly noted over lateral epicondyle, extending some distally into forearm.  Pain is worse with extending wrist, lifting objects up, particularly if lifting with palm facing down.  Patient is right-handed.  He has not had any associated numbness or tingling.  No prior similar issues. On exam, patient does have some tenderness to palpation over lateral epicondyle, extending some distally in the forearm.  No skin changes noted.  Normal active and passive range of motion for flexion, extension, pronation and supination.  Distal neurovascular exam intact.  Pain reproduced with elbow at full extension and resisted extension of middle finger. 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  Discussed diagnosis with patient, discussed treatment considerations including working with PT, home exercise program as per PT.  Provided handout reviewing condition as well as home  exercises to be utilized Will send prescription for Voltaren gel which can be used up to 3-4 times a day on the area  Return in about 2 months (around 09/26/2022).  He will return tomorrow to complete labs   ___________________________________________ Shreya Lacasse de [place], MD, ABFM, [facility] [place]

## 2022-07-27 ENCOUNTER — Ambulatory Visit (HOSPITAL_BASED_OUTPATIENT_CLINIC_OR_DEPARTMENT_OTHER): Payer: Medicaid Other

## 2022-07-27 ENCOUNTER — Encounter (HOSPITAL_BASED_OUTPATIENT_CLINIC_OR_DEPARTMENT_OTHER): Payer: Self-pay | Admitting: Physical Therapy

## 2022-07-27 ENCOUNTER — Ambulatory Visit (HOSPITAL_BASED_OUTPATIENT_CLINIC_OR_DEPARTMENT_OTHER): Payer: Medicaid Other | Admitting: Physical Therapy

## 2022-07-27 DIAGNOSIS — M6281 Muscle weakness (generalized): Secondary | ICD-10-CM

## 2022-07-27 DIAGNOSIS — R2689 Other abnormalities of gait and mobility: Secondary | ICD-10-CM | POA: Diagnosis not present

## 2022-07-27 DIAGNOSIS — I10 Essential (primary) hypertension: Secondary | ICD-10-CM | POA: Diagnosis not present

## 2022-07-27 DIAGNOSIS — M5441 Lumbago with sciatica, right side: Secondary | ICD-10-CM | POA: Diagnosis not present

## 2022-07-27 DIAGNOSIS — M5442 Lumbago with sciatica, left side: Secondary | ICD-10-CM | POA: Diagnosis not present

## 2022-07-27 DIAGNOSIS — M5459 Other low back pain: Secondary | ICD-10-CM

## 2022-07-27 DIAGNOSIS — G8929 Other chronic pain: Secondary | ICD-10-CM | POA: Diagnosis not present

## 2022-07-27 LAB — COMPREHENSIVE METABOLIC PANEL
ALT: 11 IU/L (ref 0–44)
AST: 16 IU/L (ref 0–40)
Albumin/Globulin Ratio: 1.5 (ref 1.2–2.2)
Albumin: 4.1 g/dL (ref 4.1–5.1)
Alkaline Phosphatase: 106 IU/L (ref 44–121)
BUN/Creatinine Ratio: 14 (ref 9–20)
BUN: 12 mg/dL (ref 6–24)
Bilirubin Total: 0.5 mg/dL (ref 0.0–1.2)
CO2: 26 mmol/L (ref 20–29)
Calcium: 8.8 mg/dL (ref 8.7–10.2)
Chloride: 105 mmol/L (ref 96–106)
Creatinine, Ser: 0.88 mg/dL (ref 0.76–1.27)
Globulin, Total: 2.7 g/dL (ref 1.5–4.5)
Glucose: 111 mg/dL — ABNORMAL HIGH (ref 70–99)
Potassium: 4.2 mmol/L (ref 3.5–5.2)
Sodium: 142 mmol/L (ref 134–144)
Total Protein: 6.8 g/dL (ref 6.0–8.5)
eGFR: 106 mL/min/{1.73_m2} (ref >59)

## 2022-07-27 LAB — LIPID PANEL
Chol/HDL Ratio: 3.8 ratio (ref 0.0–5.0)
Cholesterol, Total: 155 mg/dL (ref 100–199)
HDL: 41 mg/dL (ref >39)
LDL Chol Calc (NIH): 91 mg/dL (ref 0–99)
Triglycerides: 128 mg/dL (ref 0–149)
VLDL Cholesterol Cal: 23 mg/dL (ref 5–40)

## 2022-07-27 LAB — HEMOGLOBIN A1C
Est. average glucose Bld gHb Est-mCnc: 111 mg/dL
Hgb A1c MFr Bld: 5.5 % (ref 4.8–5.6)

## 2022-07-27 NOTE — Therapy (Signed)
OUTPATIENT PHYSICAL THERAPY TREATMENT NOTE   Patient Name: Reginald Campbell MRN: [id number] DOB:1976/06/06, 46 y.o., male Today's Date: 07/27/2022  PCP: Tennis Must [place], Blondell Reveal, MD  REFERRING PROVIDER: de [place], Raymond J, MD   END OF SESSION:   PT End of Session - 07/27/22 1228     Visit Number 5    Number of Visits 17    Date for PT Re-Evaluation 08/27/22    PT Start Time 1224    PT Stop Time 1307    PT Time Calculation (min) 43 min    Activity Tolerance Patient tolerated treatment well    Behavior During Therapy [facility] for tasks assessed/performed              Past Medical History:  Diagnosis Date   Arthritis    Asthma    Back pain    BRBPR (bright red blood per rectum) 0/09/2120   Complication of circumcision 01/23/2017   Depression    Family history of adverse reaction to anesthesia    daughter under anesthesia for 6 hours had problems with N/V    Family history of diabetes mellitus 04/07/2015   History of kidney stones    Hypertension    hx of elevated bp, no meds for htn   Lumbar radiculopathy, chronic 10/01/2016   Nephrolithiasis 04/18/2015   Phimosis    Rectal bleeding 03/07/2017   Sleep apnea    does not use cpap due to broke   Past Surgical History:  Procedure Laterality Date   CIRCUMCISION N/A 03/27/2017   Procedure: CIRCUMCISION ADULT with canal block;  Surgeon: Alexis Frock, MD;  Location: [facility];  Service: Urology;  Laterality: N/A;   CIRCUMCISION REVISION N/A 10/04/2017   Procedure: CIRCUMCISION REVISION;  Surgeon: Alexis Frock, MD;  Location: [facility];  Service: Urology;  Laterality: N/A;   COLONOSCOPY WITH PROPOFOL N/A 02/23/2021   Procedure: COLONOSCOPY WITH PROPOFOL;  Surgeon: Lavena Bullion, DO;  Location: [facility];  Service: Gastroenterology;  Laterality: N/A;   NO PAST SURGERIES     POLYPECTOMY  02/23/2021   Procedure: POLYPECTOMY;  Surgeon: Lavena Bullion, DO;  Location: [facility];  Service: Gastroenterology;;   Patient Active Problem  List   Diagnosis Date Noted   Lateral epicondylitis of right elbow 07/26/2022   Adenomatous polyp of ascending colon    Polyp of sigmoid colon    Grade III internal hemorrhoids    Screening for colorectal cancer 08/31/2020   Achilles tendonitis, bilateral 05/20/2020   Atypical mole 03/12/2019   HTN (hypertension) 10/22/2018   Chronic bilateral low back pain with bilateral sciatica 04/09/2018   Internal hemorrhoids 03/08/2017   Morbid obesity ([place]) 10/02/2016   MDD (major depressive disorder) 10/01/2016   Bilateral shoulder pain 08/18/2015   Healthcare maintenance 04/07/2015   Obstructive sleep apnea 04/07/2015    REFERRING DIAG: M54.42,M54.41,G89.29 (ICD-10-CM) - Chronic bilateral low back pain with bilateral sciatica    THERAPY DIAG:  Other low back pain  Muscle weakness (generalized)  Other abnormalities of gait and mobility  Rationale for Evaluation and Treatment Rehabilitation  PERTINENT HISTORY: HTN, Depression    PRECAUTIONS: None  SUBJECTIVE:  SUBJECTIVE STATEMENT:  "I saw Dr yesterday. He says I have tennis elbow. He prescribed an ointment to rub on my elbow".  Pt reports he had no ill effects from last aquatic session.   PAIN:  Are you having pain? yes NPRS scale: 3/10 Pain location: left lower back Pain description: achy Aggravating factors: lifting, occasional assisting with mobility/transfers of his cousin (no lifting), prolonged postioning   Relieving factors: none   OBJECTIVE: (objective measures completed at initial evaluation unless otherwise dated)   DIAGNOSTIC FINDINGS:  See imaging    PATIENT SURVEYS:  ODI: 50% disability   COGNITION: Overall cognitive status: Within functional limits for tasks assessed               SENSATION: WFL   POSTURE: rounded shoulders,  forward head, and increased lumbar lordosis; larger body habitus    PALPATION: TTP to bilateral lumbar paraspinals; R>L   LUMBAR ROM:    AROM eval  Flexion 25% reduced  Extension 75% reduced  Right lateral flexion    Left lateral flexion    Right rotation 50% reduced  Left rotation 50% reduced   (Blank rows = not tested)   LOWER EXTREMITY MMT:     MMT Right eval Left eval  Hip flexion 3+/5 3+/5  Hip extension      Hip abduction 3+/5 3+/5  Hip adduction 3+/5 3+/5  Hip internal rotation      Hip external rotation      Knee flexion 5/5 5/5  Knee extension 5/5 5/5  Ankle dorsiflexion      Ankle plantarflexion      Ankle inversion      Ankle eversion       (Blank rows = not tested)   LUMBAR SPECIAL TESTS:  Straight leg raise test: Negative and Slump test: Negative   FUNCTIONAL TESTS:  30 Second Sit to Stand: 8 reps   GAIT: Distance walked: 67f Assistive device utilized: None Level of assistance: Complete Independence Comments: trunk flexed, antalgic gait   TREATMENT: [facility] Adult PT Treatment:                                                DATE: 07/27/2022 Pt seen for aquatic therapy today.  Treatment took place in water 4-4.75 ft [facility] Temp of water was 88.  Pt entered/exited the pool via stairs independently with bilat rail. * on pool deck- Rt wrist extensor stretch x 10-15 sec x 2 * walking forward/ backward with reciprocal arm swing 5 laps * side stepping with arm abdct/ addct  * holding yellow noodle:  hip abdct/ addct 10 x 2;  leg swings forward/backward 10 * L stretch holding wall, with/without gentle wag the tail x 3 reps (improved tolerance) * high knee marching forward/ backward with reciprocal hand taps to knee (with/without rainbow hand floats) * holding wall: heel raises x 10; hip circles CW/ CCW (knee straight) * side step with squat and arm abdct/ add * seated on thick square noodle: cycling (with blue hand  floats under water), cc ski and reverse jumping jack LEs with feet lightly touching ground  * L stretch holding wall, with/without gentle wag the tail x 3 reps (improved tolerance) * holding wall: hip openers x 10 (hip abdct/ knee flexion)    Pt requires the buoyancy and hydrostatic pressure  of water for support, and to offload joints by unweighting joint load by at least 50 % in navel deep water and by at least 75-80% in chest to neck deep water.  Viscosity of the water is needed for resistance of strengthening. Water current perturbations provides challenge to standing balance requiring increased core activation. [facility] Adult PT Treatment:                                                DATE:  07/24/22:  Therapeutic Exercise: Supine on heat pack with exercises: LTR x20 Attempted supine posterior pelvic tilts, but patient unable to complete due to low back pain and nerve pain down right LE. Supine sciatic nerve glides x10 right LE Standing pallof press with green TB, patient performed 1x10 but unable to perform more due to right elbow pain Seated ball roll outs for lumbar stretch x25.  Manual Therapy: STM to left lumbar paraspinals, patient reports increased radicular pain  Modalities: Lumbar heat pack in supine x10 minutes with LE's elevated on wedge   07/19/2022 Pt seen for aquatic therapy today.  Treatment took place in water 4-4.75 ft in depth at the [employer] pool. Temp of water was 88.  Pt entered/exited the pool via stairs independently with bilat rail.  * walking forward/ backward without assistance * side stepping  * high knee marching forward/ backward with reciprocal arm swing * holding wall:  heel raises x 20; hip circles CW/ CCW (knee straight) * side step with squat and arm abdct/ add * solid noodle pull down to thighs x 10 * holding yellow hand floats for support: leg swings forward/backward 10 x 2; hip abdct/ addct 10 x 2 * rainbow hand floats with  unilateral shoulder flex /ext (0-90) x 5 each; shoulder abdct/add x 3 (irritated elbow); tricep press down x 5  * L stretch holding wall with gentle wag the tail * seated on thick square noodle: cycling, cc ski and jumping jack LEs with feet lightly touching ground   Pt requires the buoyancy and hydrostatic pressure of water for support, and to offload joints by unweighting joint load by at least 50 % in navel deep water and by at least 75-80% in chest to neck deep water.  Viscosity of the water is needed for resistance of strengthening. Water current perturbations provides challenge to standing balance requiring increased core activation.    [facility] Adult PT Treatment:                                                DATE: 07/11/2022 Therapeutic Exercise: Palloff press 17# 2x10 Lt only, too painful on Rt elbow Cybex hip flexion/extension/abduction 37.5# x10 each BIL Supine marching 2x10 BIL Supine sciatic nerve glide with towel x20 BIL Supine PPT 5" hold x10 Bridges x10, x5 LTR x10 BIL Seated pball roll out forward x8 (painful) 10# KB deadlift from 12" stacked boxes x10 STS holding 10# KB x10 Modalities: Supine exercises performed lying on MHP with towel   PATIENT EDUCATION:  Education details: eval findings, ODI, HEP, POC Person educated: Patient Education method: Explanation, Demonstration, and Handouts Education comprehension: verbalized understanding and returned demonstration   HOME EXERCISE PROGRAM: Access Code: [email or web address] URL: [email or web address] Date: 07/02/2022  Prepared by: Octavio Manns   Exercises - Supine Posterior Pelvic Tilt  - 2 x daily - 7 x weekly - 2 sets - 10 reps - 5 sec hold - Supine Lower Trunk Rotation  - 2 x daily - 7 x weekly - 2 sets - 10 reps - Supine Bridge  - 2 x daily - 7 x weekly - 2 sets - 10 reps - Seated Lumbar Flexion Stretch  - 2 x daily - 7 x weekly - 2 sets - 10 reps - 5 sec hold   ASSESSMENT:   CLINICAL IMPRESSION: Pt  reported increased pain/pressure in lower back with reverse jumping jacks. Encouraged pt to speak up regarding increased pain during exercises so they can be modified.  "L" stretch tolerated very well today and provided relief; repeated later in session.  Avoided excessive resisted UE work with floatation devices as this irritated his Lt elbow last aquatic visit.  He reported improvement in Lt elbow pain while in the water. Overall, good exercise tolerance in the water.    OBJECTIVE IMPAIRMENTS: decreased activity tolerance, decreased endurance, decreased mobility, difficulty walking, decreased ROM, decreased strength, obesity, and pain.    ACTIVITY LIMITATIONS: carrying, lifting, bending, standing, squatting, stairs, transfers, and bed mobility   PARTICIPATION LIMITATIONS: meal prep, driving, shopping, community activity, and yard work   PERSONAL FACTORS: Fitness, Time since onset of injury/illness/exacerbation, and 1-2 comorbidities: HTN, Depression   are also affecting patient's functional outcome.    REHAB POTENTIAL: Good   CLINICAL DECISION MAKING: Evolving/moderate complexity   EVALUATION COMPLEXITY: Moderate     GOALS: Goals reviewed with patient? No   SHORT TERM GOALS: Target date: 07/23/2022   Pt will be compliant and knowledgeable with initial HEP for improved comfort and carryover Baseline: initial HEP given  Goal status: INITIAL   2.  Pt will self report back pain no greater than 6/10 for improved comfort and functional ability Baseline: 10/10 at worst Goal status: INITIAL    LONG TERM GOALS: Target date: 08/27/2022   Pt will increase 30 Second Sit to Stand rep count to no less than 10 reps for improved balance, strength, and functional mobility Baseline: 8 reps  Goal status: INITIAL    2.  Pt will be decrease ODI disability score to no greater than 35% as proxy for functional improvement Baseline: 50% disability  Goal status: INITIAL   3.  Pt will self report back  pain no greater than 3/10 for improved comfort and functional ability Baseline: 10/10 at worst Goal status: INITIAL    4.  Pt will be able to transfer his cousin for care giving duties with no increase in back pain for improved comfort and functional ability  Baseline: unable Goal status: INITIAL   PLAN:   PT FREQUENCY: 2x/week   PT DURATION: 8 weeks   PLANNED INTERVENTIONS: Therapeutic exercises, Therapeutic activity, Neuromuscular re-education, Balance training, Gait training, Patient/Family education, Self Care, Joint mobilization, Aquatic Therapy, Dry Needling, Electrical stimulation, Cryotherapy, Moist heat, Vasopneumatic device, Manual therapy, and Re-evaluation.   PLAN FOR NEXT SESSION: assess HEP response, core and hip strengthening, progress as able    Kerin Perna, PTA 07/27/22 1:13 PM [facility] [address] [place], [place], [address] Phone: [phone]   Fax:  [phone]

## 2022-07-31 ENCOUNTER — Ambulatory Visit: Payer: Medicaid Other | Attending: Family Medicine | Admitting: Physical Therapy

## 2022-07-31 ENCOUNTER — Encounter: Payer: Self-pay | Admitting: Physical Therapy

## 2022-07-31 DIAGNOSIS — M6281 Muscle weakness (generalized): Secondary | ICD-10-CM | POA: Diagnosis present

## 2022-07-31 DIAGNOSIS — M25521 Pain in right elbow: Secondary | ICD-10-CM | POA: Diagnosis present

## 2022-07-31 DIAGNOSIS — M5459 Other low back pain: Secondary | ICD-10-CM | POA: Diagnosis present

## 2022-07-31 DIAGNOSIS — R2689 Other abnormalities of gait and mobility: Secondary | ICD-10-CM | POA: Diagnosis present

## 2022-07-31 NOTE — Therapy (Signed)
OUTPATIENT PHYSICAL THERAPY TREATMENT NOTE   Patient Name: Reginald Campbell MRN: [id number] DOB:1976/06/07, 47 y.o., male Today's Date: 07/31/2022  PCP: Tennis Must [place], Blondell Reveal, MD  REFERRING PROVIDER: de [place], Raymond J, MD   END OF SESSION:   PT End of Session - 07/31/22 1250     Visit Number 6    Number of Visits 17    Date for PT Re-Evaluation 08/27/22    PT Start Time 1250    PT Stop Time 1330    PT Time Calculation (min) 40 min    Activity Tolerance Patient tolerated treatment well    Behavior During Therapy [facility] for tasks assessed/performed              Past Medical History:  Diagnosis Date   Arthritis    Asthma    Back pain    BRBPR (bright red blood per rectum) 9/0/2111   Complication of circumcision 01/23/2017   Depression    Family history of adverse reaction to anesthesia    daughter under anesthesia for 6 hours had problems with N/V    Family history of diabetes mellitus 04/07/2015   History of kidney stones    Hypertension    hx of elevated bp, no meds for htn   Lumbar radiculopathy, chronic 10/01/2016   Nephrolithiasis 04/18/2015   Phimosis    Rectal bleeding 03/07/2017   Sleep apnea    does not use cpap due to broke   Past Surgical History:  Procedure Laterality Date   CIRCUMCISION N/A 03/27/2017   Procedure: CIRCUMCISION ADULT with canal block;  Surgeon: Alexis Frock, MD;  Location: [facility];  Service: Urology;  Laterality: N/A;   CIRCUMCISION REVISION N/A 10/04/2017   Procedure: CIRCUMCISION REVISION;  Surgeon: Alexis Frock, MD;  Location: [facility];  Service: Urology;  Laterality: N/A;   COLONOSCOPY WITH PROPOFOL N/A 02/23/2021   Procedure: COLONOSCOPY WITH PROPOFOL;  Surgeon: Lavena Bullion, DO;  Location: [facility];  Service: Gastroenterology;  Laterality: N/A;   NO PAST SURGERIES     POLYPECTOMY  02/23/2021   Procedure: POLYPECTOMY;  Surgeon: Lavena Bullion, DO;  [facility]: [facility];  Service: Gastroenterology;;   Patient Active Problem  List   Diagnosis Date Noted   Lateral epicondylitis of right elbow 07/26/2022   Adenomatous polyp of ascending colon    Polyp of sigmoid colon    Grade III internal hemorrhoids    Screening for colorectal cancer 08/31/2020   Achilles tendonitis, bilateral 05/20/2020   Atypical mole 03/12/2019   HTN (hypertension) 10/22/2018   Chronic bilateral low back pain with bilateral sciatica 04/09/2018   Internal hemorrhoids 03/08/2017   Morbid obesity ([place]) 10/02/2016   MDD (major depressive disorder) 10/01/2016   Bilateral shoulder pain 08/18/2015   Healthcare maintenance 04/07/2015   Obstructive sleep apnea 04/07/2015    REFERRING DIAG: M54.42,M54.41,G89.29 (ICD-10-CM) - Chronic bilateral low back pain with bilateral sciatica    THERAPY DIAG:  Other low back pain  Other abnormalities of gait and mobility  Muscle weakness (generalized)  Rationale for Evaluation and Treatment Rehabilitation  PERTINENT HISTORY: HTN, Depression    PRECAUTIONS: None  SUBJECTIVE:  SUBJECTIVE STATEMENT:  Pt states that he is feeling much better than his last land visit.  He feels aquatic therapy is helpful  PAIN:  Are you having pain? yes NPRS scale: 5/10 Pain location: left lower back Pain description: achy Aggravating factors: lifting, occasional assisting with mobility/transfers of his cousin (no lifting), prolonged postioning   Relieving factors: none   OBJECTIVE: (objective measures completed at initial evaluation unless otherwise dated)   DIAGNOSTIC FINDINGS:  See imaging    PATIENT SURVEYS:  ODI: 50% disability   COGNITION: Overall cognitive status: Within functional limits for tasks assessed               SENSATION: WFL   POSTURE: rounded shoulders, forward head, and increased lumbar lordosis; larger  body habitus    PALPATION: TTP to bilateral lumbar paraspinals; R>L   LUMBAR ROM:    AROM eval  Flexion 25% reduced  Extension 75% reduced  Right lateral flexion    Left lateral flexion    Right rotation 50% reduced  Left rotation 50% reduced   (Blank rows = not tested)   LOWER EXTREMITY MMT:     MMT Right eval Left eval  Hip flexion 3+/5 3+/5  Hip extension      Hip abduction 3+/5 3+/5  Hip adduction 3+/5 3+/5  Hip internal rotation      Hip external rotation      Knee flexion 5/5 5/5  Knee extension 5/5 5/5  Ankle dorsiflexion      Ankle plantarflexion      Ankle inversion      Ankle eversion       (Blank rows = not tested)   LUMBAR SPECIAL TESTS:  Straight leg raise test: Negative and Slump test: Negative   FUNCTIONAL TESTS:  30 Second Sit to Stand: 8 reps   GAIT: Distance walked: 66f Assistive device utilized: None Level of assistance: Complete Independence Comments: trunk flexed, antalgic gait   TREATMENT:  [facility] Adult PT Treatment:                                                DATE: 07/31/2021 Therapeutic Exercise: Nu-step L5 567mhile taking subjective and planning session with patient Palloff press 13# - 2x10 ea Downward chop - 13# - 2x10 ea Supine marching 2x10 BIL Supine sciatic nerve glide with towel x5 BIL Supine PPT 5" hold x10 Bridges x10, x5 LTR x10 BIL Alternating Blue TB clam - 2x10 Hip adduction ball squeeze - x5  Manual Therapy - CPA and UPA, pt in sitting with upper body supported, G II - STM bil QL  Modalities: Supine exercises performed lying on MHP with towel   07/19/2022 Pt seen for aquatic therapy today.  Treatment took place in [facility]-4.[facility] Temp of water was 88.  Pt entered/exited the pool via stairs independently with bilat rail.  * walking forward/ backward without assistance * side stepping  * high knee marching forward/ backward with reciprocal arm swing * holding  wall:  heel raises x 20; hip circles CW/ CCW (knee straight) * side step with squat and arm abdct/ add * solid noodle pull down to thighs x 10 * holding yellow hand floats for support: leg swings forward/backward 10 x 2; hip abdct/ addct 10 x 2 * rainbow hand floats with unilateral shoulder  flex /ext (0-90) x 5 each; shoulder abdct/add x 3 (irritated elbow); tricep press down x 5  * L stretch holding wall with gentle wag the tail * seated on thick square noodle: cycling, cc ski and jumping jack LEs with feet lightly touching ground   Pt requires the buoyancy and hydrostatic pressure of water for support, and to offload joints by unweighting joint load by at least 50 % in navel deep water and by at least 75-80% in chest to neck deep water.  Viscosity of the water is needed for resistance of strengthening. Water current perturbations provides challenge to standing balance requiring increased core activation.    [place] Adult PT Treatment:                                                DATE: 07/11/2022 Therapeutic Exercise: Palloff press 17# 2x10 Lt only, too painful on Rt elbow Cybex hip flexion/extension/abduction 37.5# x10 each BIL Supine marching 2x10 BIL Supine sciatic nerve glide with towel x20 BIL Supine PPT 5" hold x10 Bridges x10, x5 LTR x10 BIL Seated pball roll out forward x8 (painful) 10# KB deadlift from 12" stacked boxes x10 STS holding 10# KB x10 Modalities: Supine exercises performed lying on MHP with towel   PATIENT EDUCATION:  Education details: eval findings, ODI, HEP, POC Person educated: Patient Education method: Explanation, Demonstration, and Handouts Education comprehension: verbalized understanding and returned demonstration   HOME EXERCISE PROGRAM: Access Code: [email or web address] URL: [email or web address] Date: 07/02/2022 Prepared by: Octavio Manns   Exercises - Supine Posterior Pelvic Tilt  - 2 x daily - 7 x weekly - 2 sets - 10 reps - 5 sec hold -  Supine Lower Trunk Rotation  - 2 x daily - 7 x weekly - 2 sets - 10 reps - Supine Bridge  - 2 x daily - 7 x weekly - 2 sets - 10 reps - Seated Lumbar Flexion Stretch  - 2 x daily - 7 x weekly - 2 sets - 10 reps - 5 sec hold   ASSESSMENT:   CLINICAL IMPRESSION: Reginald Campbell tolerated session well with no adverse reaction.  Pt remains significantly limited by pain but is able to complete higher volume and intensity of exercise compared to previous land visit.  We concentrated on basic hip and core strengthening on mat for comfort but pt requested we stop multiple times for rest d/t pain.  Manual therapy was not effective.  He may do better in the aquatic environment in the short/medium term.   OBJECTIVE IMPAIRMENTS: decreased activity tolerance, decreased endurance, decreased mobility, difficulty walking, decreased ROM, decreased strength, obesity, and pain.    ACTIVITY LIMITATIONS: carrying, lifting, bending, standing, squatting, stairs, transfers, and bed mobility   PARTICIPATION LIMITATIONS: meal prep, driving, shopping, community activity, and yard work   PERSONAL FACTORS: Fitness, Time since onset of injury/illness/exacerbation, and 1-2 comorbidities: HTN, Depression   are also affecting patient's functional outcome.    REHAB POTENTIAL: Good   CLINICAL DECISION MAKING: Evolving/moderate complexity   EVALUATION COMPLEXITY: Moderate     GOALS: Goals reviewed with patient? No   SHORT TERM GOALS: Target date: 07/23/2022   Pt will be compliant and knowledgeable with initial HEP for improved comfort and carryover Baseline: initial HEP given  Goal status: Partially MET   2.  Pt will self report back pain no greater than 6/10 for  improved comfort and functional ability Baseline: 10/10 at worst Goal status: NOT MET   LONG TERM GOALS: Target date: 08/27/2022   Pt will increase 30 Second Sit to Stand rep count to no less than 10 reps for improved balance, strength, and functional  mobility Baseline: 8 reps  Goal status: INITIAL    2.  Pt will be decrease ODI disability score to no greater than 35% as proxy for functional improvement Baseline: 50% disability  Goal status: INITIAL   3.  Pt will self report back pain no greater than 3/10 for improved comfort and functional ability Baseline: 10/10 at worst Goal status: INITIAL    4.  Pt will be able to transfer his cousin for care giving duties with no increase in back pain for improved comfort and functional ability  Baseline: unable Goal status: INITIAL   PLAN:   PT FREQUENCY: 2x/week   PT DURATION: 8 weeks   PLANNED INTERVENTIONS: Therapeutic exercises, Therapeutic activity, Neuromuscular re-education, Balance training, Gait training, Patient/Family education, Self Care, Joint mobilization, Aquatic Therapy, Dry Needling, Electrical stimulation, Cryotherapy, Moist heat, Vasopneumatic device, Manual therapy, and Re-evaluation.   PLAN FOR NEXT SESSION: assess HEP response, core and hip strengthening, progress as able    Reginald Campbell PT 07/31/22 1:42 PM

## 2022-08-02 ENCOUNTER — Encounter (HOSPITAL_BASED_OUTPATIENT_CLINIC_OR_DEPARTMENT_OTHER): Payer: Self-pay | Admitting: Physical Therapy

## 2022-08-02 ENCOUNTER — Ambulatory Visit (HOSPITAL_BASED_OUTPATIENT_CLINIC_OR_DEPARTMENT_OTHER): Payer: Medicaid Other | Attending: Family Medicine | Admitting: Physical Therapy

## 2022-08-02 DIAGNOSIS — R2689 Other abnormalities of gait and mobility: Secondary | ICD-10-CM | POA: Insufficient documentation

## 2022-08-02 DIAGNOSIS — M6281 Muscle weakness (generalized): Secondary | ICD-10-CM | POA: Diagnosis not present

## 2022-08-02 DIAGNOSIS — M5459 Other low back pain: Secondary | ICD-10-CM | POA: Diagnosis not present

## 2022-08-02 DIAGNOSIS — M25521 Pain in right elbow: Secondary | ICD-10-CM | POA: Diagnosis not present

## 2022-08-02 NOTE — Therapy (Signed)
OUTPATIENT PHYSICAL THERAPY TREATMENT NOTE   Patient Name: Reginald Campbell MRN: [id number] DOB:1976/03/03, 47 y.o., male Today's Date: 08/02/2022  PCP: de [place], Raymond J, MD  REFERRING PROVIDER: [place], Raymond J, MD   END OF SESSION:   PT End of Session - 08/02/22 1125     Visit Number 7    Number of Visits 17    Date for PT Re-Evaluation 08/27/22    Authorization Type [employer]    Authorization Time Period 6 visits 07/09/22-09/03/22    PT Start Time 1115    PT Stop Time 1155    PT Time Calculation (min) 40 min    Activity Tolerance --    Behavior During Therapy [facility] for tasks assessed/performed              Past Medical History:  Diagnosis Date   Arthritis    Asthma    Back pain    BRBPR (bright red blood per rectum) 4/0/9811   Complication of circumcision 01/23/2017   Depression    Family history of adverse reaction to anesthesia    daughter under anesthesia for 6 hours had problems with N/V    Family history of diabetes mellitus 04/07/2015   History of kidney stones    Hypertension    hx of elevated bp, no meds for htn   Lumbar radiculopathy, chronic 10/01/2016   Nephrolithiasis 04/18/2015   Phimosis    Rectal bleeding 03/07/2017   Sleep apnea    does not use cpap due to broke   Past Surgical History:  Procedure Laterality Date   CIRCUMCISION N/A 03/27/2017   Procedure: CIRCUMCISION ADULT with canal block;  Surgeon: Alexis Frock, MD;  Location: [facility];  Service: Urology;  Laterality: N/A;   CIRCUMCISION REVISION N/A 10/04/2017   Procedure: CIRCUMCISION REVISION;  Surgeon: Alexis Frock, MD;  Location: [facility];  Service: Urology;  Laterality: N/A;   COLONOSCOPY WITH PROPOFOL N/A 02/23/2021   Procedure: COLONOSCOPY WITH PROPOFOL;  Surgeon: Lavena Bullion, DO;  Location: [facility];  Service: Gastroenterology;  Laterality: N/A;   NO PAST SURGERIES     POLYPECTOMY  02/23/2021   Procedure: POLYPECTOMY;  Surgeon: Lavena Bullion, DO;  Location: [facility];  Service: Gastroenterology;;   Patient Active Problem List   Diagnosis Date Noted   Lateral epicondylitis of right elbow 07/26/2022   Adenomatous polyp of ascending colon    Polyp of sigmoid colon    Grade III internal hemorrhoids    Screening for colorectal cancer 08/31/2020   Achilles tendonitis, bilateral 05/20/2020   Atypical mole 03/12/2019   HTN (hypertension) 10/22/2018   Chronic bilateral low back pain with bilateral sciatica 04/09/2018   Internal hemorrhoids 03/08/2017   Morbid obesity ([place]) 10/02/2016   MDD (major depressive disorder) 10/01/2016   Bilateral shoulder pain 08/18/2015   Healthcare maintenance 04/07/2015   Obstructive sleep apnea 04/07/2015    REFERRING DIAG: M54.42,M54.41,G89.29 (ICD-10-CM) - Chronic bilateral low back pain with bilateral sciatica    THERAPY DIAG:  Other low back pain  Other abnormalities of gait and mobility  Muscle weakness (generalized)  Rationale for Evaluation and Treatment Rehabilitation  PERTINENT HISTORY: HTN, Depression    PRECAUTIONS: None  SUBJECTIVE:  SUBJECTIVE STATEMENT:  Pt states that he did pretty well after last land visit.  "They (HEP) seem to be helping".   PAIN:  Are you having pain? yes NPRS scale: 2/10 Pain location: left lower back Pain description: achy Aggravating factors: lifting, occasional assisting with mobility/transfers of his cousin (no lifting), prolonged postioning   Relieving factors: none   OBJECTIVE: (objective measures completed at initial evaluation unless otherwise dated)   DIAGNOSTIC FINDINGS:  See imaging    PATIENT SURVEYS:  ODI: 50% disability   COGNITION: Overall cognitive status: Within functional limits for tasks assessed               SENSATION: WFL   POSTURE: rounded shoulders,  forward head, and increased lumbar lordosis; larger body habitus    PALPATION: TTP to bilateral lumbar paraspinals; R>L   LUMBAR ROM:    AROM eval  Flexion 25% reduced  Extension 75% reduced  Right lateral flexion    Left lateral flexion    Right rotation 50% reduced  Left rotation 50% reduced   (Blank rows = not tested)   LOWER EXTREMITY MMT:     MMT Right eval Left eval  Hip flexion 3+/5 3+/5  Hip extension      Hip abduction 3+/5 3+/5  Hip adduction 3+/5 3+/5  Hip internal rotation      Hip external rotation      Knee flexion 5/5 5/5  Knee extension 5/5 5/5  Ankle dorsiflexion      Ankle plantarflexion      Ankle inversion      Ankle eversion       (Blank rows = not tested)   LUMBAR SPECIAL TESTS:  Straight leg raise test: Negative and Slump test: Negative   FUNCTIONAL TESTS:  30 Second Sit to Stand: 8 reps   GAIT: Distance walked: 63f Assistive device utilized: None Level of assistance: Complete Independence Comments: trunk flexed, antalgic gait   TREATMENT:  08/02/2022 Pt seen for aquatic therapy today.  Treatment took place in water 4-4.75 ft [facility] Temp of water was 88.  Pt entered/exited the pool via stairs independently with bilat rail.  * without UE support:  walking forward/ backward; high knee marching with reciprocal arm swing * side squat R/L with arm abdct/ add with yellow -> rainbow hand floats 2 laps * holding rainbow hand floats at surface:  leg swings in sagittal plane x 10 x 2; leg swings in frontal plane (crossing midline) x 10 x 2; heel raises x 20 * solid noodle pull down to thighs x 10 ( 4 count eccentric raising to surface) * SLS with thin squoodle push down with LE, no UE support x 10 each x 2 * balance on thin squoodle with squats x 10  * L stretch holding wall x 4 reps * wood chop holding small yellow ball x 3 reps (high R, low L) - stopped due to R low back pain with R rotation.  * return to  walking backward for recovery * 148m in hot tub (unbilled, for massage with jet to R LB)   Pt requires the buoyancy and hydrostatic pressure of water for support, and to offload joints by unweighting joint load by at least 50 % in navel deep water and by at least 75-80% in chest to neck deep water.  Viscosity of the water is needed for resistance of strengthening. Water current perturbations provides challenge to standing balance requiring increased core activation.  [facility] Adult PT Treatment:                                                DATE: 07/31/2021 Therapeutic Exercise: Nu-step L5 23mwhile taking subjective and planning session with patient Palloff press 13# - 2x10 ea Downward chop - 13# - 2x10 ea Supine marching 2x10 BIL Supine sciatic nerve glide with towel x5 BIL Supine PPT 5" hold x10 Bridges x10, x5 LTR x10 BIL Alternating Blue TB clam - 2x10 Hip adduction ball squeeze - x5  Manual Therapy - CPA and UPA, pt in sitting with upper body supported, G II - STM bil QL  Modalities: Supine exercises performed lying on MHP with towel   07/19/2022 Pt seen for aquatic therapy today.  Treatment took place in water 4-4.75 [facility] Temp of [facility] was 88.  Pt entered/exited the pool via stairs independently with bilat rail.  * walking forward/ backward without assistance * side stepping  * high knee marching forward/ backward with reciprocal arm swing * holding wall:  heel raises x 20; hip circles CW/ CCW (knee straight) * side step with squat and arm abdct/ add * solid noodle pull down to thighs x 10 * holding yellow hand floats for support: leg swings forward/backward 10 x 2; hip abdct/ addct 10 x 2 * rainbow hand floats with unilateral shoulder flex /ext (0-90) x 5 each; shoulder abdct/add x 3 (irritated elbow); tricep press down x 5  * L stretch holding wall with gentle wag the tail * seated on thick square noodle: cycling, cc ski and jumping  jack LEs with feet lightly touching ground   Pt requires the buoyancy and hydrostatic pressure of water for support, and to offload joints by unweighting joint load by at least 50 % in navel deep water and by at least 75-80% in chest to neck deep water.  Viscosity of the water is needed for resistance of strengthening. Water current perturbations provides challenge to standing balance requiring increased core activation.    [facility] PT Treatment:                                                DATE: 07/11/2022 Therapeutic Exercise: Palloff press 17# 2x10 Lt only, too painful on Rt elbow Cybex hip flexion/extension/abduction 37.5# x10 each BIL Supine marching 2x10 BIL Supine sciatic nerve glide with towel x20 BIL Supine PPT 5" hold x10 Bridges x10, x5 LTR x10 BIL Seated pball roll out forward x8 (painful) 10# KB deadlift from 12" stacked boxes x10 STS holding 10# KB x10 Modalities: Supine exercises performed lying on MHP with towel   PATIENT EDUCATION:  Education details: eval findings, ODI, HEP, POC Person educated: Patient Education method: Explanation, Demonstration, and Handouts Education comprehension: verbalized understanding and returned demonstration   HOME EXERCISE PROGRAM: Access Code: [email or web address] URL: [email or web address] Date: 07/02/2022 Prepared by: DOctavio Manns  Exercises - Supine Posterior Pelvic Tilt  - 2 x daily - 7 x weekly - 2 sets - 10 reps - 5 sec hold - Supine Lower Trunk Rotation  - 2 x daily - 7 x weekly - 2 sets - 10 reps - Supine Bridge  - 2 x  daily - 7 x weekly - 2 sets - 10 reps - Seated Lumbar Flexion Stretch  - 2 x daily - 7 x weekly - 2 sets - 10 reps - 5 sec hold   ASSESSMENT:   CLINICAL IMPRESSION: Allen tolerated session well with no adverse reaction --until rotation with wood chop with introduced and he over-extended to the R and his Rt LB went into spasm.  This happened at end of session.  Pain was reported at 9/10, reduced  with standing on his LLE.  Encouraged self care after session to reduce pain to previous level.  Pt will benefit from continued skilled PT intervention to maximize mobility with less pain.    OBJECTIVE IMPAIRMENTS: decreased activity tolerance, decreased endurance, decreased mobility, difficulty walking, decreased ROM, decreased strength, obesity, and pain.    ACTIVITY LIMITATIONS: carrying, lifting, bending, standing, squatting, stairs, transfers, and bed mobility   PARTICIPATION LIMITATIONS: meal prep, driving, shopping, community activity, and yard work   PERSONAL FACTORS: Fitness, Time since onset of injury/illness/exacerbation, and 1-2 comorbidities: HTN, Depression   are also affecting patient's functional outcome.    REHAB POTENTIAL: Good   CLINICAL DECISION MAKING: Evolving/moderate complexity   EVALUATION COMPLEXITY: Moderate     GOALS: Goals reviewed with patient? No   SHORT TERM GOALS: Target date: 07/23/2022   Pt will be compliant and knowledgeable with initial HEP for improved comfort and carryover Baseline: initial HEP given  Goal status: Partially MET   2.  Pt will self report back pain no greater than 6/10 for improved comfort and functional ability Baseline: 10/10 at worst Goal status: NOT MET   LONG TERM GOALS: Target date: 08/27/2022   Pt will increase 30 Second Sit to Stand rep count to no less than 10 reps for improved balance, strength, and functional mobility Baseline: 8 reps  Goal status: INITIAL    2.  Pt will be decrease ODI disability score to no greater than 35% as proxy for functional improvement Baseline: 50% disability  Goal status: INITIAL   3.  Pt will self report back pain no greater than 3/10 for improved comfort and functional ability Baseline: 10/10 at worst Goal status: INITIAL    4.  Pt will be able to transfer his cousin for care giving duties with no increase in back pain for improved comfort and functional ability  Baseline:  unable Goal status: INITIAL   PLAN:   PT FREQUENCY: 2x/week   PT DURATION: 8 weeks   PLANNED INTERVENTIONS: Therapeutic exercises, Therapeutic activity, Neuromuscular re-education, Balance training, Gait training, Patient/Family education, Self Care, Joint mobilization, Aquatic Therapy, Dry Needling, Electrical stimulation, Cryotherapy, Moist heat, Vasopneumatic device, Manual therapy, and Re-evaluation.   PLAN FOR NEXT SESSION: assess HEP response, core and hip strengthening, progress as able    Kerin Perna, PTA 08/02/22 2:26 PM [facility] [address] [place], [place], [address] Phone: [phone]   Fax:  [phone]

## 2022-08-09 ENCOUNTER — Ambulatory Visit: Payer: Medicaid Other

## 2022-08-09 DIAGNOSIS — M6281 Muscle weakness (generalized): Secondary | ICD-10-CM

## 2022-08-09 DIAGNOSIS — R2689 Other abnormalities of gait and mobility: Secondary | ICD-10-CM

## 2022-08-09 DIAGNOSIS — M5459 Other low back pain: Secondary | ICD-10-CM | POA: Diagnosis not present

## 2022-08-09 DIAGNOSIS — M25521 Pain in right elbow: Secondary | ICD-10-CM

## 2022-08-09 NOTE — Therapy (Signed)
OUTPATIENT PHYSICAL THERAPY TREATMENT NOTE/ RE-EVALUATION   Patient Name: Reginald Campbell MRN: [id number] DOB:13-Feb-1976, 47 y.o., male Today's Date: 08/09/2022  PCP: Tennis Must [place], Blondell Reveal, MD  REFERRING PROVIDER: de [place], Raymond J, MD   END OF SESSION:   PT End of Session - 08/09/22 1206     Visit Number 8    Number of Visits 16    Date for PT Re-Evaluation 10/11/22    Authorization Type [employer]    Authorization Time Period Pending additional auth    PT Start Time 1130    PT Stop Time 1212    PT Time Calculation (min) 42 min    Activity Tolerance Patient tolerated treatment well    Behavior During Therapy [facility] for tasks assessed/performed               Past Medical History:  Diagnosis Date   Arthritis    Asthma    Back pain    BRBPR (bright red blood per rectum) 01/04/3418   Complication of circumcision 01/23/2017   Depression    Family history of adverse reaction to anesthesia    daughter under anesthesia for 6 hours had problems with N/V    Family history of diabetes mellitus 04/07/2015   History of kidney stones    Hypertension    hx of elevated bp, no meds for htn   Lumbar radiculopathy, chronic 10/01/2016   Nephrolithiasis 04/18/2015   Phimosis    Rectal bleeding 03/07/2017   Sleep apnea    does not use cpap due to broke   Past Surgical History:  Procedure Laterality Date   CIRCUMCISION N/A 03/27/2017   Procedure: CIRCUMCISION ADULT with canal block;  Surgeon: Alexis Frock, MD;  Location: [facility];  Service: Urology;  Laterality: N/A;   CIRCUMCISION REVISION N/A 10/04/2017   Procedure: CIRCUMCISION REVISION;  Surgeon: Alexis Frock, MD;  Location: [facility];  Service: Urology;  Laterality: N/A;   COLONOSCOPY WITH PROPOFOL N/A 02/23/2021   Procedure: COLONOSCOPY WITH PROPOFOL;  Surgeon: Lavena Bullion, DO;  Location: [facility];  Service: Gastroenterology;  Laterality: N/A;   NO PAST SURGERIES     POLYPECTOMY  02/23/2021   Procedure: POLYPECTOMY;   Surgeon: Lavena Bullion, DO;  Location: [facility];  Service: Gastroenterology;;   Patient Active Problem List   Diagnosis Date Noted   Lateral epicondylitis of right elbow 07/26/2022   Adenomatous polyp of ascending colon    Polyp of sigmoid colon    Grade III internal hemorrhoids    Screening for colorectal cancer 08/31/2020   Achilles tendonitis, bilateral 05/20/2020   Atypical mole 03/12/2019   HTN (hypertension) 10/22/2018   Chronic bilateral low back pain with bilateral sciatica 04/09/2018   Internal hemorrhoids 03/08/2017   Morbid obesity ([place]) 10/02/2016   MDD (major depressive disorder) 10/01/2016   Bilateral shoulder pain 08/18/2015   Healthcare maintenance 04/07/2015   Obstructive sleep apnea 04/07/2015    REFERRING DIAG: M54.42,M54.41,G89.29 (ICD-10-CM) - Chronic bilateral low back pain with bilateral sciatica    THERAPY DIAG:  Other low back pain - Plan: PT plan of care cert/re-cert  Other abnormalities of gait and mobility - Plan: PT plan of care cert/re-cert  Muscle weakness (generalized) - Plan: PT plan of care cert/re-cert  Pain in right elbow - Plan: PT plan of care cert/re-cert  Rationale for Evaluation and Treatment Rehabilitation  PERTINENT HISTORY: HTN, Depression    PRECAUTIONS: None  SUBJECTIVE:  SUBJECTIVE STATEMENT:  Pt reports he hurt his back in the pool while attempting to do chop exercises. He reports his pain has been increased since that time. He rates his pain as 8/10. He reports that this has negatively affected his sleep. He reports Rt lateral elbow pain of insidious onset lasting 3 weeks. He received a new referral to include this issue on his current POC. He has redness and visual swelling about the lateral epicondyle. Current elbow pain is 0/10, but when it  is aggravated, it reaches 10/10. Aggravating factors include touch to the area, wrist movements.   PAIN:  Are you having pain? yes NPRS scale: 8/10 Pain location: left lower back Pain description: achy Aggravating factors: lifting, occasional assisting with mobility/transfers of his cousin (no lifting), prolonged postioning   Relieving factors: none   OBJECTIVE: (objective measures completed at initial evaluation unless otherwise dated)   DIAGNOSTIC FINDINGS:  See imaging    PATIENT SURVEYS:  ODI: 50% disability  08/09/2022: 50% disability   COGNITION: Overall cognitive status: Within functional limits for tasks assessed               SENSATION: WFL   POSTURE: rounded shoulders, forward head, and increased lumbar lordosis; larger body habitus    PALPATION: TTP to bilateral lumbar paraspinals; R>L  08/09/2022: Exquisite TTP to Rt elbow common extensor tendon with palpable swelling and warmth   LUMBAR ROM:    AROM eval 08/09/2022  Flexion 25% reduced 50%, p!!, shotting pain to back of legs to knees  Extension 75% reduced 50%, p!  Right lateral flexion     Left lateral flexion     Right rotation 50% reduced 50%  Left rotation 50% reduced 50%   (Blank rows = not tested)   LOWER EXTREMITY MMT:     MMT Right eval Left eval Right 08/09/2022 Left 08/09/2022  Hip flexion 3+/5 3+/5 5/5 5/5  Hip extension     3+/5 4/5  Hip abduction 3+/5 3+/5 4/5 3+/5p!  Hip adduction 3+/5 3+/5    Hip internal rotation        Hip external rotation        Knee flexion 5/5 5/5    Knee extension 5/5 5/5    Ankle dorsiflexion        Ankle plantarflexion        Ankle inversion        Ankle eversion         (Blank rows = not tested)  UPPER EXTREMITY MMT:  MMT Right 08/09/2022 Left 08/09/2022  Elbow flexion 5/5 5/5  Elbow extension 4+/5p! 5/5  Wrist flexion 5/5 5/5  Wrist extension 3+/5p!! 5/5  Wrist ulnar deviation 5/5 5/5  Wrist radial deviation 3+/5p!! 5/5  Wrist pronation 5/5  5/5  Wrist supination 5/5 5/5  Grip strength (lbs) 109 99  (Blank rows = not tested)    SPECIAL TESTS:  Straight leg raise test: Negative and Slump test: Negative  08/09/2022: Wrist flexion PROM with overpressure: (+) for pain on Rt   FUNCTIONAL TESTS:  30 Second Sit to Stand: 8 reps  08/09/2022: 30 second sit to stand: 5 reps   GAIT: Distance walked: 32f Assistive device utilized: None Level of assistance: Complete Independence Comments: trunk flexed, antalgic gait   TREATMENT:  [facility] Adult PT Treatment:  DATE: 08/09/2022 Therapeutic Exercise: Hooklying modified twist crunch 2x10 BIL Hooklying LTR x10 BIL Seated sciatic nerve glide x10 BIL Standing Pallof press with 7# cable x10 with 5-sec hold Manual Therapy: N/A Neuromuscular re-ed: N/A Therapeutic Activity: Re-assessment of objective measures with pt education Assessment of objective measures related to new referral Pt education on POC Re-administration of ODI Modalities: N/A Self Care: N/A   08/02/2022 Pt seen for aquatic therapy today.  Treatment took place in water 4-4.75 ft in depth at the [employer] pool. Temp of water was 88.  Pt entered/exited the pool via stairs independently with bilat rail.  * without UE support:  walking forward/ backward; high knee marching with reciprocal arm swing * side squat R/L with arm abdct/ add with yellow -> rainbow hand floats 2 laps * holding rainbow hand floats at surface:  leg swings in sagittal plane x 10 x 2; leg swings in frontal plane (crossing midline) x 10 x 2; heel raises x 20 * solid noodle pull down to thighs x 10 ( 4 count eccentric raising to surface) * SLS with thin squoodle push down with LE, no UE support x 10 each x 2 * balance on thin squoodle with squats x 10  * L stretch holding wall x 4 reps * wood chop holding small yellow ball x 3 reps (high R, low L) - stopped due to R low back pain with R  rotation.  * return to walking backward for recovery * 49mn in hot tub (unbilled, for massage with jet to R LB)   Pt requires the buoyancy and hydrostatic pressure of water for support, and to offload joints by unweighting joint load by at least 50 % in navel deep water and by at least 75-80% in chest to neck deep water.  Viscosity of the water is needed for resistance of strengthening. Water current perturbations provides challenge to standing balance requiring increased core activation.  [facility]                                                DATE: 07/31/2021 Therapeutic Exercise: Nu-step L5 515mhile taking subjective and planning session with patient Palloff press 13# - 2x10 ea Downward chop - 13# - 2x10 ea Supine marching 2x10 BIL Supine sciatic nerve glide with towel x5 BIL Supine PPT 5" hold x10 Bridges x10, x5 LTR x10 BIL Alternating Blue TB clam - 2x10 Hip adduction ball squeeze - x5  Manual Therapy - CPA and UPA, pt in sitting with upper body supported, G II - STM bil QL  Modalities: Supine exercises performed lying on MHP with towel     PATIENT EDUCATION:  Education details: eval findings, ODI, HEP, POC Person educated: Patient Education method: Explanation, Demonstration, and Handouts Education comprehension: verbalized understanding and returned demonstration   HOME EXERCISE PROGRAM: Access Code: [email or web address] URL: [email or web address] Date: 07/02/2022 Prepared by: DaOctavio Manns Exercises - Supine Posterior Pelvic Tilt  - 2 x daily - 7 x weekly - 2 sets - 10 reps - 5 sec hold - Supine Lower Trunk Rotation  - 2 x daily - 7 x weekly - 2 sets - 10 reps - Supine Bridge  - 2 x daily - 7 x weekly - 2 sets - 10 reps - Seated Lumbar Flexion Stretch  - 2 x daily - 7 x  weekly - 2 sets - 10 reps - 5 sec hold   ASSESSMENT:   CLINICAL IMPRESSION: Upon re-assessment of lumbar objective measures, the pt remains limited in most metrics. This may  be a result of a recent lumbar strain while performing a chop exercise in aquatic therapy, as the pt reports improvement prior to this recent exacerbation. Additionally, assessment of the Rt elbow/ wrist was performed due to a new referral was received to include this problem in his POC. Ruling up Rt elbow lateral epicondylitis due to pain and weakness with Rt wrist extension and radial deviation, pain with wrist flexion PROM with overpressure, and TTP and warmth about the Rt common extensor tendon. The pt will benefit from skilled PT to address his primary impairments and return to his prior level of function with less limitation.   OBJECTIVE IMPAIRMENTS: decreased activity tolerance, decreased endurance, decreased mobility, difficulty walking, decreased ROM, decreased strength, obesity, and pain.    ACTIVITY LIMITATIONS: carrying, lifting, bending, standing, squatting, stairs, transfers, and bed mobility   PARTICIPATION LIMITATIONS: meal prep, driving, shopping, community activity, and yard work   PERSONAL FACTORS: Fitness, Time since onset of injury/illness/exacerbation, and 1-2 comorbidities: HTN, Depression   are also affecting patient's functional outcome.       GOALS: Goals reviewed with patient? No   SHORT TERM GOALS: Target date: 07/23/2022   Pt will be compliant and knowledgeable with initial HEP for improved comfort and carryover Baseline: initial HEP given  Goal status: Partially MET   2.  Pt will self report back pain no greater than 6/10 for improved comfort and functional ability Baseline: 10/10 at worst Goal status: NOT MET   LONG TERM GOALS: Target date: 08/27/2022   Pt will increase 30 Second Sit to Stand rep count to no less than 10 reps for improved balance, strength, and functional mobility Baseline: 8 reps  08/09/2022: 5 reps Goal status: IN PROGRESS   2.  Pt will be decrease ODI disability score to no greater than 35% as proxy for functional improvement Baseline:  50% disability  08/09/2022: 50% disability Goal status: IN PROGRESS   3.  Pt will self report back pain no greater than 3/10 for improved comfort and functional ability Baseline: 10/10 at worst 08/09/2022: 8/10 Goal status: IN PROGRESS   4.  Pt will be able to transfer his cousin for care giving duties with no increase in back pain for improved comfort and functional ability  Baseline: unable Goal status: INITIAL  5.  *Added 08/09/2022* Pt will achieve 5/5 global Rt wrist MMT with 0-3/10 pain in order to open containers at home with less limitation. Baseline: See MMT chart Goal status: INITIAL   PLAN:   PT FREQUENCY: 2x/week   PT DURATION: 8 weeks   PLANNED INTERVENTIONS: Therapeutic exercises, Therapeutic activity, Neuromuscular re-education, Balance training, Gait training, Patient/Family education, Self Care, Joint mobilization, Aquatic Therapy, Dry Needling, Electrical stimulation, Cryotherapy, Moist heat, Vasopneumatic device, Manual therapy, and Re-evaluation.   PLAN FOR NEXT SESSION: assess HEP response, core and hip strengthening, progress as able    Check all possible CPT codes: 97164 - PT Re-evaluation, 97110- Therapeutic Exercise, [phone]- Neuro Re-education, [phone] - Gait Training, [phone] - Manual Therapy, 97530 - Therapeutic Activities, 97535 - Self Care, 97014 - Electrical stimulation (unattended), [phone] - Electrical stimulation (Manual), [id number] - Ultrasound, [id number] - Vaso, [id number] - Orthotic Fit, and [id number] - Aquatic therapy    Check all conditions that are expected to impact treatment: Musculoskeletal disorders   If  treatment provided at initial evaluation, no treatment charged due to lack of authorization.       Vanessa [place], PT, DPT 08/09/22 12:12 PM

## 2022-08-16 ENCOUNTER — Ambulatory Visit (HOSPITAL_BASED_OUTPATIENT_CLINIC_OR_DEPARTMENT_OTHER): Payer: Medicaid Other | Admitting: Physical Therapy

## 2022-08-16 ENCOUNTER — Encounter (HOSPITAL_BASED_OUTPATIENT_CLINIC_OR_DEPARTMENT_OTHER): Payer: Self-pay | Admitting: Physical Therapy

## 2022-08-16 DIAGNOSIS — M25521 Pain in right elbow: Secondary | ICD-10-CM | POA: Diagnosis not present

## 2022-08-16 DIAGNOSIS — M5459 Other low back pain: Secondary | ICD-10-CM

## 2022-08-16 DIAGNOSIS — R2689 Other abnormalities of gait and mobility: Secondary | ICD-10-CM | POA: Diagnosis not present

## 2022-08-16 DIAGNOSIS — M6281 Muscle weakness (generalized): Secondary | ICD-10-CM

## 2022-08-16 NOTE — Therapy (Signed)
OUTPATIENT PHYSICAL THERAPY TREATMENT NOTE   Patient Name: Reginald Campbell MRN: [id number] DOB:1976/01/09, 47 y.o., male Today's Date: 08/16/2022  PCP: Tennis Must [place], Blondell Reveal, MD  REFERRING PROVIDER: de [place], Raymond J, MD   END OF SESSION:   PT End of Session - 08/16/22 1655     Visit Number 9    Number of Visits 16    Date for PT Re-Evaluation 10/11/22    Authorization Type [employer]    Authorization Time Period Pending additional auth    Progress Note Due on Visit 18    PT Start Time 1615    PT Stop Time 1655    PT Time Calculation (min) 40 min    Activity Tolerance Patient limited by pain    Behavior During Therapy [facility] for tasks assessed/performed                Past Medical History:  Diagnosis Date   Arthritis    Asthma    Back pain    BRBPR (bright red blood per rectum) 1/0/9323   Complication of circumcision 01/23/2017   Depression    Family history of adverse reaction to anesthesia    daughter under anesthesia for 6 hours had problems with N/V    Family history of diabetes mellitus 04/07/2015   History of kidney stones    Hypertension    hx of elevated bp, no meds for htn   Lumbar radiculopathy, chronic 10/01/2016   Nephrolithiasis 04/18/2015   Phimosis    Rectal bleeding 03/07/2017   Sleep apnea    does not use cpap due to broke   Past Surgical History:  Procedure Laterality Date   CIRCUMCISION N/A 03/27/2017   Procedure: CIRCUMCISION ADULT with canal block;  Surgeon: Alexis Frock, MD;  Location: [facility];  Service: Urology;  Laterality: N/A;   CIRCUMCISION REVISION N/A 10/04/2017   Procedure: CIRCUMCISION REVISION;  Surgeon: Alexis Frock, MD;  Location: [facility];  Service: Urology;  Laterality: N/A;   COLONOSCOPY WITH PROPOFOL N/A 02/23/2021   Procedure: COLONOSCOPY WITH PROPOFOL;  Surgeon: Lavena Bullion, DO;  Location: [facility];  Service: Gastroenterology;  Laterality: N/A;   NO PAST SURGERIES     POLYPECTOMY  02/23/2021   Procedure:  POLYPECTOMY;  Surgeon: Lavena Bullion, DO;  Location: [facility];  Service: Gastroenterology;;   Patient Active Problem List   Diagnosis Date Noted   Lateral epicondylitis of right elbow 07/26/2022   Adenomatous polyp of ascending colon    Polyp of sigmoid colon    Grade III internal hemorrhoids    Screening for colorectal cancer 08/31/2020   Achilles tendonitis, bilateral 05/20/2020   Atypical mole 03/12/2019   HTN (hypertension) 10/22/2018   Chronic bilateral low back pain with bilateral sciatica 04/09/2018   Internal hemorrhoids 03/08/2017   Morbid obesity ([place]) 10/02/2016   MDD (major depressive disorder) 10/01/2016   Bilateral shoulder pain 08/18/2015   Healthcare maintenance 04/07/2015   Obstructive sleep apnea 04/07/2015    REFERRING DIAG: M54.42,M54.41,G89.29 (ICD-10-CM) - Chronic bilateral low back pain with bilateral sciatica    THERAPY DIAG:  Other low back pain  Other abnormalities of gait and mobility  Muscle weakness (generalized)  Pain in right elbow  Rationale for Evaluation and Treatment Rehabilitation  PERTINENT HISTORY: HTN, Depression    PRECAUTIONS: None  SUBJECTIVE:  SUBJECTIVE STATEMENT:  Pt reports he has been using an ice pack on Lt elbow, "pain is in a different spot now".  He reports he has had difficulty getting out of bed; needed help from daughter to get out of bed yesterday.    PAIN:  Are you having pain? yes NPRS scale: 6/10 - bilat lower back, 2/10 Rt lateral elbow  Pain location:see above  Pain description: achy Aggravating factors: lifting, occasional assisting with mobility/transfers of his cousin (no lifting), prolonged postioning   Relieving factors: none   OBJECTIVE: (objective measures completed at initial evaluation unless otherwise  dated)   DIAGNOSTIC FINDINGS:  See imaging    PATIENT SURVEYS:  ODI: 50% disability  08/09/2022: 50% disability   COGNITION: Overall cognitive status: Within functional limits for tasks assessed               SENSATION: WFL   POSTURE: rounded shoulders, forward head, and increased lumbar lordosis; larger body habitus    PALPATION: TTP to bilateral lumbar paraspinals; R>L  08/09/2022: Exquisite TTP to Rt elbow common extensor tendon with palpable swelling and warmth   LUMBAR ROM:    AROM eval 08/09/2022  Flexion 25% reduced 50%, p!!, shotting pain to back of legs to knees  Extension 75% reduced 50%, p!  Right lateral flexion     Left lateral flexion     Right rotation 50% reduced 50%  Left rotation 50% reduced 50%   (Blank rows = not tested)   LOWER EXTREMITY MMT:     MMT Right eval Left eval Right 08/09/2022 Left 08/09/2022  Hip flexion 3+/5 3+/5 5/5 5/5  Hip extension     3+/5 4/5  Hip abduction 3+/5 3+/5 4/5 3+/5p!  Hip adduction 3+/5 3+/5    Hip internal rotation        Hip external rotation        Knee flexion 5/5 5/5    Knee extension 5/5 5/5    Ankle dorsiflexion        Ankle plantarflexion        Ankle inversion        Ankle eversion         (Blank rows = not tested)  UPPER EXTREMITY MMT:  MMT Right 08/09/2022 Left 08/09/2022  Elbow flexion 5/5 5/5  Elbow extension 4+/5p! 5/5  Wrist flexion 5/5 5/5  Wrist extension 3+/5p!! 5/5  Wrist ulnar deviation 5/5 5/5  Wrist radial deviation 3+/5p!! 5/5  Wrist pronation 5/5 5/5  Wrist supination 5/5 5/5  Grip strength (lbs) 109 99  (Blank rows = not tested)    SPECIAL TESTS:  Straight leg raise test: Negative and Slump test: Negative  08/09/2022: Wrist flexion PROM with overpressure: (+) for pain on Rt   FUNCTIONAL TESTS:  30 Second Sit to Stand: 8 reps  08/09/2022: 30 second sit to stand: 5 reps   GAIT: Distance walked: 21f Assistive device utilized: None Level of assistance: Complete  [place] Comments: trunk flexed, antalgic gait   [facility]                                                DATE: 08/16/2022 Pt seen for aquatic therapy today.  Treatment took place in water 4-4.75 [facility] Temp of water was 92.  Pt entered/exited the pool via  stairs independently with bilat rail. * Rt wrist extensor stretch x 30 sec * without UE support:  walking forward/ backward with reciprocal arm swing  * trial of straddling noodle with cycling -> suspended with noodle under arms (neither tolerated) * side step with R/L with arm abdct/ add without resistance x  2 laps * marching backward/ forward (limited knee height) with arm swing * holding wall with one hand, and hand float with other:  gentle leg swings flex/ext x 10 and in frontal plane crossing midline x 10 with RLE, x 6 with LLE  * return to walking backward/forward * L stretch holding low wall at drain x 4 reps of 15-20 seconds * mini squats x 10  * with ankle floats donned on wrists:  bilat shoulder abdct/addct x 10, horiz abdct/addct (just under surface) x 10; flexion/ext (0-90 deg) x 10; reciprocal arm swing stopping at neutral x 10 * return to walking backward for recovery   Pt requires the buoyancy and hydrostatic pressure of water for support, and to offload joints by unweighting joint load by at least 50 % in navel deep water and by at least 75-80% in chest to neck deep water.  Viscosity of the water is needed for resistance of strengthening. Water current perturbations provides challenge to standing balance requiring increased core activation. [place] Adult PT Treatment:                                                DATE: 08/09/2022 Therapeutic Exercise: Hooklying modified twist crunch 2x10 BIL Hooklying LTR x10 BIL Seated sciatic nerve glide x10 BIL Standing Pallof press with 7# cable x10 with 5-sec hold Manual Therapy: N/A Neuromuscular  re-ed: N/A Therapeutic Activity: Re-assessment of objective measures with pt education Assessment of objective measures related to new referral Pt education on POC Re-administration of ODI Modalities: N/A Self Care: N/A   08/02/2022 Pt seen for aquatic therapy today.  Treatment took place in water 4-4.75 ft in depth at the [employer] pool. Temp of water was 88.  Pt entered/exited the pool via stairs independently with bilat rail.  * without UE support:  walking forward/ backward; high knee marching with reciprocal arm swing * side squat R/L with arm abdct/ add with yellow -> rainbow hand floats 2 laps * holding rainbow hand floats at surface:  leg swings in sagittal plane x 10 x 2; leg swings in frontal plane (crossing midline) x 10 x 2; heel raises x 20 * solid noodle pull down to thighs x 10 ( 4 count eccentric raising to surface) * SLS with thin squoodle push down with LE, no UE support x 10 each x 2 * balance on thin squoodle with squats x 10  * L stretch holding wall x 4 reps * wood chop holding small yellow ball x 3 reps (high R, low L) - stopped due to R low back pain with R rotation.  * return to walking backward for recovery * 37mn in hot tub (unbilled, for massage with jet to R LB)   Pt requires the buoyancy and hydrostatic pressure of water for support, and to offload joints by unweighting joint load by at least 50 % in navel deep water and by at least 75-80% in chest to neck deep water.  Viscosity of the water is needed for resistance of strengthening. Water current  perturbations provides challenge to standing balance requiring increased core activation.  [facility] Adult PT Treatment:                                                DATE: 07/31/2021 Therapeutic Exercise: Nu-step L5 59mwhile taking subjective and planning session with patient Palloff press 13# - 2x10 ea Downward chop - 13# - 2x10 ea Supine marching 2x10 BIL Supine sciatic nerve glide with towel x5  BIL Supine PPT 5" hold x10 Bridges x10, x5 LTR x10 BIL Alternating Blue TB clam - 2x10 Hip adduction ball squeeze - x5  Manual Therapy - CPA and UPA, pt in sitting with upper body supported, G II - STM bil QL  Modalities: Supine exercises performed lying on MHP with towel     PATIENT EDUCATION:  Education details: aquatic modifications Person educated: Patient Education method: EConsulting civil engineer DMedia planner  Education comprehension: verbalized understanding and returned demonstration   HOME EXERCISE PROGRAM: Access Code: [email or web address] URL: [email or web address] Date: 07/02/2022 Prepared by: DOctavio Manns  Exercises - Supine Posterior Pelvic Tilt  - 2 x daily - 7 x weekly - 2 sets - 10 reps - 5 sec hold - Supine Lower Trunk Rotation  - 2 x daily - 7 x weekly - 2 sets - 10 reps - Supine Bridge  - 2 x daily - 7 x weekly - 2 sets - 10 reps - Seated Lumbar Flexion Stretch  - 2 x daily - 7 x weekly - 2 sets - 10 reps - 5 sec hold   ASSESSMENT:   CLINICAL IMPRESSION:   Dialed back intensity of exercises to encourage movement with less pain.  He reported increased back pain when putting RUE into extension with resistance and when raising knees too high with marching; pain returned to baseline with change in exercise. Pt reported slight reduction of back pain by end of session. Will avoid rotation exercises in water in future sessions, until tolerated better on land.  The pt will benefit from skilled PT to address his primary impairments and return to his prior level of function with less limitation.   OBJECTIVE IMPAIRMENTS: decreased activity tolerance, decreased endurance, decreased mobility, difficulty walking, decreased ROM, decreased strength, obesity, and pain.    ACTIVITY LIMITATIONS: carrying, lifting, bending, standing, squatting, stairs, transfers, and bed mobility   PARTICIPATION LIMITATIONS: meal prep, driving, shopping, community activity, and yard work    PERSONAL FACTORS: Fitness, Time since onset of injury/illness/exacerbation, and 1-2 comorbidities: HTN, Depression   are also affecting patient's functional outcome.       GOALS: Goals reviewed with patient? No   SHORT TERM GOALS: Target date: 07/23/2022   Pt will be compliant and knowledgeable with initial HEP for improved comfort and carryover Baseline: initial HEP given  Goal status: Partially MET   2.  Pt will self report back pain no greater than 6/10 for improved comfort and functional ability Baseline: 10/10 at worst Goal status: NOT MET   LONG TERM GOALS: Target date: 08/27/2022   Pt will increase 30 Second Sit to Stand rep count to no less than 10 reps for improved balance, strength, and functional mobility Baseline: 8 reps  08/09/2022: 5 reps Goal status: IN PROGRESS   2.  Pt will be decrease ODI disability score to no greater than 35% as proxy for functional improvement Baseline: 50% disability  08/09/2022:  50% disability Goal status: IN PROGRESS   3.  Pt will self report back pain no greater than 3/10 for improved comfort and functional ability Baseline: 10/10 at worst 08/09/2022: 8/10 Goal status: IN PROGRESS   4.  Pt will be able to transfer his cousin for care giving duties with no increase in back pain for improved comfort and functional ability  Baseline: unable Goal status: INITIAL  5.  *Added 08/09/2022* Pt will achieve 5/5 global Rt wrist MMT with 0-3/10 pain in order to open containers at home with less limitation. Baseline: See MMT chart Goal status: INITIAL   PLAN:   PT FREQUENCY: 2x/week   PT DURATION: 8 weeks   PLANNED INTERVENTIONS: Therapeutic exercises, Therapeutic activity, Neuromuscular re-education, Balance training, Gait training, Patient/Family education, Self Care, Joint mobilization, Aquatic Therapy, Dry Needling, Electrical stimulation, Cryotherapy, Moist heat, Vasopneumatic device, Manual therapy, and Re-evaluation.   PLAN FOR NEXT  SESSION: assess HEP response, core and hip strengthening, progress as able    Kerin Perna, PTA 08/16/22 5:03 PM [facility] [place], [place], [address] Phone: [phone]   Fax:  [phone]    Check all possible CPT codes: 97164 - PT Re-evaluation, 97110- Therapeutic Exercise, [id number]- Neuro Re-education, [phone] - Gait Training, [id number] - Manual Therapy, 97530 - Therapeutic Activities, 97535 - [place], 97014 - Electrical stimulation (unattended), [id number] - Electrical stimulation (Manual), [id number] - Ultrasound, [id number] - Vaso, [id number] - Orthotic Fit, and [id number] - Aquatic therapy    Check all conditions that are expected to impact treatment: Musculoskeletal disorders   If treatment provided at initial evaluation, no treatment charged due to lack of authorization.

## 2022-08-23 ENCOUNTER — Encounter: Payer: Self-pay | Admitting: Physical Therapy

## 2022-08-23 ENCOUNTER — Ambulatory Visit: Payer: Medicaid Other | Admitting: Physical Therapy

## 2022-08-23 DIAGNOSIS — M25521 Pain in right elbow: Secondary | ICD-10-CM

## 2022-08-23 DIAGNOSIS — M5459 Other low back pain: Secondary | ICD-10-CM

## 2022-08-23 DIAGNOSIS — R2689 Other abnormalities of gait and mobility: Secondary | ICD-10-CM

## 2022-08-23 DIAGNOSIS — M6281 Muscle weakness (generalized): Secondary | ICD-10-CM

## 2022-08-23 NOTE — Therapy (Signed)
OUTPATIENT PHYSICAL THERAPY TREATMENT NOTE   Patient Name: Reginald Campbell MRN: [id number] DOB:1976-03-31, 47 y.o., male Today's Date: 08/23/2022  PCP: Tennis Must [place], Blondell Reveal, MD  REFERRING PROVIDER: de [place], Raymond J, MD   END OF SESSION:   PT End of Session - 08/23/22 1129     Visit Number 10    Number of Visits 12    Date for PT Re-Evaluation 10/11/22    Authorization Type [employer]    Authorization Time Period pproved 4 visits 08/09/22-09/07/22    Progress Note Due on Visit 18    PT Start Time 1130    PT Stop Time 1212    PT Time Calculation (min) 42 min    Activity Tolerance Patient limited by pain    Behavior During Therapy [employer] for tasks assessed/performed                Past Medical History:  Diagnosis Date   Arthritis    Asthma    Back pain    BRBPR (bright red blood per rectum) 10/01/1935   Complication of circumcision 01/23/2017   Depression    Family history of adverse reaction to anesthesia    daughter under anesthesia for 6 hours had problems with N/V    Family history of diabetes mellitus 04/07/2015   History of kidney stones    Hypertension    hx of elevated bp, no meds for htn   Lumbar radiculopathy, chronic 10/01/2016   Nephrolithiasis 04/18/2015   Phimosis    Rectal bleeding 03/07/2017   Sleep apnea    does not use cpap due to broke   Past Surgical History:  Procedure Laterality Date   CIRCUMCISION N/A 03/27/2017   Procedure: CIRCUMCISION ADULT with canal block;  Surgeon: Alexis Frock, MD;  Location: [facility];  Service: Urology;  Laterality: N/A;   CIRCUMCISION REVISION N/A 10/04/2017   Procedure: CIRCUMCISION REVISION;  Surgeon: Alexis Frock, MD;  Location: [facility];  Service: Urology;  Laterality: N/A;   COLONOSCOPY WITH PROPOFOL N/A 02/23/2021   Procedure: COLONOSCOPY WITH PROPOFOL;  Surgeon: Lavena Bullion, DO;  Location: [facility];  Service: Gastroenterology;  Laterality: N/A;   NO PAST SURGERIES     POLYPECTOMY  02/23/2021    Procedure: POLYPECTOMY;  Surgeon: Lavena Bullion, DO;  Location: [facility];  Service: Gastroenterology;;   Patient Active Problem List   Diagnosis Date Noted   Lateral epicondylitis of right elbow 07/26/2022   Adenomatous polyp of ascending colon    Polyp of sigmoid colon    Grade III internal hemorrhoids    Screening for colorectal cancer 08/31/2020   Achilles tendonitis, bilateral 05/20/2020   Atypical mole 03/12/2019   HTN (hypertension) 10/22/2018   Chronic bilateral low back pain with bilateral sciatica 04/09/2018   Internal hemorrhoids 03/08/2017   Morbid obesity ([place]) 10/02/2016   MDD (major depressive disorder) 10/01/2016   Bilateral shoulder pain 08/18/2015   Healthcare maintenance 04/07/2015   Obstructive sleep apnea 04/07/2015    REFERRING DIAG: M54.42,M54.41,G89.29 (ICD-10-CM) - Chronic bilateral low back pain with bilateral sciatica    THERAPY DIAG:  Other low back pain  Other abnormalities of gait and mobility  Muscle weakness (generalized)  Pain in right elbow  Rationale for Evaluation and Treatment Rehabilitation  PERTINENT HISTORY: HTN, Depression    PRECAUTIONS: None  SUBJECTIVE:  SUBJECTIVE STATEMENT:  Pt reports that his elbow is quite a bit better.  He has been doing a wrist extensor stretch.  His back pain is manageable today.  He has been enjoying aquatic therapy because it is less stressful on his body.  PAIN:  Are you having pain? yes NPRS scale: 6/10 - bilat lower back, 0-1/10 Rt lateral elbow  Pain location:see above  Pain description: achy Aggravating factors: lifting, occasional assisting with mobility/transfers of his cousin (no lifting), prolonged postioning   Relieving factors: none   OBJECTIVE: (objective measures completed at initial evaluation  unless otherwise dated)   DIAGNOSTIC FINDINGS:  See imaging    PATIENT SURVEYS:  ODI: 50% disability  08/09/2022: 50% disability   COGNITION: Overall cognitive status: Within functional limits for tasks assessed               SENSATION: WFL   POSTURE: rounded shoulders, forward head, and increased lumbar lordosis; larger body habitus    PALPATION: TTP to bilateral lumbar paraspinals; R>L  08/09/2022: Exquisite TTP to Rt elbow common extensor tendon with palpable swelling and warmth   LUMBAR ROM:    AROM eval 08/09/2022  Flexion 25% reduced 50%, p!!, shotting pain to back of legs to knees  Extension 75% reduced 50%, p!  Right lateral flexion     Left lateral flexion     Right rotation 50% reduced 50%  Left rotation 50% reduced 50%   (Blank rows = not tested)   LOWER EXTREMITY MMT:     MMT Right eval Left eval Right 08/09/2022 Left 08/09/2022  Hip flexion 3+/5 3+/5 5/5 5/5  Hip extension     3+/5 4/5  Hip abduction 3+/5 3+/5 4/5 3+/5p!  Hip adduction 3+/5 3+/5    Hip internal rotation        Hip external rotation        Knee flexion 5/5 5/5    Knee extension 5/5 5/5    Ankle dorsiflexion        Ankle plantarflexion        Ankle inversion        Ankle eversion         (Blank rows = not tested)  UPPER EXTREMITY MMT:  MMT Right 08/09/2022 Left 08/09/2022  Elbow flexion 5/5 5/5  Elbow extension 4+/5p! 5/5  Wrist flexion 5/5 5/5  Wrist extension 3+/5p!! 5/5  Wrist ulnar deviation 5/5 5/5  Wrist radial deviation 3+/5p!! 5/5  Wrist pronation 5/5 5/5  Wrist supination 5/5 5/5  Grip strength (lbs) 109 99  (Blank rows = not tested)    SPECIAL TESTS:  Straight leg raise test: Negative and Slump test: Negative  08/09/2022: Wrist flexion PROM with overpressure: (+) for pain on Rt   FUNCTIONAL TESTS:  30 Second Sit to Stand: 8 reps  08/09/2022: 30 second sit to stand: 5 reps   GAIT: Distance walked: 20f Assistive device utilized: None Level of assistance:  Complete Independence Comments: trunk flexed, antalgic gait   TREATMENT:  [facility] Adult PT Treatment:                                                DATE: 08/23/2022 Therapeutic Exercise: nu-step L5 542mhile taking subjective and planning session with patient Wrist extensor stretch 3x30'' Wrist ext, forearm supported, 3x15 - 2# Pronation/supination - 2# - 3x15 Radial/ulnar deviation "hammer" -  2# - 3x15 Standing Pallof press with 10# cable 3x10 Alternating unilateral row in sitting - 3x15 - 10# small range lumbar ext holding cable resitance - 3x10 - 10#  [facility] Adult PT Treatment:                                                DATE: 08/16/2022 Pt seen for aquatic therapy today.  Treatment took place in water 4-4.75 ft in depth at the [facility] pool. Temp of water was 92.  Pt entered/exited the pool via stairs independently with bilat rail. * Rt wrist extensor stretch x 30 sec * without UE support:  walking forward/ backward with reciprocal arm swing  * trial of straddling noodle with cycling -> suspended with noodle under arms (neither tolerated) * side step with R/L with arm abdct/ add without resistance x  2 laps * marching backward/ forward (limited knee height) with arm swing * holding wall with one hand, and hand float with other:  gentle leg swings flex/ext x 10 and in frontal plane crossing midline x 10 with RLE, x 6 with LLE  * return to walking backward/forward * L stretch holding low wall at drain x 4 reps of 15-20 seconds * mini squats x 10  * with ankle floats donned on wrists:  bilat shoulder abdct/addct x 10, horiz abdct/addct (just under surface) x 10; flexion/ext (0-90 deg) x 10; reciprocal arm swing stopping at neutral x 10 * return to walking backward for recovery   Pt requires the buoyancy and hydrostatic pressure of water for support, and to offload joints by unweighting joint load by at least 50 % in navel deep water and by at least 75-80% in chest to neck  deep water.  Viscosity of the water is needed for resistance of strengthening. Water current perturbations provides challenge to standing balance requiring increased core activation. [facility] Adult PT Treatment:                                                DATE: 08/09/2022 Therapeutic Exercise: Hooklying modified twist crunch 2x10 BIL Hooklying LTR x10 BIL Seated sciatic nerve glide x10 BIL Standing Pallof press with 7# cable x10 with 5-sec hold Manual Therapy: N/A Neuromuscular re-ed: N/A Therapeutic Activity: Re-assessment of objective measures with pt education Assessment of objective measures related to new referral Pt education on POC Re-administration of ODI Modalities: N/A Self Care: N/A   08/02/2022 Pt seen for aquatic therapy today.  Treatment took place in water 4-4.75 ft in depth at the [facility] pool. Temp of water was 88.  Pt entered/exited the pool via stairs independently with bilat rail.  * without UE support:  walking forward/ backward; high knee marching with reciprocal arm swing * side squat R/L with arm abdct/ add with yellow -> rainbow hand floats 2 laps * holding rainbow hand floats at surface:  leg swings in sagittal plane x 10 x 2; leg swings in frontal plane (crossing midline) x 10 x 2; heel raises x 20 * solid noodle pull down to thighs x 10 ( 4 count eccentric raising to surface) * SLS with thin squoodle push down with LE, no UE support x 10 each x 2 * balance  on thin squoodle with squats x 10  * L stretch holding wall x 4 reps * wood chop holding small yellow ball x 3 reps (high R, low L) - stopped due to R low back pain with R rotation.  * return to walking backward for recovery * 26mn in hot tub (unbilled, for massage with jet to R LB)   Pt requires the buoyancy and hydrostatic pressure of water for support, and to offload joints by unweighting joint load by at least 50 % in navel deep water and by at least 75-80% in chest to neck deep water.   Viscosity of the water is needed for resistance of strengthening. Water current perturbations provides challenge to standing balance requiring increased core activation.  [facility]                                                DATE: 07/31/2021 Therapeutic Exercise: Nu-step L5 59mhile taking subjective and planning session with patient Palloff press 13# - 2x10 ea Downward chop - 13# - 2x10 ea Supine marching 2x10 BIL Supine sciatic nerve glide with towel x5 BIL Supine PPT 5" hold x10 Bridges x10, x5 LTR x10 BIL Alternating Blue TB clam - 2x10 Hip adduction ball squeeze - x5  Manual Therapy - CPA and UPA, pt in sitting with upper body supported, G II - STM bil QL  Modalities: Supine exercises performed lying on MHP with towel     PATIENT EDUCATION:  Education details: aquatic modifications Person educated: Patient Education method: ExConsulting civil engineerDeMedia planner Education comprehension: verbalized understanding and returned demonstration   HOME EXERCISE PROGRAM: Access Code: [email or web address] URL: [email or web address] Date: 07/02/2022 Prepared by: DaOctavio Manns Exercises - Supine Posterior Pelvic Tilt  - 2 x daily - 7 x weekly - 2 sets - 10 reps - 5 sec hold - Supine Lower Trunk Rotation  - 2 x daily - 7 x weekly - 2 sets - 10 reps - Supine Bridge  - 2 x daily - 7 x weekly - 2 sets - 10 reps - Seated Lumbar Flexion Stretch  - 2 x daily - 7 x weekly - 2 sets - 10 reps - 5 sec hold   ASSESSMENT:   CLINICAL IMPRESSION: JaJeneen Rinksolerated session well with no adverse reaction.  Added some new exercises for lateral epicondylitis which were tolerated well.  Overall his elbow shows significant improvement.  He remains quite limited in activity d/t his low back pain.  We tired more seated exercises today which did allow completion of a few more exercises.  He has one more aquatic visit and the we will re-eval.   OBJECTIVE IMPAIRMENTS: decreased activity  tolerance, decreased endurance, decreased mobility, difficulty walking, decreased ROM, decreased strength, obesity, and pain.    ACTIVITY LIMITATIONS: carrying, lifting, bending, standing, squatting, stairs, transfers, and bed mobility   PARTICIPATION LIMITATIONS: meal prep, driving, shopping, community activity, and yard work   PERSONAL FACTORS: Fitness, Time since onset of injury/illness/exacerbation, and 1-2 comorbidities: HTN, Depression   are also affecting patient's functional outcome.       GOALS: Goals reviewed with patient? No   SHORT TERM GOALS: Target date: 07/23/2022   Pt will be compliant and knowledgeable with initial HEP for improved comfort and carryover Baseline: initial HEP given  Goal status: Partially MET   2.  Pt will  self report back pain no greater than 6/10 for improved comfort and functional ability Baseline: 10/10 at worst Goal status: NOT MET   LONG TERM GOALS: Target date: 08/27/2022   Pt will increase 30 Second Sit to Stand rep count to no less than 10 reps for improved balance, strength, and functional mobility Baseline: 8 reps  08/09/2022: 5 reps Goal status: IN PROGRESS   2.  Pt will be decrease ODI disability score to no greater than 35% as proxy for functional improvement Baseline: 50% disability  08/09/2022: 50% disability Goal status: IN PROGRESS   3.  Pt will self report back pain no greater than 3/10 for improved comfort and functional ability Baseline: 10/10 at worst 08/09/2022: 8/10 Goal status: IN PROGRESS   4.  Pt will be able to transfer his cousin for care giving duties with no increase in back pain for improved comfort and functional ability  Baseline: unable Goal status: INITIAL  5.  *Added 08/09/2022* Pt will achieve 5/5 global Rt wrist MMT with 0-3/10 pain in order to open containers at home with less limitation. Baseline: See MMT chart Goal status: INITIAL   PLAN:   PT FREQUENCY: 2x/week   PT DURATION: 8 weeks   PLANNED  INTERVENTIONS: Therapeutic exercises, Therapeutic activity, Neuromuscular re-education, Balance training, Gait training, Patient/Family education, Self Care, Joint mobilization, Aquatic Therapy, Dry Needling, Electrical stimulation, Cryotherapy, Moist heat, Vasopneumatic device, Manual therapy, and Re-evaluation.   PLAN FOR NEXT SESSION: assess HEP response, core and hip strengthening, progress as able    Mathis Dad PT 08/23/22 12:13 PM [facility] [address] [place], [place], [address] Phone: [phone]   Fax:  [phone]

## 2022-08-30 ENCOUNTER — Ambulatory Visit (HOSPITAL_BASED_OUTPATIENT_CLINIC_OR_DEPARTMENT_OTHER): Payer: Medicaid Other | Admitting: Physical Therapy

## 2022-09-06 ENCOUNTER — Ambulatory Visit: Payer: Medicaid Other | Attending: Family Medicine | Admitting: Physical Therapy

## 2022-09-06 ENCOUNTER — Encounter: Payer: Self-pay | Admitting: Physical Therapy

## 2022-09-06 ENCOUNTER — Telehealth (HOSPITAL_BASED_OUTPATIENT_CLINIC_OR_DEPARTMENT_OTHER): Payer: Self-pay | Admitting: Family Medicine

## 2022-09-06 DIAGNOSIS — M5459 Other low back pain: Secondary | ICD-10-CM | POA: Diagnosis not present

## 2022-09-06 DIAGNOSIS — M25521 Pain in right elbow: Secondary | ICD-10-CM | POA: Diagnosis not present

## 2022-09-06 DIAGNOSIS — R2689 Other abnormalities of gait and mobility: Secondary | ICD-10-CM | POA: Insufficient documentation

## 2022-09-06 DIAGNOSIS — M6281 Muscle weakness (generalized): Secondary | ICD-10-CM | POA: Diagnosis not present

## 2022-09-06 NOTE — Therapy (Signed)
PHYSICAL THERAPY DISCHARGE SUMMARY  Visits from Start of Care: 10  Current functional level related to goals / functional outcomes: See assessment/goals   Remaining deficits: See assessment/goals   Education / Equipment: HEP and D/C plans  Patient agrees to discharge. Patient goals were not met. Patient is being discharged due to did not respond to therapy.    Patient Name: Reginald Campbell MRN: [id number] DOB:[birth year], 47 y.o., male Today's Date: 09/06/2022  PCP: Tennis Must [place], Blondell Reveal, MD  REFERRING PROVIDER: de [place], Raymond J, MD   END OF SESSION:   PT End of Session - 09/06/22 1131     Visit Number 11    Number of Visits 12    Date for PT Re-Evaluation 10/11/22    Authorization Type [employer]    Authorization Time Period pproved 4 visits 08/09/22-09/07/22    Progress Note Due on Visit 18    PT Start Time 1130    PT Stop Time 1211    PT Time Calculation (min) 41 min    Activity Tolerance Patient limited by pain    Behavior During Therapy [facility] for tasks assessed/performed                Past Medical History:  Diagnosis Date   Arthritis    Asthma    Back pain    BRBPR (bright red blood per rectum) 10/04/1694   Complication of circumcision 01/23/2017   Depression    Family history of adverse reaction to anesthesia    daughter under anesthesia for 6 hours had problems with N/V    Family history of diabetes mellitus 04/07/2015   History of kidney stones    Hypertension    hx of elevated bp, no meds for htn   Lumbar radiculopathy, chronic 10/01/2016   Nephrolithiasis 04/18/2015   Phimosis    Rectal bleeding 03/07/2017   Sleep apnea    does not use cpap due to broke   Past Surgical History:  Procedure Laterality Date   CIRCUMCISION N/A 03/27/2017   Procedure: CIRCUMCISION ADULT with canal block;  Surgeon: Alexis Frock, MD;  Location: [facility];  Service: Urology;  Laterality: N/A;   CIRCUMCISION REVISION N/A 10/04/2017   Procedure: CIRCUMCISION REVISION;   Surgeon: Alexis Frock, MD;  Location: [facility];  Service: Urology;  Laterality: N/A;   COLONOSCOPY WITH PROPOFOL N/A 02/23/2021   Procedure: COLONOSCOPY WITH PROPOFOL;  Surgeon: Lavena Bullion, DO;  Location: [facility];  Service: Gastroenterology;  Laterality: N/A;   NO PAST SURGERIES     POLYPECTOMY  02/23/2021   Procedure: POLYPECTOMY;  Surgeon: Lavena Bullion, DO;  Location: [facility];  Service: Gastroenterology;;   Patient Active Problem List   Diagnosis Date Noted   Lateral epicondylitis of right elbow 07/26/2022   Adenomatous polyp of ascending colon    Polyp of sigmoid colon    Grade III internal hemorrhoids    Screening for colorectal cancer 08/31/2020   Achilles tendonitis, bilateral 05/20/2020   Atypical mole 03/12/2019   HTN (hypertension) 10/22/2018   Chronic bilateral low back pain with bilateral sciatica 04/09/2018   Internal hemorrhoids 03/08/2017   Morbid obesity ([place]) 10/02/2016   MDD (major depressive disorder) 10/01/2016   Bilateral shoulder pain 08/18/2015   Healthcare maintenance 04/07/2015   Obstructive sleep apnea 04/07/2015    REFERRING DIAG: M54.42,M54.41,G89.29 (ICD-10-CM) - Chronic bilateral low back pain with bilateral sciatica    THERAPY DIAG:  Other low back pain  Other abnormalities of gait and mobility  Muscle weakness (generalized)  Pain in right elbow  Rationale for Evaluation and Treatment Rehabilitation  PERTINENT HISTORY: HTN, Depression    PRECAUTIONS: None  SUBJECTIVE:                                                                                                                                                                                      SUBJECTIVE STATEMENT:  Pt reports that he doesn't feel like his back is appreciably better after doing therapy.  PAIN:  Are you having pain? yes NPRS scale: 6/10 - bilat lower back, 0-1/10 Rt lateral elbow  Pain location:see above  Pain description: achy Aggravating  factors: lifting, occasional assisting with mobility/transfers of his cousin (no lifting), prolonged postioning   Relieving factors: none   OBJECTIVE: (objective measures completed at initial evaluation unless otherwise dated)   DIAGNOSTIC FINDINGS:  See imaging    PATIENT SURVEYS:  ODI: 50% disability  08/09/2022: 50% disability   COGNITION: Overall cognitive status: Within functional limits for tasks assessed               SENSATION: WFL   POSTURE: rounded shoulders, forward head, and increased lumbar lordosis; larger body habitus    PALPATION: TTP to bilateral lumbar paraspinals; R>L  08/09/2022: Exquisite TTP to Rt elbow common extensor tendon with palpable swelling and warmth   LUMBAR ROM:    AROM eval 08/09/2022  Flexion 25% reduced 50%, p!!, shotting pain to back of legs to knees  Extension 75% reduced 50%, p!  Right lateral flexion     Left lateral flexion     Right rotation 50% reduced 50%  Left rotation 50% reduced 50%   (Blank rows = not tested)   LOWER EXTREMITY MMT:     MMT Right eval Left eval Right 08/09/2022 Left 08/09/2022  Hip flexion 3+/5 3+/5 5/5 5/5  Hip extension     3+/5 4/5  Hip abduction 3+/5 3+/5 4/5 3+/5p!  Hip adduction 3+/5 3+/5    Hip internal rotation        Hip external rotation        Knee flexion 5/5 5/5    Knee extension 5/5 5/5    Ankle dorsiflexion        Ankle plantarflexion        Ankle inversion        Ankle eversion         (Blank rows = not tested)  UPPER EXTREMITY MMT:  MMT Right 08/09/2022 Left 08/09/2022  Elbow flexion 5/5 5/5  Elbow extension 4+/5p! 5/5  Wrist flexion 5/5 5/5  Wrist extension 3+/5p!! 5/5  Wrist ulnar deviation 5/5 5/5  Wrist radial deviation  3+/5p!! 5/5  Wrist pronation 5/5 5/5  Wrist supination 5/5 5/5  Grip strength (lbs) 109 99  (Blank rows = not tested)    SPECIAL TESTS:  Straight leg raise test: Negative and Slump test: Negative  08/09/2022: Wrist flexion PROM with overpressure:  (+) for pain on Rt   FUNCTIONAL TESTS:  30 Second Sit to Stand: 8 reps  08/09/2022: 30 second sit to stand: 5 reps   GAIT: Distance walked: 42f Assistive device utilized: None Level of assistance: Complete Independence Comments: trunk flexed, antalgic gait   TREATMENT:  [facility] Adult PT Treatment:                                                DATE: 09/06/2022 Therapeutic Exercise: nu-step L5 518mhile taking subjective and planning session with patient Wrist extensor stretch 3x30'' Wrist ext, forearm supported, 3x15 - 2# Pronation/supination - 2# - 3x15 Radial/ulnar deviation "hammer" - 2# - 3x15 Standing Pallof press with 10# cable 3x10 Alternating unilateral row in sitting - 3x15 - 10# small range lumbar ext holding cable resitance - 3x10 - 10#  Therapeutic Activity - collecting information for goals, checking progress, and reviewing with patient    PATIENT EDUCATION:  Education details: aquatic modifications Person educated: Patient Education method: ExConsulting civil engineerDeMedia planner Education comprehension: verbalized understanding and returned demonstration   HOME EXERCISE PROGRAM: Access Code: [email or web address] URL: [email or web address] Date: 07/02/2022 Prepared by: DaOctavio Manns Exercises - Supine Posterior Pelvic Tilt  - 2 x daily - 7 x weekly - 2 sets - 10 reps - 5 sec hold - Supine Lower Trunk Rotation  - 2 x daily - 7 x weekly - 2 sets - 10 reps - Supine Bridge  - 2 x daily - 7 x weekly - 2 sets - 10 reps - Seated Lumbar Flexion Stretch  - 2 x daily - 7 x weekly - 2 sets - 10 reps - 5 sec hold   ASSESSMENT:   CLINICAL IMPRESSION: Reginald Campbell progressed poorly with therapy.  Improved impairments include: minimal improvement.  Functional improvements include: minimal improvement.  Progressions needed include: encouraged continued exercise at home as much as possible.  Barriers to progress include: chronic condition, high levels of pain, inactivity, high  BMI.  Please see GOALS section for progress on short term and long term goals established at evaluation.  I recommend D/C home with HEP; pt agrees with plan.   OBJECTIVE IMPAIRMENTS: decreased activity tolerance, decreased endurance, decreased mobility, difficulty walking, decreased ROM, decreased strength, obesity, and pain.    ACTIVITY LIMITATIONS: carrying, lifting, bending, standing, squatting, stairs, transfers, and bed mobility   PARTICIPATION LIMITATIONS: meal prep, driving, shopping, community activity, and yard work   PERSONAL FACTORS: Fitness, Time since onset of injury/illness/exacerbation, and 1-2 comorbidities: HTN, Depression   are also affecting patient's functional outcome.       GOALS: Goals reviewed with patient? No   SHORT TERM GOALS: Target date: 07/23/2022   Pt will be compliant and knowledgeable with initial HEP for improved comfort and carryover Baseline: initial HEP given  Goal status: Partially MET   2.  Pt will self report back pain no greater than 6/10 for improved comfort and functional ability Baseline: 10/10 at worst Goal status: NOT MET   LONG TERM GOALS: Target date: 08/27/2022   Pt will increase 30 Second Sit  to Stand rep count to no less than 10 reps for improved balance, strength, and functional mobility Baseline: 8 reps  08/09/2022: 5 reps 2/8: 8 reps Goal status: NOT MET   2.  Pt will be decrease ODI disability score to no greater than 35% as proxy for functional improvement Baseline: 50% disability  08/09/2022: 50% disability 2/8: Modified Oswestry Low Back Pain Disability Questionnaire: 26 / 50 = 52.0 % Goal status: NOT MET   3.  Pt will self report back pain no greater than 3/10 for improved comfort and functional ability Baseline: 10/10 at worst 08/09/2022: 8/10 2/8: 8/10 Goal status: NOT MET   4.  Pt will be able to transfer his cousin for care giving duties with no increase in back pain for improved comfort and functional ability   Baseline: unable 2/8: very difficult Goal status: NOT MET  5.  *Added 08/09/2022* Pt will achieve 5/5 global Rt wrist MMT with 0-3/10 pain in order to open containers at home with less limitation. Baseline: See MMT chart 2/8: MET Goal status: MET   PLAN:   PT FREQUENCY: 2x/week   PT DURATION: 8 weeks   PLANNED INTERVENTIONS: Therapeutic exercises, Therapeutic activity, Neuromuscular re-education, Balance training, Gait training, Patient/Family education, Self Care, Joint mobilization, Aquatic Therapy, Dry Needling, Electrical stimulation, Cryotherapy, Moist heat, Vasopneumatic device, Manual therapy, and Re-evaluation.   PLAN FOR NEXT SESSION: assess HEP response, core and hip strengthening, progress as able    Reginald Campbell PT 09/06/22 12:14 PM [facility] [address] [place], [place], [address] Phone: [phone]   Fax:  [phone]

## 2022-09-06 NOTE — Telephone Encounter (Signed)
Pt Advised NO to the FLU and Covid Vaccine

## 2022-09-20 ENCOUNTER — Encounter (HOSPITAL_BASED_OUTPATIENT_CLINIC_OR_DEPARTMENT_OTHER): Payer: Self-pay | Admitting: Family Medicine

## 2022-09-20 ENCOUNTER — Ambulatory Visit (HOSPITAL_BASED_OUTPATIENT_CLINIC_OR_DEPARTMENT_OTHER): Payer: Medicaid Other | Admitting: Family Medicine

## 2022-09-20 VITALS — BP 124/87 | HR 68 | Temp 97.8°F | Ht 71.0 in | Wt 366.3 lb

## 2022-09-20 DIAGNOSIS — M5441 Lumbago with sciatica, right side: Secondary | ICD-10-CM | POA: Diagnosis not present

## 2022-09-20 DIAGNOSIS — M5442 Lumbago with sciatica, left side: Secondary | ICD-10-CM | POA: Diagnosis not present

## 2022-09-20 DIAGNOSIS — G8929 Other chronic pain: Secondary | ICD-10-CM | POA: Diagnosis not present

## 2022-09-20 NOTE — Assessment & Plan Note (Signed)
Participated in physical therapy since July 02, 2022.  Decided to stop physical therapy in February due to no improvement in his symptoms.  Patient has not had an MRI in the past, this may benefit the patient in the future.  He will follow-up with his PCP on March 4 for next steps.  Patient is applying for disability.  Reports symptoms have been present since 2018 due to an injury.  Denies bowel or bladder changes, denies saddle paresthesia.

## 2022-09-20 NOTE — Progress Notes (Signed)
Established Patient Office Visit  Subjective   Patient ID: Reginald Campbell, male    DOB: [birth year]  Age: 47 y.o. MRN: [id number]  Chief Complaint  Patient presents with   Follow-up    Pt here for f/u from physical therapy being cancelled, pt stated physical therapy was not working for him so he stopped it     HPI  Was going to PT for low back pain since 07/01/22. Stopped going in February due to not making progress and improving his symptoms.  PT recommended possible chiropractor visits in future. Denies loss of bowel and bladder function. Denies saddle paresthesia.  Has had back pain since 2018 due to injury to back.   Having issues with left elbow pain again, has seen PT for this as well.  Applying for disability and needs this documented.   Wants to see PCP for next steps. Has appointment on 10/01/22.     Review of Systems  Constitutional:  Negative for chills and fever.  Eyes:  Negative for blurred vision and double vision.  Respiratory:  Negative for shortness of breath.   Cardiovascular:  Negative for chest pain.  Musculoskeletal:  Positive for back pain (low back) and joint pain (right elbow pain).      Objective:     BP 124/87 (BP Location: Left Arm, Patient Position: Sitting, Cuff Size: Large)   Pulse 68   Temp 97.8 F (36.6 C) (Oral)   Ht 5' 11"$  (1.803 m)   Wt (!) 366 lb 4.8 oz (166.2 kg)   SpO2 99%   BMI 51.09 kg/m  BP Readings from Last 3 Encounters:  09/20/22 124/87  07/26/22 109/65  05/31/22 (!) 177/75      Physical Exam Vitals and nursing note reviewed.  Constitutional:      General: He is not in acute distress.    Appearance: Normal appearance. He is obese.  Cardiovascular:     Rate and Rhythm: Regular rhythm.     Heart sounds: Normal heart sounds.  Pulmonary:     Effort: Pulmonary effort is normal.     Breath sounds: Normal breath sounds.  Musculoskeletal:     Lumbar back: Tenderness present. Decreased range of motion.  Skin:     General: Skin is warm and dry.  Neurological:     General: No focal deficit present.     Mental Status: He is alert. Mental status is at baseline.  Psychiatric:        Mood and Affect: Mood normal.        Behavior: Behavior normal.        Thought Content: Thought content normal.        Judgment: Judgment normal.      No results found for any visits on 09/20/22.    The 10-year ASCVD risk score (Arnett DK, et al., 2019) is: 2.3%    Assessment & Plan:   Problem List Items Addressed This Visit     Chronic bilateral low back pain with bilateral sciatica - Primary    Participated in physical therapy since July 02, 2022.  Decided to stop physical therapy in February due to no improvement in his symptoms.  Patient has not had an MRI in the past, this may benefit the patient in the future.  He will follow-up with his PCP on March 4 for next steps.  Patient is applying for disability.  Reports symptoms have been present since 2018 due to an injury.  Denies bowel or bladder changes, denies  saddle paresthesia.     Agrees with plan of care discussed.  Questions answered.   Return for has appointment on 10/01/22 with PCP.    Chalmers Guest, FNP

## 2022-09-26 ENCOUNTER — Ambulatory Visit (HOSPITAL_BASED_OUTPATIENT_CLINIC_OR_DEPARTMENT_OTHER): Payer: Medicaid Other | Admitting: Family Medicine

## 2022-10-01 ENCOUNTER — Encounter (HOSPITAL_BASED_OUTPATIENT_CLINIC_OR_DEPARTMENT_OTHER): Payer: Self-pay | Admitting: Family Medicine

## 2022-10-01 ENCOUNTER — Ambulatory Visit (HOSPITAL_BASED_OUTPATIENT_CLINIC_OR_DEPARTMENT_OTHER): Payer: Medicaid Other | Admitting: Family Medicine

## 2022-10-01 VITALS — BP 128/76 | HR 72 | Ht 71.0 in | Wt 368.5 lb

## 2022-10-01 DIAGNOSIS — G8929 Other chronic pain: Secondary | ICD-10-CM

## 2022-10-01 DIAGNOSIS — M5442 Lumbago with sciatica, left side: Secondary | ICD-10-CM

## 2022-10-01 DIAGNOSIS — M5441 Lumbago with sciatica, right side: Secondary | ICD-10-CM | POA: Diagnosis not present

## 2022-10-01 NOTE — Assessment & Plan Note (Signed)
Patient reports that he has continued low back pain with associated sciatica.  He has been working with physical therapy, working on home exercise regimen, utilizing OTC medications.  Despite this, symptoms have persisted.  He denies any significant change in symptoms, no better or worse since symptoms began.  He has not had any new symptoms or change in the symptom quality. Given continued ongoing symptoms and associated radiculopathy, we will proceed with advanced imaging.  He previously had x-rays completed which did show spondylosis, endplate spurring, facet hypertrophy. Pending results of MRI, likely consider referral to spine specialist for further evaluation and management.  We discussed potential for procedural interventions to assist with better control of symptoms which patient would be interested in

## 2022-10-01 NOTE — Progress Notes (Signed)
    Procedures performed today:    None.  Independent interpretation of notes and tests performed by another provider:   None.  Brief History, Exam, Impression, and Recommendations:    BP 128/76 (BP Location: Left Arm, Patient Position: Sitting, Cuff Size: Large)   Pulse 72   Ht '5\' 11"'$  (1.803 m)   Wt (!) 368 lb 8 oz (167.2 kg)   SpO2 99%   BMI 51.40 kg/m   Chronic bilateral low back pain with bilateral sciatica Patient reports that he has continued low back pain with associated sciatica.  He has been working with physical therapy, working on home exercise regimen, utilizing OTC medications.  Despite this, symptoms have persisted.  He denies any significant change in symptoms, no better or worse since symptoms began.  He has not had any new symptoms or change in the symptom quality. Given continued ongoing symptoms and associated radiculopathy, we will proceed with advanced imaging.  He previously had x-rays completed which did show spondylosis, endplate spurring, facet hypertrophy. Pending results of MRI, likely consider referral to spine specialist for further evaluation and management.  We discussed potential for procedural interventions to assist with better control of symptoms which patient would be interested in  Return if symptoms worsen or fail to improve.   ___________________________________________ Rosabel Sermeno de [place], MD, ABFM, [facility] [place]

## 2022-10-21 ENCOUNTER — Other Ambulatory Visit: Payer: Medicaid Other

## 2022-11-14 ENCOUNTER — Encounter (HOSPITAL_COMMUNITY): Payer: Self-pay | Admitting: *Deleted

## 2022-11-14 ENCOUNTER — Emergency Department (HOSPITAL_COMMUNITY): Payer: Medicaid Other

## 2022-11-14 ENCOUNTER — Emergency Department (HOSPITAL_COMMUNITY)
Admission: EM | Admit: 2022-11-14 | Discharge: 2022-11-14 | Disposition: A | Discharge status: Home / Self Care | Payer: Medicaid Other | Attending: Emergency Medicine | Admitting: Emergency Medicine

## 2022-11-14 ENCOUNTER — Other Ambulatory Visit: Payer: Self-pay

## 2022-11-14 DIAGNOSIS — M25512 Pain in left shoulder: Secondary | ICD-10-CM | POA: Insufficient documentation

## 2022-11-14 MED ORDER — PREDNISONE 20 MG PO TABS: 40.0000 mg | ORAL_TABLET | Freq: Every day | ORAL | 0 refills | Status: DC

## 2022-11-14 MED ORDER — KETOROLAC TROMETHAMINE 30 MG/ML IJ SOLN
30.0000 mg | Freq: Once | INTRAMUSCULAR | Status: AC
  Administered 2022-11-14: 30 mg via INTRAMUSCULAR
  Filled 2022-11-14: qty 1

## 2022-11-14 MED ORDER — DICLOFENAC SODIUM 1 % EX GEL: 2.0000 g | Freq: Four times a day (QID) | CUTANEOUS | 11 refills | Status: DC

## 2022-11-14 NOTE — ED Provider Notes (Signed)
[place] EMERGENCY DEPARTMENT AT [facility] Provider Note   CSN: [id number] Arrival date & time: 11/14/22  2123     History  Chief Complaint  Patient presents with   Shoulder Injury    VUE PAVON is a 47 y.o. male.  HPI Patient presents with concern for left upper chest and shoulder pain.  Patient was well prior to just prior to leaving for the emergency department.  He notes that he was laying on the bed, when his grandchild crawled over him, and before she could fall from the bed he grabbed her, but stretched awkwardly and felt sudden onset of pain.  No dyspnea, no true trauma or fall from the patient himself.  No medication taken for relief.  Pain is focally in the collarbone medial and lateral.    Home Medications Prior to Admission medications   Medication Sig Start Date End Date Taking? Authorizing Provider  predniSONE (DELTASONE) 20 MG tablet Take 2 tablets (40 mg total) by mouth daily with breakfast. For the next four days 11/14/22  Yes Gerhard Munch, MD  diclofenac Sodium (VOLTAREN) 1 % GEL Apply 2 g topically 4 (four) times daily. To affected joint. 11/14/22   Gerhard Munch, MD      Allergies    Penicillins    Review of Systems   Review of Systems  All other systems reviewed and are negative.   Physical Exam Updated Vital Signs BP (!) 142/83   Pulse 73   Temp 98.2 F (36.8 C) (Oral)   Resp 16   SpO2 97%  Physical Exam Vitals and nursing note reviewed.  Constitutional:      General: He is not in acute distress.    Appearance: He is well-developed. He is obese. He is not ill-appearing, toxic-appearing or diaphoretic.  HENT:     Head: Normocephalic and atraumatic.  Eyes:     Conjunctiva/sclera: Conjunctivae normal.  Cardiovascular:     Rate and Rhythm: Normal rate and regular rhythm.     Pulses: Normal pulses.  Pulmonary:     Effort: Pulmonary effort is normal. No respiratory distress.     Breath sounds: No stridor.  Chest:     Abdominal:     General: There is no distension.  Skin:    General: Skin is warm and dry.  Neurological:     Mental Status: He is alert and oriented to person, place, and time.  Psychiatric:        Mood and Affect: Mood normal.     ED Results / Procedures / Treatments   Labs (all labs ordered are listed, but only abnormal results are displayed) Labs Reviewed - No data to display  EKG None  Radiology DG Shoulder Left  Result Date: 11/14/2022 CLINICAL DATA:  Left [place] joint pain EXAM: LEFT SHOULDER - 2+ VIEW COMPARISON:  None Available. FINDINGS: No fracture or dislocation is seen. The joint spaces are preserved. Visualized soft tissues are within normal limits. Visualized left lung is clear. IMPRESSION: Negative. Electronically Signed   By: Charline Bills M.D.   On: 11/14/2022 22:17    Procedures Procedures    Medications Ordered in ED Medications  ketorolac (TORADOL) 30 MG/ML injection 30 mg (30 mg Intramuscular Given 11/14/22 2227)    ED Course/ Medical Decision Making/ A&P                             Medical Decision Making Obese adult male  presents with acute onset left upper chest and shoulder pain following awkward twisting.  Suspicion for musculoskeletal injury though sternoclavicular disruption or AC joint disruption are considerations. Low suspicion for fracture, but x-ray ordered, Toradol ordered.   Amount and/or Complexity of Data Reviewed Radiology: ordered and independent interpretation performed. Decision-making details documented in ED Course.  Risk OTC drugs. Prescription drug management.   10:40 PM Patient in no distress, awake, alert, speaking clearly.  X-ray reviewed, discussed, no fracture.  Suspicion for musculoskeletal strain given pain, absence of fall. Patient is distally neurovascularly intact, has no other complaints, started on topical NSAID, steroids, provided sling, can follow-up with orthopedics.        Final Clinical  Impression(s) / ED Diagnoses Final diagnoses:  Acute pain of left shoulder    Rx / DC Orders ED Discharge Orders          Ordered    predniSONE (DELTASONE) 20 MG tablet  Daily with breakfast        11/14/22 2240    diclofenac Sodium (VOLTAREN) 1 % GEL  4 times daily        11/14/22 2240              Gerhard Munch, MD 11/14/22 2240

## 2022-11-14 NOTE — Discharge Instructions (Signed)
As discussed, today's evaluation has been generally reassuring.  There is likelihood that your pain is due to strain of the ligaments in your left upper chest. Please take your prescribed medications, and apply the topical cream to the painful area for the next 5 days.

## 2022-11-14 NOTE — ED Triage Notes (Signed)
Pt was trying to catch his grandbaby that was falling off the bed and injured his L shoulder radiating into his neck.

## 2022-12-04 DIAGNOSIS — N200 Calculus of kidney: Secondary | ICD-10-CM | POA: Diagnosis not present

## 2022-12-04 DIAGNOSIS — N4883 Acquired buried penis: Secondary | ICD-10-CM | POA: Diagnosis not present

## 2023-01-14 DIAGNOSIS — N200 Calculus of kidney: Secondary | ICD-10-CM | POA: Diagnosis not present

## 2023-05-02 ENCOUNTER — Ambulatory Visit (HOSPITAL_BASED_OUTPATIENT_CLINIC_OR_DEPARTMENT_OTHER): Payer: Medicaid Other | Admitting: Family Medicine

## 2023-05-02 ENCOUNTER — Encounter (HOSPITAL_BASED_OUTPATIENT_CLINIC_OR_DEPARTMENT_OTHER): Payer: Self-pay | Admitting: Family Medicine

## 2023-05-02 VITALS — BP 123/81 | HR 69 | Ht 70.0 in | Wt 363.0 lb

## 2023-05-02 DIAGNOSIS — Z0000 Encounter for general adult medical examination without abnormal findings: Secondary | ICD-10-CM

## 2023-05-02 DIAGNOSIS — M5441 Lumbago with sciatica, right side: Secondary | ICD-10-CM | POA: Diagnosis not present

## 2023-05-02 DIAGNOSIS — M5442 Lumbago with sciatica, left side: Secondary | ICD-10-CM

## 2023-05-02 DIAGNOSIS — G8929 Other chronic pain: Secondary | ICD-10-CM

## 2023-05-02 NOTE — Progress Notes (Signed)
    Procedures performed today:    None.  Independent interpretation of notes and tests performed by another provider:   None.  Brief History, Exam, Impression, and Recommendations:    BP 123/81 (BP Location: Right Arm, Patient Position: Sitting, Cuff Size: Normal)   Pulse 69   Ht 5\' 10"  (1.778 m)   Wt (!) 363 lb (164.7 kg)   SpO2 97%   BMI 52.09 kg/m   Chronic bilateral low back pain with bilateral sciatica Assessment & Plan: Patient reports that he has continued low back pain with associated sciatica.  He has previously worked with physical therapy, continues working on home exercise regimen, utilizing OTC medications.  Despite this, symptoms have persisted.  He denies any significant change in symptoms. He will have some good days and bad days, can be affected by the weather.  He has not had any new symptoms or change in the symptom quality. We previously did order an MRI, however patient reports that this was not approved by insurance.  It is unclear why this would have been the case given that patient has had prior x-rays as well as trial of conservative management including physical therapy and despite this he has had persistent symptoms.  Given this, proceeding with advanced imaging would be the next medically appropriate recommended step. Given continued ongoing symptoms and associated radiculopathy, we will proceed with advanced imaging.  He previously had x-rays completed which did show spondylosis, endplate spurring, facet hypertrophy. We will also proceed with referral to spine specialist for further evaluation and management.  We discussed potential for procedural interventions to assist with better control of symptoms which patient would be interested in  Orders: -     MR LUMBAR SPINE WO CONTRAST; Future -     Ambulatory referral to Neurosurgery  Wellness examination -     CBC with Differential/Platelet; Future -     Comprehensive metabolic panel; Future -      Hemoglobin A1c; Future -     Lipid panel; Future -     TSH Rfx on Abnormal to Free T4; Future  Return in about 3 months (around 08/02/2023) for CPE with fasting labs 1 week prior.   ___________________________________________ Chelise Hanger de [place], MD, ABFM, [facility] Primary Care and Sports Medicine [facility]

## 2023-05-02 NOTE — Assessment & Plan Note (Addendum)
Patient reports that he has continued low back pain with associated sciatica.  He has previously worked with physical therapy, continues working on home exercise regimen, utilizing OTC medications.  Despite this, symptoms have persisted.  He denies any significant change in symptoms. He will have some good days and bad days, can be affected by the weather.  He has not had any new symptoms or change in the symptom quality. We previously did order an MRI, however patient reports that this was not approved by insurance.  It is unclear why this would have been the case given that patient has had prior x-rays as well as trial of conservative management including physical therapy and despite this he has had persistent symptoms.  Given this, proceeding with advanced imaging would be the next medically appropriate recommended step. Given continued ongoing symptoms and associated radiculopathy, we will proceed with advanced imaging.  He previously had x-rays completed which did show spondylosis, endplate spurring, facet hypertrophy. We will also proceed with referral to spine specialist for further evaluation and management.  We discussed potential for procedural interventions to assist with better control of symptoms which patient would be interested in

## 2023-05-21 DIAGNOSIS — M5440 Lumbago with sciatica, unspecified side: Secondary | ICD-10-CM | POA: Diagnosis not present

## 2023-05-21 DIAGNOSIS — Z6841 Body Mass Index (BMI) 40.0 and over, adult: Secondary | ICD-10-CM | POA: Diagnosis not present

## 2023-06-12 ENCOUNTER — Other Ambulatory Visit (HOSPITAL_COMMUNITY): Payer: Self-pay | Admitting: Neurosurgery

## 2023-06-12 DIAGNOSIS — M5440 Lumbago with sciatica, unspecified side: Secondary | ICD-10-CM

## 2023-06-21 ENCOUNTER — Ambulatory Visit (HOSPITAL_COMMUNITY): Payer: Medicaid Other

## 2023-06-24 ENCOUNTER — Ambulatory Visit (HOSPITAL_COMMUNITY)
Admission: RE | Admit: 2023-06-24 | Discharge: 2023-06-24 | Disposition: A | Discharge status: Home / Self Care | Payer: Medicaid Other | Source: Ambulatory Visit | Attending: Neurosurgery | Admitting: Neurosurgery

## 2023-06-24 DIAGNOSIS — M5440 Lumbago with sciatica, unspecified side: Secondary | ICD-10-CM | POA: Insufficient documentation

## 2023-06-24 DIAGNOSIS — M47816 Spondylosis without myelopathy or radiculopathy, lumbar region: Secondary | ICD-10-CM | POA: Diagnosis not present

## 2023-06-24 DIAGNOSIS — M47817 Spondylosis without myelopathy or radiculopathy, lumbosacral region: Secondary | ICD-10-CM | POA: Diagnosis not present

## 2023-06-24 DIAGNOSIS — M545 Low back pain, unspecified: Secondary | ICD-10-CM | POA: Diagnosis not present

## 2023-06-24 DIAGNOSIS — M48061 Spinal stenosis, lumbar region without neurogenic claudication: Secondary | ICD-10-CM | POA: Diagnosis not present

## 2023-07-18 DIAGNOSIS — M5440 Lumbago with sciatica, unspecified side: Secondary | ICD-10-CM | POA: Diagnosis not present

## 2023-07-18 DIAGNOSIS — D1779 Benign lipomatous neoplasm of other sites: Secondary | ICD-10-CM | POA: Diagnosis not present

## 2023-07-18 DIAGNOSIS — Z6841 Body Mass Index (BMI) 40.0 and over, adult: Secondary | ICD-10-CM | POA: Diagnosis not present

## 2023-07-29 ENCOUNTER — Other Ambulatory Visit (HOSPITAL_BASED_OUTPATIENT_CLINIC_OR_DEPARTMENT_OTHER): Payer: Medicaid Other

## 2023-07-29 DIAGNOSIS — Z0000 Encounter for general adult medical examination without abnormal findings: Secondary | ICD-10-CM

## 2023-07-30 LAB — COMPREHENSIVE METABOLIC PANEL
ALT: 11 [IU]/L (ref 0–44)
AST: 16 [IU]/L (ref 0–40)
Albumin: 4 g/dL — ABNORMAL LOW (ref 4.1–5.1)
Alkaline Phosphatase: 106 [IU]/L (ref 44–121)
BUN/Creatinine Ratio: 16 (ref 9–20)
BUN: 14 mg/dL (ref 6–24)
Bilirubin Total: 0.4 mg/dL (ref 0.0–1.2)
CO2: 25 mmol/L (ref 20–29)
Calcium: 9.1 mg/dL (ref 8.7–10.2)
Chloride: 105 mmol/L (ref 96–106)
Creatinine, Ser: 0.86 mg/dL (ref 0.76–1.27)
Globulin, Total: 2.7 g/dL (ref 1.5–4.5)
Glucose: 99 mg/dL (ref 70–99)
Potassium: 4.1 mmol/L (ref 3.5–5.2)
Sodium: 142 mmol/L (ref 134–144)
Total Protein: 6.7 g/dL (ref 6.0–8.5)
eGFR: 106 mL/min/{1.73_m2} (ref >59)

## 2023-07-30 LAB — LIPID PANEL
Chol/HDL Ratio: 4.3 {ratio} (ref 0.0–5.0)
Cholesterol, Total: 166 mg/dL (ref 100–199)
HDL: 39 mg/dL — ABNORMAL LOW (ref >39)
LDL Chol Calc (NIH): 91 mg/dL (ref 0–99)
Triglycerides: 212 mg/dL — ABNORMAL HIGH (ref 0–149)
VLDL Cholesterol Cal: 36 mg/dL (ref 5–40)

## 2023-07-30 LAB — CBC WITH DIFFERENTIAL/PLATELET
Basophils Absolute: 0 10*3/uL (ref 0.0–0.2)
Basos: 0 %
EOS (ABSOLUTE): 0.1 10*3/uL (ref 0.0–0.4)
Eos: 2 %
Hematocrit: 41.8 % (ref 37.5–51.0)
Hemoglobin: 13.6 g/dL (ref 13.0–17.7)
Immature Grans (Abs): 0 10*3/uL (ref 0.0–0.1)
Immature Granulocytes: 0 %
Lymphocytes Absolute: 1.6 10*3/uL (ref 0.7–3.1)
Lymphs: 24 %
MCH: 29 pg (ref 26.6–33.0)
MCHC: 32.5 g/dL (ref 31.5–35.7)
MCV: 89 fL (ref 79–97)
Monocytes Absolute: 0.5 10*3/uL (ref 0.1–0.9)
Monocytes: 7 %
Neutrophils Absolute: 4.5 10*3/uL (ref 1.4–7.0)
Neutrophils: 67 %
Platelets: 154 10*3/uL (ref 150–450)
RBC: 4.69 x10E6/uL (ref 4.14–5.80)
RDW: 13.7 % (ref 11.6–15.4)
WBC: 6.7 10*3/uL (ref 3.4–10.8)

## 2023-07-30 LAB — T4F: T4,Free (Direct): 0.88 ng/dL (ref 0.82–1.77)

## 2023-07-30 LAB — HEMOGLOBIN A1C
Est. average glucose Bld gHb Est-mCnc: 114 mg/dL
Hgb A1c MFr Bld: 5.6 % (ref 4.8–5.6)

## 2023-07-30 LAB — TSH RFX ON ABNORMAL TO FREE T4: TSH: 8.26 u[IU]/mL — ABNORMAL HIGH (ref 0.450–4.500)

## 2023-08-05 ENCOUNTER — Encounter (HOSPITAL_BASED_OUTPATIENT_CLINIC_OR_DEPARTMENT_OTHER): Payer: Self-pay | Admitting: Family Medicine

## 2023-08-05 ENCOUNTER — Ambulatory Visit (INDEPENDENT_AMBULATORY_CARE_PROVIDER_SITE_OTHER): Payer: Medicaid Other | Admitting: Family Medicine

## 2023-08-05 VITALS — BP 124/88 | HR 68 | Ht 70.0 in | Wt 367.4 lb

## 2023-08-05 DIAGNOSIS — G4733 Obstructive sleep apnea (adult) (pediatric): Secondary | ICD-10-CM

## 2023-08-05 DIAGNOSIS — E038 Other specified hypothyroidism: Secondary | ICD-10-CM | POA: Diagnosis not present

## 2023-08-05 DIAGNOSIS — D1779 Benign lipomatous neoplasm of other sites: Secondary | ICD-10-CM | POA: Insufficient documentation

## 2023-08-05 DIAGNOSIS — Z0000 Encounter for general adult medical examination without abnormal findings: Secondary | ICD-10-CM | POA: Diagnosis not present

## 2023-08-05 NOTE — Assessment & Plan Note (Signed)
 Noted on recent labs.  He does indicate that he has family history of thyroid  issues.  Does not currently have any symptoms suggestive of overt hypothyroidism.  We did discuss options today including monitoring versus initiating treatment.  For now, patient would prefer to continue with monitoring.  Will plan to repeat thyroid  labs in about 6 months.  Advised that if he does begin to notice any symptoms, recommend returning to the office sooner to review and consider rechecking thyroid  labs sooner.

## 2023-08-05 NOTE — Patient Instructions (Signed)
  Medication Instructions:  Your physician recommends that you continue on your current medications as directed. Please refer to the Current Medication list given to you today. --If you need a refill on any your medications before your next appointment, please call your pharmacy first. If no refills are authorized on file call the office.-- Lab Work: Your physician has recommended that you have lab work today: no If you have labs (blood work) drawn today and your tests are completely normal, you will receive your results via MyChart message OR a phone call from our staff.  Please ensure you check your voicemail in the event that you authorized detailed messages to be left on a delegated number. If you have any lab test that is abnormal or we need to change your treatment, we will call you to review the results.  Referrals/Procedures/Imaging: no  Follow-Up: Your next appointment:   Your physician recommends that you schedule a follow-up appointment in: 6 month follow up with Dr. de [place] with labs prior  You will receive a text message or e-mail with a link to a survey about your care and experience with us  today! We would greatly appreciate your feedback!   Thanks for letting us  be apart of your health journey!!  Primary Care and Sports Medicine   Dr. Quintin sheerer [place]   We encourage you to activate your patient portal called MyChart.  Sign up information is provided on this After Visit Summary.  MyChart is used to connect with patients for Virtual Visits (Telemedicine).  Patients are able to view lab/test results, encounter notes, upcoming appointments, etc.  Non-urgent messages can be sent to your provider as well. To learn more about what you can do with MyChart, please visit --  [email or web address].

## 2023-08-05 NOTE — Progress Notes (Signed)
 Subjective:    CC: Annual Physical Exam  HPI:  Reginald Campbell is a 48 y.o. presenting for annual physical  I reviewed the past medical history, family history, social history, surgical history, and allergies today and no changes were needed.  Please see the problem list section below in epic for further details.  Past Medical History: Past Medical History:  Diagnosis Date   Arthritis    Asthma    Back pain    BRBPR (bright red blood per rectum) 04/07/2015   Complication of circumcision 01/23/2017   Depression    Family history of adverse reaction to anesthesia    daughter under anesthesia for 6 hours had problems with N/V    Family history of diabetes mellitus 04/07/2015   History of kidney stones    Hypertension    hx of elevated bp, no meds for htn   Lumbar radiculopathy, chronic 10/01/2016   Nephrolithiasis 04/18/2015   Phimosis    Rectal bleeding 03/07/2017   Sleep apnea    does not use cpap due to broke   Past Surgical History: Past Surgical History:  Procedure Laterality Date   CIRCUMCISION N/A 03/27/2017   Procedure: CIRCUMCISION ADULT with canal block;  Surgeon: Alvaro Hummer, MD;  Location: [facility];  Service: Urology;  Laterality: N/A;   CIRCUMCISION REVISION N/A 10/04/2017   Procedure: CIRCUMCISION REVISION;  Surgeon: Alvaro Hummer, MD;  Location: [facility];  Service: Urology;  Laterality: N/A;   COLONOSCOPY WITH PROPOFOL  N/A 02/23/2021   Procedure: COLONOSCOPY WITH PROPOFOL ;  Surgeon: San Sandor GAILS, DO;  Location: [facility];  Service: Gastroenterology;  Laterality: N/A;   NO PAST SURGERIES     POLYPECTOMY  02/23/2021   Procedure: POLYPECTOMY;  Surgeon: San Sandor GAILS, DO;  Location: [facility];  Service: Gastroenterology;;   Social History: Social History   Socioeconomic History   Marital status: Widowed    Spouse name: Not on file   Number of children: 1   Years of education: Not on file   Highest education level: Not on file  Occupational History    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  Tobacco Use   Smoking status: Former    Current packs/day: 0.00    Average packs/day: 1 pack/day for 1 year (1.0 ttl pk-yrs)    Types: Cigarettes    Start date: 07/30/1993    Quit date: 07/30/1994    Years since quitting: 29.0   Smokeless tobacco: Never  Vaping Use   Vaping status: Never Used  Substance and Sexual Activity   Alcohol use: No    Alcohol/week: 0.0 standard drinks of alcohol   Drug use: No   Sexual activity: Not on file  Other Topics Concern   Not on file  Social History Narrative   Not on file   Social Drivers of Health   Financial Resource Strain: Low Risk  (05/02/2023)   Overall Financial Resource Strain (CARDIA)    Difficulty of Paying Living Expenses: Not hard at all  Food Insecurity: No Food Insecurity (05/02/2023)   Hunger Vital Sign    Worried About Running Out of Food in the Last Year: Never true    Ran Out of Food in the Last Year: Never true  Transportation Needs: No Transportation Needs (05/02/2023)   PRAPARE - Administrator, Civil Service (Medical): No    Lack of Transportation (Non-Medical): No  Physical Activity: Inactive (05/02/2023)   Exercise Vital Sign    Days of Exercise per Week: 0 days  Minutes of Exercise per Session: 0 min  Stress: No Stress Concern Present (05/02/2023)   [employer] of Occupational Health - Occupational Stress Questionnaire    Feeling of Stress : Not at all  Social Connections: Socially Isolated (05/02/2023)   Social Connection and Isolation Panel [NHANES]    Frequency of Communication with Friends and Family: More than three times a week    Frequency of Social Gatherings with Friends and Family: Not on file    Attends Religious Services: Never    Database Administrator or Organizations: No    Attends Banker Meetings: Never    Marital Status: Widowed   Family History: Family History  Problem Relation Age of Onset   Heart disease Mother    Diabetes Father     Hypertension Father    Cirrhosis Father    Liver cancer Father    Heart disease Maternal Grandfather    Melanoma Paternal Grandmother    Lung cancer Paternal Uncle    Lung cancer Paternal Aunt    Asthma Daughter    Gallbladder disease Daughter    Erythema nodosum Daughter    Pancreatic cancer Neg Hx    Esophageal cancer Neg Hx    Allergies: Allergies  Allergen Reactions   Penicillins Anaphylaxis, Hives, Swelling and Other (See Comments)    Pt was 15 Has patient had a PCN reaction causing immediate rash, facial/tongue/throat swelling, SOB or lightheadedness with hypotension: Yes Has patient had a PCN reaction causing severe rash involving mucus membranes or skin necrosis: No Has patient had a PCN reaction that required hospitalization: Yes Has patient had a PCN reaction occurring within the last 10 years: No If all of the above answers are NO, then may proceed with Cephalosporin use.    Medications: See med rec.  Review of Systems: No headache, visual changes, nausea, vomiting, diarrhea, constipation, dizziness, abdominal pain, skin rash, fevers, chills, night sweats, swollen lymph nodes, weight loss, chest pain, body aches, joint swelling, muscle aches, shortness of breath, mood changes, visual or auditory hallucinations.  Objective:    BP 124/88 (BP Location: Right Arm, Patient Position: Sitting, Cuff Size: Large)   Pulse 68   Ht 5' 10 (1.778 m)   Wt (!) 367 lb 6.4 oz (166.7 kg)   SpO2 97%   BMI 52.72 kg/m   General: Well Developed, well nourished, and in no acute distress. Neuro: Alert and oriented x3, extra-ocular muscles intact, sensation grossly intact. Cranial nerves II through XII are intact, motor, sensory, and coordinative functions are all intact. HEENT: Normocephalic, atraumatic, pupils equal round reactive to light, neck supple, no masses, no lymphadenopathy, thyroid  nonpalpable. Oropharynx, nasopharynx, external ear canals are unremarkable. Skin: Warm and  dry, no rashes noted. Cardiac: Regular rate and rhythm, no murmurs rubs or gallops. Respiratory: Clear to auscultation bilaterally. Not using accessory muscles, speaking in full sentences. Abdominal: Soft, nontender, nondistended, positive bowel sounds, no masses, no organomegaly. Musculoskeletal: Shoulder, elbow, wrist, hip, knee, ankle stable, and with full range of motion.  Impression and Recommendations:    Wellness examination Assessment & Plan: Routine HCM labs reviewed. HCM reviewed/discussed. Anticipatory guidance regarding healthy weight, lifestyle and choices given. Recommend healthy diet.  Recommend approximately 150 minutes/week of moderate intensity exercise Recommend regular dental and vision exams Always use seatbelt/lap and shoulder restraints Recommend using smoke alarms and checking batteries at least twice a year Recommend using sunscreen when outside Discussed colon cancer screening recommendations, options.  Patient is UTD Discussed tetanus immunization recommendations, patient  is UTD   Obstructive sleep apnea Assessment & Plan: Patient with underlying sleep apnea, has not been adequately treated recently due to not using CPAP.  It seems there have been various issues related to fit of mask as well as appropriate electrical source where he lives.  Reviewed with patient risks related to untreated sleep apnea and importance of needing to appropriately treat underlying condition We will place referral for patient to establish with sleep medicine specialist  Orders: -     Ambulatory referral to Neurology  Subclinical hypothyroidism Assessment & Plan: Noted on recent labs.  He does indicate that he has family history of thyroid  issues.  Does not currently have any symptoms suggestive of overt hypothyroidism.  We did discuss options today including monitoring versus initiating treatment.  For now, patient would prefer to continue with monitoring.  Will plan to repeat  thyroid  labs in about 6 months.  Advised that if he does begin to notice any symptoms, recommend returning to the office sooner to review and consider rechecking thyroid  labs sooner.  Orders: -     TSH + free T4; Future -     T3; Future  Return in about 6 months (around 02/02/2024) for thyroid  labs.   ___________________________________________ Jerone Cudmore de [place], MD, ABFM, CAQSM Primary Care and Sports Medicine [facility]

## 2023-08-05 NOTE — Assessment & Plan Note (Signed)
Patient with underlying sleep apnea, has not been adequately treated recently due to not using CPAP.  It seems there have been various issues related to fit of mask as well as appropriate electrical source where he lives.  Reviewed with patient risks related to untreated sleep apnea and importance of needing to appropriately treat underlying condition We will place referral for patient to establish with sleep medicine specialist

## 2023-08-05 NOTE — Assessment & Plan Note (Addendum)
Routine HCM labs reviewed. HCM reviewed/discussed. Anticipatory guidance regarding healthy weight, lifestyle and choices given. Recommend healthy diet.  Recommend approximately 150 minutes/week of moderate intensity exercise Recommend regular dental and vision exams Always use seatbelt/lap and shoulder restraints Recommend using smoke alarms and checking batteries at least twice a year Recommend using sunscreen when outside Discussed colon cancer screening recommendations, options.  Patient is UTD Discussed tetanus immunization recommendations, patient is UTD

## 2023-09-03 ENCOUNTER — Encounter: Payer: Self-pay | Admitting: Neurology

## 2023-09-03 ENCOUNTER — Ambulatory Visit: Payer: Medicaid Other | Admitting: Neurology

## 2023-09-03 VITALS — BP 114/81 | HR 66 | Ht 70.0 in | Wt 367.4 lb

## 2023-09-03 DIAGNOSIS — Z9189 Other specified personal risk factors, not elsewhere classified: Secondary | ICD-10-CM | POA: Insufficient documentation

## 2023-09-03 DIAGNOSIS — Z6841 Body Mass Index (BMI) 40.0 and over, adult: Secondary | ICD-10-CM

## 2023-09-03 DIAGNOSIS — F519 Sleep disorder not due to a substance or known physiological condition, unspecified: Secondary | ICD-10-CM

## 2023-09-03 DIAGNOSIS — F5104 Psychophysiologic insomnia: Secondary | ICD-10-CM | POA: Diagnosis not present

## 2023-09-03 DIAGNOSIS — E66813 Obesity, class 3: Secondary | ICD-10-CM | POA: Diagnosis not present

## 2023-09-03 DIAGNOSIS — Z8669 Personal history of other diseases of the nervous system and sense organs: Secondary | ICD-10-CM | POA: Diagnosis not present

## 2023-09-03 MED ORDER — ALPRAZOLAM 0.5 MG PO TABS: 0.5000 mg | ORAL_TABLET | Freq: Three times a day (TID) | ORAL | 0 refills | Status: DC | PRN
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
## 2023-09-03 NOTE — Patient Instructions (Addendum)
 Sleep Apnea Test: What to Expect  Sleep apnea is a condition that affects your breathing while you're sleeping. You may have shallow breathing or stop breathing for short periods of time. Sleep apnea screening is a test to check if you're at risk for sleep apnea. The test includes questions. It will only takes a few minutes. Your health care provider may ask you to have this test before a surgery or as part of a physical exam. What are the symptoms of sleep apnea? Snoring. Waking up often at night. Daytime sleepiness. Pauses in breathing. Choking or gasping during sleep. Being annoyed easily. Forgetfulness. Trouble thinking clearly. Depression. Personality changes. Headaches in the morning. Most people with sleep apnea do not know that they have it. What are the advantages of sleep apnea screening? Getting screened for sleep apnea can help: Keep you safer. Your providers need to know whether or not you have sleep apnea, especially if you're having surgery or have other long-term, or chronic, health conditions. Improve your health and help you get a better night's rest. Restful sleep can help you: Have more energy. Lose weight. Improve high blood pressure. Improve diabetes management. Prevent stroke. Prevent car accidents. What happens before the screening? You may talk with your provider about the screening and what other tests may be recommended based on the screening. What happens during the screening? Screening usually includes being asked a list of questions about your sleep quality. Some questions you may be asked include: Do you snore? Is your sleep restless? Do you have daytime sleepiness? Has a partner or spouse told you that you stop breathing, choke, or gasp during sleep? Have you had trouble concentrating or memory loss? What is your age? What is your neck circumference? To measure your neck, keep your back straight and gently wrap the tape measure around your neck.  Put the tape measure at the middle of your neck, between your chin and collarbone. What is your sex assigned at birth? Do you have high blood pressure or are you being treated for high blood pressure? If your screening test is positive, you're at risk for the condition. More tests may be needed to confirm a diagnosis of sleep apnea. What can I expect after the screening? Your provider will go over the results of the screening with you and make recommendations based on the results of the test. Where to find more information You can find screening tools online or at your health care clinic. To learn more, go to these websites: Centers for Disease Control and Prevention: Diningcalendar.de. Then: Click Health Topics A-Z. Type sleep apnea in the search box. National Heart, Lung, and Blood Institute: buffalodrycleaner.gl Contact a health care provider if: You think that you may have sleep apnea. This information is not intended to replace advice given to you by your health care provider. Make sure you discuss any questions you have with your health care provider. Document Revised: 12/22/2022 Document Reviewed: 12/22/2022 Elsevier Patient Education  2024 Elsevier Inc.   Please bring the prescribed Xanax  pills to the sleep lab when you undergo your study, it will help to gain the most sleep time for the limited time in the lab.   This will be a SPLIT study .   Your risk factor is OBESITY-   Obesity, Adult Obesity is the condition of having too much total body fat. Being overweight or obese means that your weight is greater than what is considered healthy for your body size. Obesity is determined by  a measurement called BMI (body mass index). BMI is an estimate of body fat and is calculated from height and weight. For adults, a BMI of 30 or higher is considered obese. Obesity can lead to other health concerns and major illnesses, including: Stroke. Coronary artery disease (CAD). Type 2 diabetes. Some types  of cancer, including cancers of the colon, breast, uterus, and gallbladder. High blood pressure (hypertension). High cholesterol. Gallbladder stones. Obesity can also contribute to: Osteoarthritis. Sleep apnea. Infertility problems. What are the causes? Common causes of this condition include: Eating daily meals that are high in calories, sugar, and fat. Drinking high amounts of sugar-sweetened beverages, such as soft drinks. Being born with genes that may make you more likely to become obese. Having a medical condition that causes obesity, including: Hypothyroidism. Polycystic ovarian syndrome (PCOS). Binge-eating disorder. Cushing syndrome. Taking certain medicines, such as steroids, antidepressants, and seizure medicines. Not being physically active (sedentary lifestyle). Not getting enough sleep. What increases the risk? The following factors may make you more likely to develop this condition: Having a family history of obesity. Living in an area with limited access to: [place], recreation centers, or sidewalks. Healthy food choices, such as grocery stores and farmers' markets. What are the signs or symptoms? The main sign of this condition is having too much body fat. How is this diagnosed? This condition is diagnosed based on: Your BMI. If you are an adult with a BMI of 30 or higher, you are considered obese. Your waist circumference. This measures the distance around your waistline. Your skinfold thickness. Your health care provider may gently pinch a fold of your skin and measure it. You may have other tests to check for underlying conditions. How is this treated? Treatment for this condition often includes changing your lifestyle. Treatment may include some or all of the following: Dietary changes. This may include developing a healthy meal plan. Regular physical activity. This may include activity that causes your heart to beat faster (aerobic exercise) and strength  training. Work with your health care provider to design an exercise program that works for you. Medicine to help you lose weight if you are unable to lose one pound a week after six weeks of healthy eating and more physical activity. Treating conditions that cause the obesity (underlying conditions). Surgery. Surgical options may include gastric banding and gastric bypass. Surgery may be done if: Other treatments have not helped to improve your condition. You have a BMI of 40 or higher. You have life-threatening health problems related to obesity. Follow these instructions at home: Eating and drinking  Follow recommendations from your health care provider about what you eat and drink. Your health care provider may advise you to: Limit fast food, sweets, and processed snack foods. Choose low-fat options, such as low-fat milk instead of whole milk. Eat five or more servings of fruits or vegetables every day. Choose healthy foods when you eat out. Keep low-fat snacks available. Limit sugary drinks, such as soda, fruit juice, sweetened iced tea, and flavored milk. Drink enough water to keep your urine pale yellow. Do not follow a fad diet. Fad diets can be unhealthy and even dangerous. Other healthful choices include: Eat at home more often. This gives you more control over what you eat. Learn to read food labels. This will help you understand how much food is considered one serving. Learn what a healthy serving size is. Physical activity Exercise regularly, as told by your health care provider. Most adults should get up  to 150 minutes of moderate-intensity exercise every week. Ask your health care provider what types of exercise are safe for you and how often you should exercise. Warm up and stretch before being active. Cool down and stretch after being active. Rest between periods of activity. Lifestyle Work with your health care provider and a dietitian to set a weight-loss goal that is  healthy and reasonable for you. Limit your screen time. Find ways to reward yourself that do not involve food. Do not drink alcohol if: Your health care provider tells you not to drink. You are pregnant, may be pregnant, or are planning to become pregnant. If you drink alcohol: Limit how much you have to: 0-1 drink a day for women. 0-2 drinks a day for men. Know how much alcohol is in your drink. In the [place] one drink equals one 12 oz bottle of beer (355 mL), one 5 oz glass of wine (148 mL), or one 1 oz glass of hard liquor (44 mL). General instructions Keep a weight-loss journal to keep track of the food you eat and how much exercise you get. Take over-the-counter and prescription medicines only as told by your health care provider. Take vitamins and supplements only as told by your health care provider. Consider joining a support group. Your health care provider may be able to recommend a support group. Pay attention to your mental health as obesity can lead to depression or self esteem issues. Keep all follow-up visits. This is important. Contact a health care provider if: You are unable to meet your weight-loss goal after six weeks of dietary and lifestyle changes. You have trouble breathing. Summary Obesity is the condition of having too much total body fat. Being overweight or obese means that your weight is greater than what is considered healthy for your body size. Work with your health care provider and a dietitian to set a weight-loss goal that is healthy and reasonable for you. Exercise regularly, as told by your health care provider. Ask your health care provider what types of exercise are safe for you and how often you should exercise. This information is not intended to replace advice given to you by your health care provider. Make sure you discuss any questions you have with your health care provider. Document Revised: 02/21/2021 Document Reviewed: 02/21/2021 Elsevier  Patient Education  2024 Elsevier Inc.   48 y.o. male  here with:  Untreated OSA for almost 8 years - he reports intolerance to CPAP and may tolerate BiPAP.      1) also extreme hypothyroidism, this may be part of weight gain and fatigue.    2) high risk for OSA by weight and neck size, BMI is 52.7 and he reportedly gained weight since last sleep test.    3) unclear why he has interrupted sleep and difficulties to go to sleep.   He reports chronic joint and back pain, but also sleep choking.  Sleep can be delayed until 5 AM,  in a dark, cool and not fully quiet bedroom.  Drinks tea until 5 PM , no later.  No nocturia. Mouth breather.    I will order a SPLIT night study in lab for Reginald Campbell and order  xanax  0.5 mg tabs for sleep onset , he is instructed to bring this to the sleep lab.    I plan to follow up either personally or through our NP within 3-5 months.    I would like to thank de [place], Quintin PARAS, MD  and De [place], Quintin PARAS, Md [address] [place],  [place] [address] for allowing me to meet with and to take care of this pleasant patient.      After spending a total time of  45  minutes face to face and additional time for physical and neurologic examination, review of laboratory studies,  personal review of imaging studies, reports and results of other testing and review of referral information / records as far as provided in visit,    Electronically signed by: Reginald Gores, MD 09/03/2023 10:53 AM   [facility] and [facility] Board certified by The [employer] of Sleep Medicine and Diplomate of the [employer]. Board certified In Neurology through the ABPN, Fellow of the [employer] of [employer].

## 2023-09-04 ENCOUNTER — Telehealth: Payer: Self-pay | Admitting: Neurology

## 2023-09-04 DIAGNOSIS — G4733 Obstructive sleep apnea (adult) (pediatric): Secondary | ICD-10-CM

## 2023-09-04 DIAGNOSIS — Z8669 Personal history of other diseases of the nervous system and sense organs: Secondary | ICD-10-CM

## 2023-09-04 DIAGNOSIS — Z9189 Other specified personal risk factors, not elsewhere classified: Secondary | ICD-10-CM

## 2023-09-04 NOTE — Telephone Encounter (Signed)
 Split [facility] [employer] pending

## 2023-09-05 NOTE — Telephone Encounter (Signed)
 Please see below the insurance is asking if the patient can do APAP titration. Would you want to do a hst first?

## 2023-09-05 NOTE — Addendum Note (Signed)
 Addended by: Elton Ham on: 09/05/2023 03:26 PM   Modules accepted: Orders

## 2023-09-05 NOTE — Telephone Encounter (Signed)
 Home sleep test ordered since split night is denied.

## 2023-09-08 NOTE — Addendum Note (Signed)
 Addended by: Neomia Banner on: 09/08/2023 06:11 PM   Modules accepted: Orders

## 2023-09-09 NOTE — Telephone Encounter (Signed)
HST- [employer] no auth req.

## 2023-09-09 NOTE — Telephone Encounter (Signed)
Checked status still pending.  

## 2023-09-10 NOTE — Telephone Encounter (Signed)
[employer] approved the split sleep study.

## 2023-09-11 NOTE — Telephone Encounter (Signed)
Left voicemail for patient to call back to schedule

## 2023-09-11 NOTE — Telephone Encounter (Signed)
Patient returned my call.' Split [facility] [employer] Berkley Harvey: [id number] (exp. 09/04/23 to 11/02/23)  He is scheduled at [facility] for 10/08/23 at 8 pm.  Mailed packet to the patient.

## 2023-10-05 ENCOUNTER — Encounter (HOSPITAL_COMMUNITY): Payer: Self-pay | Admitting: Emergency Medicine

## 2023-10-05 ENCOUNTER — Emergency Department (HOSPITAL_COMMUNITY)

## 2023-10-05 ENCOUNTER — Emergency Department (HOSPITAL_COMMUNITY)
Admission: EM | Admit: 2023-10-05 | Discharge: 2023-10-05 | Disposition: A | Discharge status: Home / Self Care | Attending: Emergency Medicine | Admitting: Emergency Medicine

## 2023-10-05 DIAGNOSIS — K5730 Diverticulosis of large intestine without perforation or abscess without bleeding: Secondary | ICD-10-CM | POA: Diagnosis not present

## 2023-10-05 DIAGNOSIS — N23 Unspecified renal colic: Secondary | ICD-10-CM | POA: Diagnosis not present

## 2023-10-05 DIAGNOSIS — R935 Abnormal findings on diagnostic imaging of other abdominal regions, including retroperitoneum: Secondary | ICD-10-CM | POA: Diagnosis not present

## 2023-10-05 DIAGNOSIS — K8689 Other specified diseases of pancreas: Secondary | ICD-10-CM | POA: Diagnosis not present

## 2023-10-05 DIAGNOSIS — R109 Unspecified abdominal pain: Secondary | ICD-10-CM | POA: Diagnosis not present

## 2023-10-05 DIAGNOSIS — R10A0 Flank pain, unspecified side: Secondary | ICD-10-CM

## 2023-10-05 LAB — LIPASE, BLOOD: Lipase: 27 U/L (ref 11–51)

## 2023-10-05 LAB — COMPREHENSIVE METABOLIC PANEL
ALT: 12 U/L (ref 0–44)
AST: 18 U/L (ref 15–41)
Albumin: 4 g/dL (ref 3.5–5.0)
Alkaline Phosphatase: 91 U/L (ref 38–126)
Anion gap: 7 (ref 5–15)
BUN: 17 mg/dL (ref 6–20)
CO2: 26 mmol/L (ref 22–32)
Calcium: 8.7 mg/dL — ABNORMAL LOW (ref 8.9–10.3)
Chloride: 105 mmol/L (ref 98–111)
Creatinine, Ser: 1.07 mg/dL (ref 0.61–1.24)
GFR, Estimated: >60 mL/min (ref >60)
Glucose, Bld: 153 mg/dL — ABNORMAL HIGH (ref 70–99)
Potassium: 4 mmol/L (ref 3.5–5.1)
Sodium: 138 mmol/L (ref 135–145)
Total Bilirubin: 1.1 mg/dL (ref 0.0–1.2)
Total Protein: 7.8 g/dL (ref 6.5–8.1)

## 2023-10-05 LAB — CBC WITH DIFFERENTIAL/PLATELET
Abs Immature Granulocytes: 0.02 10*3/uL (ref 0.00–0.07)
Basophils Absolute: 0 10*3/uL (ref 0.0–0.1)
Basophils Relative: 0 %
Eosinophils Absolute: 0.1 10*3/uL (ref 0.0–0.5)
Eosinophils Relative: 1 %
HCT: 45.7 % (ref 39.0–52.0)
Hemoglobin: 14.2 g/dL (ref 13.0–17.0)
Immature Granulocytes: 0 %
Lymphocytes Relative: 12 %
Lymphs Abs: 0.8 10*3/uL (ref 0.7–4.0)
MCH: 28.5 pg (ref 26.0–34.0)
MCHC: 31.1 g/dL (ref 30.0–36.0)
MCV: 91.6 fL (ref 80.0–100.0)
Monocytes Absolute: 0.4 10*3/uL (ref 0.1–1.0)
Monocytes Relative: 6 %
Neutro Abs: 5.2 10*3/uL (ref 1.7–7.7)
Neutrophils Relative %: 81 %
Platelets: 137 10*3/uL — ABNORMAL LOW (ref 150–400)
RBC: 4.99 MIL/uL (ref 4.22–5.81)
RDW: 14.1 % (ref 11.5–15.5)
WBC: 6.4 10*3/uL (ref 4.0–10.5)
nRBC: 0 % (ref 0.0–0.2)

## 2023-10-05 MED ORDER — KETOROLAC TROMETHAMINE 30 MG/ML IJ SOLN
15.0000 mg | Freq: Once | INTRAMUSCULAR | Status: AC
  Administered 2023-10-05: 15 mg via INTRAVENOUS
  Filled 2023-10-05: qty 1

## 2023-10-05 MED ORDER — ONDANSETRON HCL 4 MG PO TABS: 4.0000 mg | ORAL_TABLET | Freq: Four times a day (QID) | ORAL | 0 refills | Status: DC

## 2023-10-05 MED ORDER — MORPHINE SULFATE (PF) 4 MG/ML IV SOLN
4.0000 mg | Freq: Once | INTRAVENOUS | Status: AC
  Administered 2023-10-05: 4 mg via INTRAVENOUS
  Filled 2023-10-05: qty 1

## 2023-10-05 MED ORDER — TAMSULOSIN HCL 0.4 MG PO CAPS: 0.4000 mg | ORAL_CAPSULE | Freq: Every day | ORAL | 0 refills | Status: DC

## 2023-10-05 MED ORDER — SODIUM CHLORIDE 0.9 % IV BOLUS
1000.0000 mL | Freq: Once | INTRAVENOUS | Status: AC
  Administered 2023-10-05: 1000 mL via INTRAVENOUS

## 2023-10-05 MED ORDER — ONDANSETRON HCL 4 MG/2ML IJ SOLN
4.0000 mg | Freq: Once | INTRAMUSCULAR | Status: AC
  Administered 2023-10-05: 4 mg via INTRAVENOUS
  Filled 2023-10-05: qty 2

## 2023-10-05 NOTE — Discharge Instructions (Signed)
 Return for any problem.  ?

## 2023-10-05 NOTE — ED Triage Notes (Signed)
 48 y/o male comes in c/o right sided flank pain that came on suddenly last evening. Pt has a hx of kidney stones in the past and states "it just like one." PT denies any fevers, chills, but reports nausea and one episode of vomiting. Pt denies taking anything at home for his pain or discomfort.

## 2023-10-05 NOTE — ED Notes (Signed)
 Patient transported to CT

## 2023-10-05 NOTE — ED Provider Notes (Signed)
 [place] EMERGENCY DEPARTMENT AT [facility] Provider Note   CSN: [id number] Arrival date & time: 10/05/23  4098     History  Chief Complaint  Patient presents with   Flank Pain    Reginald Campbell is a 48 y.o. male.  48 year old male with prior medical history as detailed below presents for evaluation.  Patient complains of right-sided flank pain.  He reports acute onset of pain early last night.  Patient's reported discomfort is similar to prior episodes of renal colic.  The history is provided by the patient and medical records.       Home Medications Prior to Admission medications   Medication Sig Start Date End Date Taking? Authorizing Provider  ALPRAZolam Prudy Feeler) 0.5 MG tablet Take 1 tablet (0.5 mg total) by mouth 3 (three) times daily as needed for sleep or anxiety (for use during  the sleep test). 09/03/23 09/02/24  Dohmeier, Porfirio Mylar, MD      Allergies    Penicillins    Review of Systems   Review of Systems  All other systems reviewed and are negative.   Physical Exam Updated Vital Signs BP 127/80 (BP Location: Left Arm)   Pulse 64   Temp 97.6 F (36.4 C) (Oral)   Resp 18   Ht 5\' 8"  (1.727 m)   Wt (!) 163.3 kg   SpO2 98%   BMI 54.74 kg/m  Physical Exam Vitals and nursing note reviewed.  Constitutional:      General: He is not in acute distress.    Appearance: Normal appearance. He is well-developed.  HENT:     Head: Normocephalic and atraumatic.  Eyes:     Conjunctiva/sclera: Conjunctivae normal.     Pupils: Pupils are equal, round, and reactive to light.  Cardiovascular:     Rate and Rhythm: Normal rate and regular rhythm.     Heart sounds: Normal heart sounds.  Pulmonary:     Effort: Pulmonary effort is normal. No respiratory distress.     Breath sounds: Normal breath sounds.  Abdominal:     General: There is no distension.     Palpations: Abdomen is soft.     Tenderness: There is no abdominal tenderness.  Musculoskeletal:         General: No deformity. Normal range of motion.     Cervical back: Normal range of motion and neck supple.  Skin:    General: Skin is warm and dry.  Neurological:     General: No focal deficit present.     Mental Status: He is alert and oriented to person, place, and time.     ED Results / Procedures / Treatments   Labs (all labs ordered are listed, but only abnormal results are displayed) Labs Reviewed  COMPREHENSIVE METABOLIC PANEL  CBC WITH DIFFERENTIAL/PLATELET  LIPASE, BLOOD    EKG None  Radiology No results found.  Procedures Procedures    Medications Ordered in ED Medications  sodium chloride 0.9 % bolus 1,000 mL (has no administration in time range)  ketorolac (TORADOL) 30 MG/ML injection 15 mg (has no administration in time range)  morphine (PF) 4 MG/ML injection 4 mg (has no administration in time range)  ondansetron (ZOFRAN) injection 4 mg (has no administration in time range)    ED Course/ Medical Decision Making/ A&P  Medical Decision Making Amount and/or Complexity of Data Reviewed Labs: ordered. Radiology: ordered.  Risk Prescription drug management.    Medical Screen Complete  This patient presented to the ED with complaint of right flank pain.  This complaint involves an extensive number of treatment options. The initial differential diagnosis includes, but is not limited to, renal colic  This presentation is: Acute, Self-Limited, Previously Undiagnosed, and Uncertain Prognosis  Patient is presenting with right flank pain.  Symptoms are consistent with prior renal colic.  CT imaging suggest recently passed ureteral stone on the right.  Patient much improved after ED evaluation and treatment.  Now desires discharge home.  He declines to provide UA for evaluation.  Importance of close follow-up is stressed.  Strict return precautions given and understood.  Co morbidities that complicated the patient's  evaluation  See HPI   Additional history obtained: External records from outside sources obtained and reviewed including prior ED visits and prior Inpatient records.    Lab Tests:  I ordered and personally interpreted labs.  The pertinent results include: CBC, CMP, lipase, UA   Imaging Studies ordered:  I ordered imaging studies including CT I independently visualized and interpreted obtained imaging which showed recently passed right ureteral stone I agree with the radiologist interpretation.   Problem List / ED Course:  Right flank pain   Reevaluation:  After the interventions noted above, I reevaluated the patient and found that they have: Resolved  Disposition:  After consideration of the diagnostic results and the patients response to treatment, I feel that the patent would benefit from close outpatient follow-up.          Final Clinical Impression(s) / ED Diagnoses Final diagnoses:  Flank pain    Rx / DC Orders ED Discharge Orders          Ordered    tamsulosin (FLOMAX) 0.4 MG CAPS capsule  Daily        10/05/23 0930    ondansetron (ZOFRAN) 4 MG tablet  Every 6 hours        10/05/23 0930              Wynetta Fines, MD 10/05/23 [phone]

## 2023-10-08 ENCOUNTER — Ambulatory Visit: Payer: Medicaid Other | Admitting: Neurology

## 2023-10-08 DIAGNOSIS — Z9189 Other specified personal risk factors, not elsewhere classified: Secondary | ICD-10-CM

## 2023-10-08 DIAGNOSIS — G4733 Obstructive sleep apnea (adult) (pediatric): Secondary | ICD-10-CM | POA: Diagnosis not present

## 2023-10-08 DIAGNOSIS — Z8669 Personal history of other diseases of the nervous system and sense organs: Secondary | ICD-10-CM

## 2023-10-08 DIAGNOSIS — F519 Sleep disorder not due to a substance or known physiological condition, unspecified: Secondary | ICD-10-CM

## 2023-10-08 DIAGNOSIS — E66813 Obesity, class 3: Secondary | ICD-10-CM

## 2023-10-08 DIAGNOSIS — F5104 Psychophysiologic insomnia: Secondary | ICD-10-CM

## 2023-10-18 DIAGNOSIS — F519 Sleep disorder not due to a substance or known physiological condition, unspecified: Secondary | ICD-10-CM | POA: Insufficient documentation
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
## 2023-10-28 ENCOUNTER — Telehealth: Payer: Self-pay

## 2023-10-28 NOTE — Telephone Encounter (Signed)
Called pt and LVM to give us a call back

## 2023-10-28 NOTE — Telephone Encounter (Signed)
-----   Message from McCook Dohmeier sent at 10/18/2023  2:51 PM EDT ----- Mr Ballantine slept surprisingly well once CPAP was in place,  after the baseline part of the SPLIT night did show severe sleep apnea, frequently waking up ( didn't bring his sleep aid  extra prescribed for the test ) his was mostly hypopnea ( hypoventilation) with a very low oxygen nadir.   RECOMMENDATIONS: Autotitration CPAP with an EVORA S/M full face mask by [employer], setting between 7 cm and 15 cm water, 2 cm EPR.  RV with NP or me in 90 days.   He needs to lose weight- the main risk factor for this patient is his excessive weight and he has chronic pain limiting his mobility.  He can sleep with a wedge pillow or in an adjusted bed or fold-down recliner, but he shouldn't sleep flat.   This patient is not on [facility], please contact him by phone .

## 2023-10-30 ENCOUNTER — Telehealth: Payer: Self-pay

## 2023-10-30 NOTE — Telephone Encounter (Signed)
-----   Message from McCook Dohmeier sent at 10/18/2023  2:51 PM EDT ----- Reginald Campbell slept surprisingly well once CPAP was in place,  after the baseline part of the SPLIT night did show severe sleep apnea, frequently waking up ( didn't bring his sleep aid  extra prescribed for the test ) his was mostly hypopnea ( hypoventilation) with a very low oxygen nadir.   RECOMMENDATIONS: Autotitration CPAP with an EVORA S/M full face mask by [employer], setting between 7 cm and 15 cm water, 2 cm EPR.  RV with NP or me in 90 days.   He needs to lose weight- the main risk factor for this patient is his excessive weight and he has chronic pain limiting his mobility.  He can sleep with a wedge pillow or in an adjusted bed or fold-down recliner, but he shouldn't sleep flat.   This patient is not on [facility], please contact him by phone .

## 2023-10-30 NOTE — Telephone Encounter (Signed)
 Called patient twice and LVm to give [place] a call back for his Sleep study results.

## 2023-10-31 NOTE — Telephone Encounter (Signed)
 Called pt and LVM to give [place] a call back for his Sleep study results.

## 2023-12-01 ENCOUNTER — Encounter (HOSPITAL_BASED_OUTPATIENT_CLINIC_OR_DEPARTMENT_OTHER): Payer: Self-pay | Admitting: Emergency Medicine

## 2023-12-01 ENCOUNTER — Other Ambulatory Visit: Payer: Self-pay

## 2023-12-01 ENCOUNTER — Emergency Department (HOSPITAL_BASED_OUTPATIENT_CLINIC_OR_DEPARTMENT_OTHER)
Admission: EM | Admit: 2023-12-01 | Discharge: 2023-12-01 | Disposition: A | Discharge status: Home / Self Care | Attending: Emergency Medicine | Admitting: Emergency Medicine

## 2023-12-01 DIAGNOSIS — R059 Cough, unspecified: Secondary | ICD-10-CM | POA: Diagnosis present

## 2023-12-01 DIAGNOSIS — J069 Acute upper respiratory infection, unspecified: Secondary | ICD-10-CM | POA: Diagnosis not present

## 2023-12-01 DIAGNOSIS — J45909 Unspecified asthma, uncomplicated: Secondary | ICD-10-CM | POA: Diagnosis not present

## 2023-12-01 DIAGNOSIS — I10 Essential (primary) hypertension: Secondary | ICD-10-CM | POA: Insufficient documentation

## 2023-12-01 DIAGNOSIS — B9789 Other viral agents as the cause of diseases classified elsewhere: Secondary | ICD-10-CM | POA: Diagnosis not present

## 2023-12-01 LAB — GROUP A STREP BY PCR: Group A Strep by PCR: NOT DETECTED

## 2023-12-01 LAB — RESP PANEL BY RT-PCR (RSV, FLU A&B, COVID)  RVPGX2
Influenza A by PCR: NEGATIVE
Influenza B by PCR: NEGATIVE
Resp Syncytial Virus by PCR: NEGATIVE
SARS Coronavirus 2 by RT PCR: NEGATIVE

## 2023-12-01 MED ORDER — KETOROLAC TROMETHAMINE 15 MG/ML IJ SOLN
15.0000 mg | Freq: Once | INTRAMUSCULAR | Status: AC
  Administered 2023-12-01: 15 mg via INTRAMUSCULAR
  Filled 2023-12-01: qty 1

## 2023-12-01 NOTE — ED Provider Notes (Signed)
 [place] [facility] AT [facility] Provider Note   CSN: [id number] Arrival date & time: 12/01/23  1151     History  Chief Complaint  Patient presents with   Sore Throat   Cough    Reginald Campbell is a 48 y.o. male with PMHx OA, asthma, depression, HTN who presents to ED concerned for congestion, cough, sore throat, and headache x4 days. Patient was vomiting 2 days ago which has since resolved. Patient taking dayquil which is not doing much for his symptoms. Symptoms started after going to the movie theater with daughter. Patient stating that he gets URI's frequently and also suffers from seasonal allergies.  Denies fever, chest pain, dyspnea.    Sore Throat  Cough      Home Medications Prior to Admission medications   Medication Sig Start Date End Date Taking? Authorizing Provider  ALPRAZolam  (XANAX ) 0.5 MG tablet Take 1 tablet (0.5 mg total) by mouth 3 (three) times daily as needed for sleep or anxiety (for use during  the sleep test). 09/03/23 09/02/24  Dohmeier, Raoul Byes, MD  ondansetron  (ZOFRAN ) 4 MG tablet Take 1 tablet (4 mg total) by mouth every 6 (six) hours. 10/05/23   Burnette Carte, MD  tamsulosin  (FLOMAX ) 0.4 MG CAPS capsule Take 1 capsule (0.4 mg total) by mouth daily. 10/05/23   Burnette Carte, MD      Allergies    Penicillins    Review of Systems   Review of Systems  Respiratory:  Positive for cough.     Physical Exam Updated Vital Signs BP 125/85   Pulse 93   Temp 98.7 F (37.1 C)   Resp 20   Ht 5\' 8"  (1.727 m)   Wt (!) 163.3 kg   SpO2 98%   BMI 54.74 kg/m  Physical Exam Vitals and nursing note reviewed.  Constitutional:      General: He is not in acute distress.    Appearance: He is not ill-appearing or toxic-appearing.  HENT:     Head: Normocephalic and atraumatic.     Comments: No frontal or maxillary sinus tenderness to palpation    Mouth/Throat:     Mouth: Mucous membranes are moist.     Pharynx: No oropharyngeal  exudate or posterior oropharyngeal erythema.     Tonsils: No tonsillar exudate. 1+ on the right. 1+ on the left.     Comments: Mild erythema of the posterior oropharynx.  No tonsillar exudates or swelling. Eyes:     General: No scleral icterus.       Right eye: No discharge.        Left eye: No discharge.     Conjunctiva/sclera: Conjunctivae normal.  Cardiovascular:     Rate and Rhythm: Normal rate and regular rhythm.     Pulses: Normal pulses.     Heart sounds: Normal heart sounds. No murmur heard. Pulmonary:     Effort: Pulmonary effort is normal. No respiratory distress.     Breath sounds: Normal breath sounds. No wheezing, rhonchi or rales.  Abdominal:     General: Bowel sounds are normal. There is no distension.     Palpations: Abdomen is soft. There is no mass.     Tenderness: There is no abdominal tenderness.  Musculoskeletal:     Right lower leg: No edema.     Left lower leg: No edema.  Skin:    General: Skin is warm and dry.     Findings: No rash.  Neurological:  General: No focal deficit present.     Mental Status: He is alert and oriented to person, place, and time. Mental status is at baseline.     Comments: GCS 15. Speech is goal oriented. No deficits appreciated to CN III-XII. Patient moves extremities without ataxia.   Psychiatric:        Mood and Affect: Mood normal.        Behavior: Behavior normal.     ED Results / Procedures / Treatments   Labs (all labs ordered are listed, but only abnormal results are displayed) Labs Reviewed  RESP PANEL BY RT-PCR (RSV, FLU A&B, COVID)  [id number]  GROUP A STREP BY PCR    EKG None  Radiology No results found.  Procedures Procedures    Medications Ordered in ED Medications  ketorolac  (TORADOL ) 15 MG/ML injection 15 mg (15 mg Intramuscular Given 12/01/23 1313)    ED Course/ Medical Decision Making/ A&P                                 Medical Decision Making Risk Prescription drug management.    This  patient presents to the ED for concern of cough, congestion, sore throat, headache, this involves an extensive number of treatment options, and is a complaint that carries with it a high risk of complications and morbidity.  The differential diagnosis includes Flu/COVID/RSV, strep pharyngitis, sinusitis, peritonsillar abscess, retropharyngeal abscess, pneumonia, meningitis.   Co morbidities that complicate the patient evaluation  OA, asthma, depression, HTN   Additional history obtained:  Dr. De Peru PCP   Problem List / ED Course / Critical interventions / Medication management  Presents to ED concern for congestion, headache, cough, sore throat x 4 days.  Symptoms started after going to the movie theater's with his daughter.  Physical exam reassuring.  Patient afebrile with stable vitals. I Ordered, and personally interpreted labs.  Respiratory panel negative.  PCR negative. Shared all results with patient.  Answered all questions.  Appears a patient is suffering from a viral URI.  Recommended following up with PCP. Patient was educated on alternating between 650 mg Tylenol  and 400 mg ibuprofen  every 3 hours as needed for headaches/body aches.  Offered patient Toradol  shot while in ED which he agreed to.  I have reviewed the patients home medicines and have made adjustments as needed The patient has been appropriately medically screened and/or stabilized in the ED. I have low suspicion for any other emergent medical condition which would require further screening, evaluation or treatment in the ED or require inpatient management. At time of discharge the patient is hemodynamically stable and in no acute distress. I have discussed work-up results and diagnosis with patient and answered all questions. Patient is agreeable with discharge plan. We discussed strict return precautions for returning to the emergency department and they verbalized understanding.     Social Determinants of  Health:  none         Final Clinical Impression(s) / ED Diagnoses Final diagnoses:  Viral URI with cough    Rx / DC Orders ED Discharge Orders     None         [place] Bureau, [place] 12/01/23 1324    Lowery Rue, DO 12/01/23 1430

## 2023-12-01 NOTE — ED Triage Notes (Signed)
 Pt with cough, sore throat, HA x 4d

## 2023-12-01 NOTE — Discharge Instructions (Signed)
 It was a pleasure caring for you today.  Please follow with your primary care provider.  Seek emergency care if experiencing any new or worsening symptoms.  Alternating between 650 mg Tylenol  and 400 mg Advil : The best way to alternate taking Acetaminophen  (example Tylenol ) and Ibuprofen  (example Advil /Motrin ) is to take them 3 hours apart. For example, if you take ibuprofen  at 6 am you can then take Tylenol  at 9 am. You can continue this regimen throughout the day, making sure you do not exceed the recommended maximum dose for each drug.

## 2023-12-05 ENCOUNTER — Encounter (HOSPITAL_COMMUNITY): Payer: Self-pay | Admitting: *Deleted

## 2023-12-05 ENCOUNTER — Emergency Department (HOSPITAL_COMMUNITY)

## 2023-12-05 ENCOUNTER — Other Ambulatory Visit (HOSPITAL_COMMUNITY): Payer: Self-pay | Admitting: Neurosurgery

## 2023-12-05 ENCOUNTER — Other Ambulatory Visit: Payer: Self-pay

## 2023-12-05 ENCOUNTER — Emergency Department (HOSPITAL_COMMUNITY)
Admission: EM | Admit: 2023-12-05 | Discharge: 2023-12-06 | Disposition: A | Discharge status: Home / Self Care | Attending: Emergency Medicine | Admitting: Emergency Medicine

## 2023-12-05 DIAGNOSIS — Z87891 Personal history of nicotine dependence: Secondary | ICD-10-CM | POA: Diagnosis not present

## 2023-12-05 DIAGNOSIS — R0789 Other chest pain: Secondary | ICD-10-CM | POA: Diagnosis not present

## 2023-12-05 DIAGNOSIS — R112 Nausea with vomiting, unspecified: Secondary | ICD-10-CM | POA: Diagnosis not present

## 2023-12-05 DIAGNOSIS — J069 Acute upper respiratory infection, unspecified: Secondary | ICD-10-CM | POA: Insufficient documentation

## 2023-12-05 DIAGNOSIS — D1779 Benign lipomatous neoplasm of other sites: Secondary | ICD-10-CM

## 2023-12-05 DIAGNOSIS — B9789 Other viral agents as the cause of diseases classified elsewhere: Secondary | ICD-10-CM | POA: Diagnosis not present

## 2023-12-05 DIAGNOSIS — R059 Cough, unspecified: Secondary | ICD-10-CM | POA: Diagnosis not present

## 2023-12-05 DIAGNOSIS — R110 Nausea: Secondary | ICD-10-CM

## 2023-12-05 LAB — CBC
HCT: 43.1 % (ref 39.0–52.0)
Hemoglobin: 13.9 g/dL (ref 13.0–17.0)
MCH: 28.4 pg (ref 26.0–34.0)
MCHC: 32.3 g/dL (ref 30.0–36.0)
MCV: 88.1 fL (ref 80.0–100.0)
Platelets: 188 10*3/uL (ref 150–400)
RBC: 4.89 MIL/uL (ref 4.22–5.81)
RDW: 14.3 % (ref 11.5–15.5)
WBC: 10.2 10*3/uL (ref 4.0–10.5)
nRBC: 0 % (ref 0.0–0.2)

## 2023-12-05 LAB — BASIC METABOLIC PANEL WITH GFR
Anion gap: 11 (ref 5–15)
BUN: 12 mg/dL (ref 6–20)
CO2: 24 mmol/L (ref 22–32)
Calcium: 8.5 mg/dL — ABNORMAL LOW (ref 8.9–10.3)
Chloride: 101 mmol/L (ref 98–111)
Creatinine, Ser: 0.87 mg/dL (ref 0.61–1.24)
GFR, Estimated: >60 mL/min (ref >60)
Glucose, Bld: 107 mg/dL — ABNORMAL HIGH (ref 70–99)
Potassium: 3.8 mmol/L (ref 3.5–5.1)
Sodium: 136 mmol/L (ref 135–145)

## 2023-12-05 LAB — TROPONIN I (HIGH SENSITIVITY): Troponin I (High Sensitivity): 3 ng/L (ref <18)

## 2023-12-05 NOTE — ED Provider Triage Note (Signed)
 Emergency Medicine Provider Triage Evaluation Note  Fransisco Jack , a 48 y.o. male  was evaluated in triage.  Pt complains of viral URI with cough now feeling worse and having upper CP with cough. No sob or fever.  Review of Systems  Positive: CP Negative: Abd pain  Physical Exam  BP (!) 150/83 (BP Location: Right Arm)   Pulse 89   Temp 98.4 F (36.9 C)   Resp 18   SpO2 98%  Gen:   Awake, no distress   Resp:  Normal effort  MSK:   Moves extremities without difficulty  Other:    Medical Decision Making  Medically screening exam initiated at 8:51 PM.  Appropriate orders placed.  Fransisco Jack was informed that the remainder of the evaluation will be completed by another provider, this initial triage assessment does not replace that evaluation, and the importance of remaining in the ED until their evaluation is complete.     Eudora Heron, Kirby Peoples 12/05/23 2051

## 2023-12-05 NOTE — ED Triage Notes (Signed)
 Pt reports for a week he has had tightness in his chest, sometimes [name] and stabbing. URI diagnosed on the 4th. Endorses now having n/v. Still having a cough.

## 2023-12-06 LAB — TROPONIN I (HIGH SENSITIVITY): Troponin I (High Sensitivity): 3 ng/L (ref <18)

## 2023-12-06 MED ORDER — BENZONATATE 100 MG PO CAPS: 100.0000 mg | ORAL_CAPSULE | Freq: Three times a day (TID) | ORAL | 0 refills | Status: DC

## 2023-12-06 NOTE — Discharge Instructions (Signed)
 Your workup tonight was reassuring.  You may take the prescribed medication for your cough.  You may also take your previously prescribed Zofran  for nausea as needed.  If you develop any life-threatening symptoms please return to the emergency department.

## 2023-12-06 NOTE — ED Provider Notes (Signed)
 [place] EMERGENCY DEPARTMENT AT [facility] Provider Note   CSN: [id number] Arrival date & time: 12/05/23  1903     History  Chief Complaint  Patient presents with   Chest Pain    Reginald Campbell is a 48 y.o. male.  Patient presents to the emergency room complaining of persistent cough with 2 episodes of nausea and vomiting.  He states he has had a cough for the past 7 days and was diagnosed with an upper respiratory infection on the fourth of this month.  He denies chest pain at this time, denies emesis since being at the emergency department, abdominal pain, shortness of breath.  Past medical history significant OSA, morbid obesity   Chest Pain      Home Medications Prior to Admission medications   Medication Sig Start Date End Date Taking? Authorizing Provider  benzonatate (TESSALON) 100 MG capsule Take 1 capsule (100 mg total) by mouth every 8 (eight) hours. 12/06/23  Yes Elisa Guest, PA-C  ALPRAZolam  (XANAX ) 0.5 MG tablet Take 1 tablet (0.5 mg total) by mouth 3 (three) times daily as needed for sleep or anxiety (for use during  the sleep test). 09/03/23 09/02/24  Dohmeier, Raoul Byes, MD  ondansetron  (ZOFRAN ) 4 MG tablet Take 1 tablet (4 mg total) by mouth every 6 (six) hours. 10/05/23   Burnette Carte, MD  tamsulosin  (FLOMAX ) 0.4 MG CAPS capsule Take 1 capsule (0.4 mg total) by mouth daily. 10/05/23   Burnette Carte, MD      Allergies    Penicillins    Review of Systems   Review of Systems  Cardiovascular:  Positive for chest pain.    Physical Exam Updated Vital Signs BP (!) 152/109   Pulse 84   Temp (!) 97.4 F (36.3 C) (Oral)   Resp 19   Ht 5\' 8"  (1.727 m)   Wt (!) 163.3 kg   SpO2 95%   BMI 54.74 kg/m  Physical Exam Vitals and nursing note reviewed.  Constitutional:      General: He is not in acute distress.    Appearance: He is well-developed. He is obese.  HENT:     Head: Normocephalic and atraumatic.  Eyes:     Conjunctiva/sclera:  Conjunctivae normal.  Cardiovascular:     Rate and Rhythm: Normal rate and regular rhythm.     Heart sounds: No murmur heard. Pulmonary:     Effort: Pulmonary effort is normal. No respiratory distress.     Breath sounds: No wheezing, rhonchi or rales.  Chest:     Chest wall: No tenderness.  Abdominal:     Palpations: Abdomen is soft.     Tenderness: There is no abdominal tenderness.  Musculoskeletal:        General: No swelling.     Cervical back: Neck supple.  Skin:    General: Skin is warm and dry.     Capillary Refill: Capillary refill takes less than 2 seconds.  Neurological:     Mental Status: He is alert.  Psychiatric:        Mood and Affect: Mood normal.     ED Results / Procedures / Treatments   Labs (all labs ordered are listed, but only abnormal results are displayed) Labs Reviewed  BASIC METABOLIC PANEL WITH GFR - Abnormal; Notable for the following components:      Result Value   Glucose, Bld 107 (*)    Calcium 8.5 (*)    All other components within normal  limits  CBC  TROPONIN I (HIGH SENSITIVITY)  TROPONIN I (HIGH SENSITIVITY)    EKG EKG Interpretation Date/Time:  Thursday Dec 05 2023 20:39:15 EDT Ventricular Rate:  86 PR Interval:  152 QRS Duration:  94 QT Interval:  363 QTC Calculation: 435 R Axis:   76  Text Interpretation: Sinus rhythm Normal ECG When compared with ECG of 05/06/2021, No significant change was found Confirmed by Alissa April ([id number]) on 12/06/2023 1:35:07 AM  Radiology DG Chest 2 View Result Date: 12/05/2023 CLINICAL DATA:  Cough EXAM: CHEST - 2 VIEW COMPARISON:  05/06/2021 FINDINGS: The heart size and mediastinal contours are within normal limits. Both lungs are clear. The visualized skeletal structures are unremarkable. IMPRESSION: No active cardiopulmonary disease. Electronically Signed   By: Violeta Grey M.D.   On: 12/05/2023 21:17    Procedures Procedures    Medications Ordered in ED Medications - No data to display  ED  Course/ Medical Decision Making/ A&P                                 Medical Decision Making Amount and/or Complexity of Data Reviewed Labs: ordered. Radiology: ordered.   This patient presents to the ED for concern of cough and chest congestion, nausea/vomiting, this involves an extensive number of treatment options, and is a complaint that carries with it a high risk of complications and morbidity.  The differential diagnosis includes upper respiratory infection, pneumonia, ACS, others   Co morbidities that complicate the patient evaluation  Obesity, current respiratory infection   Lab Tests:  I Ordered, and personally interpreted labs.  The pertinent results include: Troponin 3, unremarkable BMP, CBC   Imaging Studies ordered:  I ordered imaging studies including chest x-ray I independently visualized and interpreted imaging which showed no active disease I agree with the radiologist interpretation   Cardiac Monitoring: / EKG:  The patient was maintained on a cardiac monitor.  I personally viewed and interpreted the cardiac monitored which showed an underlying rhythm of: Sinus rhythm   Social Determinants of Health:  Patient is a former smoker, has Medicaid for his primary health insurance type   Test / Admission - Considered:  Patient with no acute findings on imaging or lab work this evening.  Lung sounds are clear to auscultation bilaterally.  Patient does have a cough, plan to prescribe Tessalon for cough relief.  Patient endorses nausea but had no episodes of emesis while in the emergency department for approximately 7 hours.  Patient states he has Zofran  at home which she can take for nausea.  Plan to discharge home at this time in stable condition.         Final Clinical Impression(s) / ED Diagnoses Final diagnoses:  Viral URI with cough  Nausea    Rx / DC Orders ED Discharge Orders          Ordered    benzonatate (TESSALON) 100 MG capsule  Every  8 hours        12/06/23 0237              Elisa Guest, PA-C 12/06/23 0237    Alissa April, MD 12/06/23 [phone]

## 2023-12-12 ENCOUNTER — Ambulatory Visit (HOSPITAL_COMMUNITY)
Admission: RE | Admit: 2023-12-12 | Discharge: 2023-12-12 | Disposition: A | Discharge status: Home / Self Care | Source: Ambulatory Visit | Attending: Neurosurgery | Admitting: Neurosurgery

## 2023-12-12 DIAGNOSIS — D1779 Benign lipomatous neoplasm of other sites: Secondary | ICD-10-CM | POA: Diagnosis not present

## 2023-12-19 ENCOUNTER — Encounter (HOSPITAL_BASED_OUTPATIENT_CLINIC_OR_DEPARTMENT_OTHER): Payer: Self-pay | Admitting: Emergency Medicine

## 2023-12-19 ENCOUNTER — Emergency Department (HOSPITAL_BASED_OUTPATIENT_CLINIC_OR_DEPARTMENT_OTHER)
Admission: EM | Admit: 2023-12-19 | Discharge: 2023-12-19 | Disposition: A | Discharge status: Home / Self Care | Attending: Emergency Medicine | Admitting: Emergency Medicine

## 2023-12-19 ENCOUNTER — Other Ambulatory Visit: Payer: Self-pay

## 2023-12-19 DIAGNOSIS — H9311 Tinnitus, right ear: Secondary | ICD-10-CM | POA: Diagnosis present

## 2023-12-19 DIAGNOSIS — H6123 Impacted cerumen, bilateral: Secondary | ICD-10-CM | POA: Insufficient documentation

## 2023-12-19 NOTE — ED Provider Notes (Signed)
 [place] EMERGENCY DEPARTMENT AT [facility] Provider Note   CSN: [id number] Arrival date & time: 12/19/23  1556     History  Chief Complaint  Patient presents with   Ear Fullness    Reginald Campbell is a 48 y.o. male with no pertinent past medical history presented with bilateral ear tinnitus along with fullness in his right ear for the past few days.  Patient denies any fevers, discharge, redness or swelling on the ear or behind the ear.  Patient be able to eat and drink without issue.  Patient has taken any medications for this.  Patient denies any neck pain or altered mental status or headaches.   Home Medications Prior to Admission medications   Medication Sig Start Date End Date Taking? Authorizing Provider  ALPRAZolam  (XANAX ) 0.5 MG tablet Take 1 tablet (0.5 mg total) by mouth 3 (three) times daily as needed for sleep or anxiety (for use during  the sleep test). 09/03/23 09/02/24  Dohmeier, Raoul Byes, MD  benzonatate  (TESSALON ) 100 MG capsule Take 1 capsule (100 mg total) by mouth every 8 (eight) hours. 12/06/23   Elisa Guest, PA-C  ondansetron  (ZOFRAN ) 4 MG tablet Take 1 tablet (4 mg total) by mouth every 6 (six) hours. 10/05/23   Burnette Carte, MD  tamsulosin  (FLOMAX ) 0.4 MG CAPS capsule Take 1 capsule (0.4 mg total) by mouth daily. 10/05/23   Burnette Carte, MD      Allergies    Penicillins    Review of Systems   Review of Systems  Physical Exam Updated Vital Signs BP 122/88   Pulse 75   Temp 97.9 F (36.6 C) (Oral)   Resp 16   Ht 5\' 8"  (1.727 m)   Wt (!) 164.7 kg   SpO2 97%   BMI 55.19 kg/m  Physical Exam Constitutional:      General: He is not in acute distress. HENT:     Ears:     Comments: Impacted cerumen bilaterally No mastoid tenderness or ecchymosis or fluctuance noted No tragal tenderness bilaterally No signs of necrotizing otitis externa Musculoskeletal:     Cervical back: Normal range of motion. No rigidity.  Neurological:      Mental Status: He is alert.     ED Results / Procedures / Treatments   Labs (all labs ordered are listed, but only abnormal results are displayed) Labs Reviewed - No data to display  EKG None  Radiology No results found.  Procedures Procedures    Medications Ordered in ED Medications - No data to display  ED Course/ Medical Decision Making/ A&P                                 Medical Decision Making  Reginald Campbell 48 y.o. presented today for otalgia.  Working DDx that I considered at this time includes, but not limited to, cerumen impaction, AOM, otitis externa, URI, necrotizing otitis externa foreign body, auricular hematoma/perichondritis, membrane perforation, mastoiditis.  R/o DDx: AOM, otitis externa, URI, necrotizing otitis externa foreign body, auricular hematoma/perichondritis, panic membrane perforation, mastoiditis: These are considered less likely due to history of present illness, physical exam, labs/imaging findings  Review of prior external notes: 07/18/2023 office visit  Unique Tests and My Independent Interpretation: None  Social Determinants of Health: none  Discussion with Independent Historian: None  Discussion of Management of Tests: None  Risk: Low: based on diagnostic testing/clinical impression  and treatment plan  Risk Stratification Score: None  Plan: On exam patient was no acute distress with stable vitals.  On exam patient does have impacted cerumen bilaterally which I do believe is causing his symptoms however will have the nurse irrigate his ears and reevaluate.  Patient endorsing any infectious symptoms and has stable vitals here and so we will reevaluate ears and if reassuring exam will have him follow-up with his primary care provider.  On reassessment patient's eardrums do appear full of fluid however are not erythematous, bulging, purulent.  At this time I did recommend patient takes antihistamines like Claritin or Zyrtec  daily and  follow-up with his primary care provider.  Patient does feel slightly better after having his earwax removed as well as I do believe is contributing to his symptoms.  Patient was given return precautions. Patient stable for discharge at this time.  Patient verbalized understanding of plan.  This chart was dictated using voice recognition software.  Despite best efforts to proofread,  errors can occur which can change the documentation meaning        Final Clinical Impression(s) / ED Diagnoses Final diagnoses:  Bilateral impacted cerumen    Rx / DC Orders ED Discharge Orders     None         Reginald Campbell, Reginald Campbell 12/19/23 1818    Tegeler, Marine Sia, MD 12/19/23 [phone]

## 2023-12-19 NOTE — ED Triage Notes (Signed)
 Pt POV c/o R ear ringing and pressure x2 weeks. Denies drainage. Denies fever, sore throat.

## 2023-12-19 NOTE — Discharge Instructions (Signed)
 Please follow-up with your primary care provider.  Today your exam shows that you have cerumen impaction along with fluid behind the ears but no signs of infection.  Please take Claritin or Zyrtec  daily and follow-up with your primary care provider.  If symptoms change or worsen please return to the ER.

## 2023-12-19 NOTE — ED Notes (Addendum)
 Removed wax from right ear with elephant wash Reported off to [facility]  Patient stated that the right ear had a small ringing sound before the elephant wash also: patient stated the right ear still has the same small ringing after the wash

## 2023-12-27 ENCOUNTER — Emergency Department (HOSPITAL_COMMUNITY)
Admission: EM | Admit: 2023-12-27 | Discharge: 2023-12-27 | Disposition: A | Discharge status: Home / Self Care | Attending: Emergency Medicine | Admitting: Emergency Medicine

## 2023-12-27 ENCOUNTER — Other Ambulatory Visit: Payer: Self-pay

## 2023-12-27 DIAGNOSIS — H938X3 Other specified disorders of ear, bilateral: Secondary | ICD-10-CM | POA: Diagnosis not present

## 2023-12-27 DIAGNOSIS — H6983 Other specified disorders of Eustachian tube, bilateral: Secondary | ICD-10-CM | POA: Diagnosis not present

## 2023-12-27 DIAGNOSIS — H6993 Unspecified Eustachian tube disorder, bilateral: Secondary | ICD-10-CM

## 2023-12-27 MED ORDER — METHYLPREDNISOLONE 4 MG PO TBPK: ORAL_TABLET | ORAL | 0 refills | Status: DC

## 2023-12-27 NOTE — ED Provider Notes (Signed)
 [place] EMERGENCY DEPARTMENT AT [facility] Provider Note   CSN: [id number] Arrival date & time: 12/27/23  4098     History  Chief Complaint  Patient presents with   Cerumen Impaction    Pt reports he saw pcp recently for ear pain on (R) side and dx c cerumen impaction. At that time ear was popping, was given loratadine and sent home. Now, L ear has started popping, and R ear has tinnitus. URI 3 weeks ago    Reginald Campbell is a 48 y.o. male who presents emergency department a chief complaint of ear fullness.  Patient reports that he had a URI a few weeks ago and since that time has popping behind his ears, sensation of fullness, intermittent decreased hearing.  He states "it feels like I am underwater.  He denies significant nasal congestion.  Patient reports he went to an urgent care was told to take Claritin which he has been taking for the past week without relief of symptoms he denies pain.  HPI     Home Medications Prior to Admission medications   Medication Sig Start Date End Date Taking? Authorizing Provider  ALPRAZolam  (XANAX ) 0.5 MG tablet Take 1 tablet (0.5 mg total) by mouth 3 (three) times daily as needed for sleep or anxiety (for use during  the sleep test). 09/03/23 09/02/24  Dohmeier, Raoul Byes, MD  benzonatate  (TESSALON ) 100 MG capsule Take 1 capsule (100 mg total) by mouth every 8 (eight) hours. 12/06/23   Elisa Guest, PA-C  ondansetron  (ZOFRAN ) 4 MG tablet Take 1 tablet (4 mg total) by mouth every 6 (six) hours. 10/05/23   Burnette Carte, MD  tamsulosin  (FLOMAX ) 0.4 MG CAPS capsule Take 1 capsule (0.4 mg total) by mouth daily. 10/05/23   Burnette Carte, MD      Allergies    Penicillins    Review of Systems   Review of Systems  Physical Exam Updated Vital Signs BP (!) 140/90 (BP Location: Right Arm)   Pulse 74   Temp 98 F (36.7 C) (Oral)   Resp 16   SpO2 98%  Physical Exam Vitals and nursing note reviewed.  Constitutional:      General: He  is not in acute distress.    Appearance: He is well-developed. He is not diaphoretic.  HENT:     Head: Normocephalic and atraumatic.     Right Ear: Tympanic membrane, ear canal and external ear normal.     Left Ear: Tympanic membrane, ear canal and external ear normal.  Eyes:     General: No scleral icterus.    Conjunctiva/sclera: Conjunctivae normal.  Cardiovascular:     Rate and Rhythm: Normal rate and regular rhythm.     Heart sounds: Normal heart sounds.  Pulmonary:     Effort: Pulmonary effort is normal. No respiratory distress.     Breath sounds: Normal breath sounds.  Abdominal:     Palpations: Abdomen is soft.     Tenderness: There is no abdominal tenderness.  Musculoskeletal:     Cervical back: Normal range of motion and neck supple.  Skin:    General: Skin is warm and dry.  Neurological:     Mental Status: He is alert.  Psychiatric:        Behavior: Behavior normal.     ED Results / Procedures / Treatments   Labs (all labs ordered are listed, but only abnormal results are displayed) Labs Reviewed - No data to display  EKG  None  Radiology No results found.  Procedures Procedures    Medications Ordered in ED Medications - No data to display  ED Course/ Medical Decision Making/ A&P                                 Medical Decision Making  Patient here with ear fullness and pain.  Differential diagnosis includes otitis externa, otitis media, cerumen impaction, eustachian tube dysfunction.  After evaluation patient does not appear to have any signs of infection.  He does not have cerumen impaction.  Given recent URI suspect eustachian tube dysfunction.  Will discharge patient with a Medrol  Dosepak.  Discussed outpatient follow-up and return precautions.  He may use over-the-counter nasal decongestants.        Final Clinical Impression(s) / ED Diagnoses Final diagnoses:  None    Rx / DC Orders ED Discharge Orders     None         Tama Fails, PA-C 12/27/23 4098    Arvilla Birmingham, MD 12/27/23 1213

## 2023-12-27 NOTE — Discharge Instructions (Signed)
Contact a health care provider if: Your symptoms do not go away after treatment. Your symptoms come back after treatment. You are unable to pop your ears. You have: A fever. Pain in your ear. Pain in your head or neck. Fluid draining from your ear. Your hearing suddenly changes. You become very dizzy. You lose your balance. Get help right away if: You have a sudden, severe increase in any of your symptoms. 

## 2024-01-06 ENCOUNTER — Ambulatory Visit: Payer: Self-pay

## 2024-01-06 NOTE — Telephone Encounter (Signed)
 Pt has an upcoming appt 6/11. Routing to Dr. De Peru as an Arlie Lain.

## 2024-01-06 NOTE — Telephone Encounter (Signed)
 FYI Only or Action Required?: FYI only for provider  Patient was last seen in primary care on 08/05/2023 by de Peru, Alonza Jansky, MD. Called Nurse Triage reporting Ear Fullness. Symptoms began 2 weeks ago. Interventions attempted: Prescription medications: steriods. Symptoms are: unchanged.  Triage Disposition: See PCP When Office is Open (Within 3 Days)  Patient/caregiver understands and will follow disposition?: Yes Reason for Disposition  Ear congestion present > 48 hours  Answer Assessment - Initial Assessment Questions 1. LOCATION: "Which ear is involved?"       Right ear 2. SENSATION: "Describe how the ear feels." (e.g. stuffy, full, plugged)."      Full and popping 3. ONSET:  "When did the ear symptoms start?"       2 weeks 4. PAIN: "Do you also have an earache?" If Yes, ask: "How bad is it?" (Scale 1-10; or mild, moderate, severe)     no 5. CAUSE: "What do you think is causing the ear congestion?"     no 6. URI: "Do you have a runny nose or cough?"      no 7. NASAL ALLERGIES: "Are there symptoms of hay fever, such as sneezing or a clear nasal discharge?"     no  Protocols used: Ear - Congestion-A-AH

## 2024-01-08 ENCOUNTER — Encounter (HOSPITAL_BASED_OUTPATIENT_CLINIC_OR_DEPARTMENT_OTHER): Payer: Self-pay | Admitting: Family Medicine

## 2024-01-08 ENCOUNTER — Ambulatory Visit (HOSPITAL_BASED_OUTPATIENT_CLINIC_OR_DEPARTMENT_OTHER): Admitting: Family Medicine

## 2024-01-08 VITALS — BP 122/84 | HR 63 | Ht 70.0 in | Wt 370.8 lb

## 2024-01-08 DIAGNOSIS — H6990 Unspecified Eustachian tube disorder, unspecified ear: Secondary | ICD-10-CM | POA: Insufficient documentation

## 2024-01-08 MED ORDER — CETIRIZINE HCL 10 MG PO TABS: 10.0000 mg | ORAL_TABLET | Freq: Every day | ORAL | 2 refills | Status: DC

## 2024-01-08 MED ORDER — FLUTICASONE PROPIONATE 50 MCG/ACT NA SUSP: 1.0000 | Freq: Every day | NASAL | 1 refills | Status: DC

## 2024-01-08 NOTE — Patient Instructions (Signed)
?  Medication Instructions:  ?Your physician recommends that you continue on your current medications as directed. Please refer to the Current Medication list given to you today. ?--If you need a refill on any your medications before your next appointment, please call your pharmacy first. If no refills are authorized on file call the office.-- ? ?Follow-Up: ?Your next appointment:   ?Your physician recommends that you schedule a follow-up appointment in: PRN with Dr. de Cuba ? ?You will receive a text message or e-mail with a link to a survey about your care and experience with us today! We would greatly appreciate your feedback!  ? ?Thanks for letting us be apart of your health journey!!  ?Primary Care and Sports Medicine  ? ?Dr. Raymond de Cuba  ? ?We encourage you to activate your patient portal called "MyChart".  Sign up information is provided on this After Visit Summary.  MyChart is used to connect with patients for Virtual Visits (Telemedicine).  Patients are able to view lab/test results, encounter notes, upcoming appointments, etc.  Non-urgent messages can be sent to your provider as well. To learn more about what you can do with MyChart, please visit --  [email or web address].    ?

## 2024-01-08 NOTE — Progress Notes (Signed)
    Procedures performed today:    None.  Independent interpretation of notes and tests performed by another provider:   None.  Brief History, Exam, Impression, and Recommendations:    BP 122/84 (BP Location: Right Arm, Patient Position: Sitting, Cuff Size: Large)   Pulse 63   Ht 5' 10 (1.778 m)   Wt (!) 370 lb 12.8 oz (168.2 kg)   SpO2 97%   BMI 53.20 kg/m   Dysfunction of Eustachian tube, unspecified laterality Assessment & Plan: Patient was seen recently in the emergency department related to ongoing ear issues.  Initially was found to have impacted cerumen and this was treated in the emergency department.  He subsequently was having symptoms of ear fullness and had reevaluation in the emergency department which generally was reassuring and he was diagnosed with eustachian tube dysfunction.  He was recommended to utilize oral antihistamine.  He has used this but is not currently taking any medication.  He continues to have symptoms of ear fullness, mild pain.  Right is slightly worse than left.  He does not have any changes in hearing.  Pain is generally not in nature.  Denies any significant sinus congestion.  Denies any balance or gait issues. On exam, bilateral external auditory canals are clear.  Normal-appearing tympanic membranes, no significant area edema, bulging noted.  Discussed considerations today.  I do feel that continuing with oral antihistamine would be reasonable, prescription sent to pharmacy so the patient may continue with this.  Also discussed consideration for intranasal steroid spray as well as nasal saline spray.  Given association with acute issue, likelihood is that symptoms should gradually improve over time.  We discussed consideration related to referral, patient would like to proceed with this in order to have further evaluation should symptoms not improve as expected, referral placed today  Orders: -     Ambulatory referral to ENT  Other orders -      Cetirizine  HCl; Take 1 tablet (10 mg total) by mouth daily.  Dispense: 30 tablet; Refill: 2 -     Fluticasone  Propionate; Place 1 spray into both nostrils daily.  Dispense: 16 g; Refill: 1  Return if symptoms worsen or fail to improve.   ___________________________________________ Syriah Delisi de Peru, MD, ABFM, CAQSM Primary Care and Sports Medicine [facility]

## 2024-01-08 NOTE — Assessment & Plan Note (Signed)
 Patient was seen recently in the emergency department related to ongoing ear issues.  Initially was found to have impacted cerumen and this was treated in the emergency department.  He subsequently was having symptoms of ear fullness and had reevaluation in the emergency department which generally was reassuring and he was diagnosed with eustachian tube dysfunction.  He was recommended to utilize oral antihistamine.  He has used this but is not currently taking any medication.  He continues to have symptoms of ear fullness, mild pain.  Right is slightly worse than left.  He does not have any changes in hearing.  Pain is generally not in nature.  Denies any significant sinus congestion.  Denies any balance or gait issues. On exam, bilateral external auditory canals are clear.  Normal-appearing tympanic membranes, no significant area edema, bulging noted.  Discussed considerations today.  I do feel that continuing with oral antihistamine would be reasonable, prescription sent to pharmacy so the patient may continue with this.  Also discussed consideration for intranasal steroid spray as well as nasal saline spray.  Given association with acute issue, likelihood is that symptoms should gradually improve over time.  We discussed consideration related to referral, patient would like to proceed with this in order to have further evaluation should symptoms not improve as expected, referral placed today

## 2024-01-20 DIAGNOSIS — D1779 Benign lipomatous neoplasm of other sites: Secondary | ICD-10-CM | POA: Diagnosis not present

## 2024-01-20 DIAGNOSIS — M5440 Lumbago with sciatica, unspecified side: Secondary | ICD-10-CM | POA: Diagnosis not present

## 2024-01-22 ENCOUNTER — Ambulatory Visit (INDEPENDENT_AMBULATORY_CARE_PROVIDER_SITE_OTHER): Admitting: Physician Assistant

## 2024-01-22 ENCOUNTER — Institutional Professional Consult (permissible substitution) (INDEPENDENT_AMBULATORY_CARE_PROVIDER_SITE_OTHER): Admitting: Physician Assistant

## 2024-01-22 ENCOUNTER — Telehealth (INDEPENDENT_AMBULATORY_CARE_PROVIDER_SITE_OTHER): Payer: Self-pay | Admitting: Physician Assistant

## 2024-01-22 VITALS — BP 105/73 | HR 72

## 2024-01-22 DIAGNOSIS — H6990 Unspecified Eustachian tube disorder, unspecified ear: Secondary | ICD-10-CM

## 2024-01-22 DIAGNOSIS — H6993 Unspecified Eustachian tube disorder, bilateral: Secondary | ICD-10-CM

## 2024-01-22 MED ORDER — LORATADINE 10 MG PO TABS
10.0000 mg | ORAL_TABLET | Freq: Every day | ORAL | 11 refills | Status: DC
Start: 2024-01-22 — End: 2024-04-06

## 2024-01-22 NOTE — Progress Notes (Signed)
 Dear Dr. de Peru, Here is my assessment for our mutual patient, Reginald Campbell. Thank you for allowing me the opportunity to care for your patient. Please do not hesitate to contact me should you have any other questions. Sincerely, Chyrl Cohen PA-C  Otolaryngology Clinic Note Referring provider: Dr. de Peru HPI:  Reginald Campbell is a 48 y.o. male kindly referred by Dr. de Peru   The patient is a 48 year old gentleman seen in our office for evaluation of eustachian tube dysfunction.  The patient notes that approximate 1 month ago he developed symptoms of fullness in his bilateral ears right greater than left.  He notes clicking and popping again worse in the right when compared to the left.  He notes the clicking and popping is louder and more predominant when eating or yawning.  He notes that when his ears do pop he hears better out of them.  He denies any baseline hearing loss.  He denies any significant pain but notes some discomfort from time to time especially in the right ear.  He notes a history of seasonal allergies for which he is taking Claritin and was recently prescribed Flonase  but has not used.  He denies any current infection but does note that earlier in the year he did have a significant upper respiratory infection.  He denies any trauma to the ear, he does report history of tympanostomy tubes secondary to recurrent ear infections as a kid.  The is currently not working but did use work around Psychologist, counselling.  He was seen by his primary care provider who recommended antihistamine and Flonase , he has not picked up his Flonase  yet.  He was using Claritin.      Independent Review of Additional Tests or Records:  Primary care office visit note on 01/08/2024   PMH/Meds/All/SocHx/FamHx/ROS:   Past Medical History:  Diagnosis Date   Arthritis    Asthma    Back pain    BRBPR (bright red blood per rectum) 04/07/2015   Complication of circumcision 01/23/2017   Depression    Family history  of adverse reaction to anesthesia    daughter under anesthesia for 6 hours had problems with N/V    Family history of diabetes mellitus 04/07/2015   History of kidney stones    Hypertension    hx of elevated bp, no meds for htn   Lumbar radiculopathy, chronic 10/01/2016   Nephrolithiasis 04/18/2015   Phimosis    Rectal bleeding 03/07/2017   Sleep apnea    does not use cpap due to broke     Past Surgical History:  Procedure Laterality Date   CIRCUMCISION N/A 03/27/2017   Procedure: CIRCUMCISION ADULT with canal block;  Surgeon: Alvaro Hummer, MD;  Location: [facility];  Service: Urology;  Laterality: N/A;   CIRCUMCISION REVISION N/A 10/04/2017   Procedure: CIRCUMCISION REVISION;  Surgeon: Alvaro Hummer, MD;  Location: [facility];  Service: Urology;  Laterality: N/A;   COLONOSCOPY WITH PROPOFOL  N/A 02/23/2021   Procedure: COLONOSCOPY WITH PROPOFOL ;  Surgeon: San Sandor GAILS, DO;  Location: [facility];  Service: Gastroenterology;  Laterality: N/A;   NO PAST SURGERIES     POLYPECTOMY  02/23/2021   Procedure: POLYPECTOMY;  Surgeon: San Sandor GAILS, DO;  Location: [facility];  Service: Gastroenterology;;    Family History  Problem Relation Age of Onset   Heart disease Mother    Diabetes Father    Hypertension Father    Cirrhosis Father    Liver cancer Father  Heart disease Maternal Grandfather    Melanoma Paternal Grandmother    Lung cancer Paternal Uncle    Lung cancer Paternal Aunt    Asthma Daughter    Gallbladder disease Daughter    Erythema nodosum Daughter    Pancreatic cancer Neg Hx    Esophageal cancer Neg Hx      Social Connections: Socially Isolated (05/02/2023)   Social Connection and Isolation Panel    Frequency of Communication with Friends and Family: More than three times a week    Frequency of Social Gatherings with Friends and Family: Not on file    Attends Religious Services: Never    Database administrator or Organizations: No    Attends Banker  Meetings: Never    Marital Status: Widowed      Current Outpatient Medications:    ALPRAZolam  (XANAX ) 0.5 MG tablet, Take 1 tablet (0.5 mg total) by mouth 3 (three) times daily as needed for sleep or anxiety (for use during  the sleep test)., Disp: 5 tablet, Rfl: 0   cetirizine  (ZYRTEC ) 10 MG tablet, Take 1 tablet (10 mg total) by mouth daily., Disp: 30 tablet, Rfl: 2   fluticasone  (FLONASE ) 50 MCG/ACT nasal spray, Place 1 spray into both nostrils daily., Disp: 16 g, Rfl: 1   ondansetron  (ZOFRAN ) 4 MG tablet, Take 1 tablet (4 mg total) by mouth every 6 (six) hours., Disp: 12 tablet, Rfl: 0   tamsulosin  (FLOMAX ) 0.4 MG CAPS capsule, Take 1 capsule (0.4 mg total) by mouth daily., Disp: 5 capsule, Rfl: 0   Physical Exam:   BP 105/73   Pulse 72   SpO2 94%   Pertinent Findings  CN II-XII intact Bilateral EAC clear and TM intact with well pneumatized middle ear spaces Anterior rhinoscopy: Septum midline; bilateral inferior turbinates with moderate hypertrophy No lesions of oral cavity/oropharynx; dentition in normal limits No obviously palpable neck masses/lymphadenopathy/thyromegaly No respiratory distress or stridor  Seprately Identifiable Procedures:  None  Impression & Plans:  Reginald Campbell is a 48 y.o. male with the following   Eustachian tube dysfunction-   48 year old male seen in our office for evaluation of eustachian tube dysfunction.  I agree with his primary care provider's diagnosis the symptoms are most consistent with eustachian tube dysfunction.  I do think he would benefit from continued use of the antihistamine, using the Flonase  that was prescribed.  I would recommend audiological evaluation and follow-up in our office in 3 months for repeat evaluation, and like to see him sooner as needed.  I will call him with the audiological results as this will likely be done before I see him in clinic.  He was given strict return precautions.  He verbalized understanding and  agreement to today's plan had no further questions or concerns.   - f/u audiological evaluation in a 7-month office visit follow-up with myself.   Thank you for allowing me the opportunity to care for your patient. Please do not hesitate to contact me should you have any other questions.  Sincerely, Chyrl Cohen PA-C [facility] Phone: [phone] Fax: [phone]  01/22/2024, 3:08 PM

## 2024-01-22 NOTE — Telephone Encounter (Signed)
 LVM to move appointment - provider needs to r/s due to surgery

## 2024-01-27 ENCOUNTER — Other Ambulatory Visit (HOSPITAL_COMMUNITY): Payer: Self-pay | Admitting: Family Medicine

## 2024-01-27 ENCOUNTER — Other Ambulatory Visit (HOSPITAL_BASED_OUTPATIENT_CLINIC_OR_DEPARTMENT_OTHER): Payer: Medicaid Other

## 2024-01-27 ENCOUNTER — Other Ambulatory Visit (HOSPITAL_BASED_OUTPATIENT_CLINIC_OR_DEPARTMENT_OTHER): Payer: Self-pay | Admitting: *Deleted

## 2024-01-27 DIAGNOSIS — E038 Other specified hypothyroidism: Secondary | ICD-10-CM | POA: Diagnosis not present

## 2024-01-28 LAB — T3: T3, Total: 128 ng/dL (ref 71–180)

## 2024-01-28 LAB — TSH+FREE T4
Free T4: 0.82 ng/dL (ref 0.82–1.77)
TSH: 5.17 u[IU]/mL — ABNORMAL HIGH (ref 0.450–4.500)

## 2024-02-03 ENCOUNTER — Ambulatory Visit (HOSPITAL_BASED_OUTPATIENT_CLINIC_OR_DEPARTMENT_OTHER): Payer: Medicaid Other | Admitting: Family Medicine

## 2024-02-03 ENCOUNTER — Encounter (HOSPITAL_BASED_OUTPATIENT_CLINIC_OR_DEPARTMENT_OTHER): Payer: Self-pay | Admitting: Family Medicine

## 2024-02-03 VITALS — BP 117/81 | HR 60 | Ht 69.0 in | Wt 376.2 lb

## 2024-02-03 DIAGNOSIS — H6990 Unspecified Eustachian tube disorder, unspecified ear: Secondary | ICD-10-CM | POA: Diagnosis not present

## 2024-02-03 DIAGNOSIS — Z0000 Encounter for general adult medical examination without abnormal findings: Secondary | ICD-10-CM

## 2024-02-03 DIAGNOSIS — E038 Other specified hypothyroidism: Secondary | ICD-10-CM

## 2024-02-03 DIAGNOSIS — G4733 Obstructive sleep apnea (adult) (pediatric): Secondary | ICD-10-CM | POA: Diagnosis not present

## 2024-02-03 MED ORDER — ZEPBOUND 2.5 MG/0.5ML ~~LOC~~ SOAJ
2.5000 mg | SUBCUTANEOUS | 1 refills | Status: DC
Start: 2024-02-03 — End: 2024-02-06

## 2024-02-03 NOTE — Patient Instructions (Signed)
  Medication Instructions:  Your physician recommends that you continue on your current medications as directed. Please refer to the Current Medication list given to you today. --If you need a refill on any your medications before your next appointment, please call your pharmacy first. If no refills are authorized on file call the office.-- Lab Work: Your physician has recommended that you have lab work today: 1 week before next visit  If you have labs (blood work) drawn today and your tests are completely normal, you will receive your results via MyChart message OR a phone call from our staff.  Please ensure you check your voicemail in the event that you authorized detailed messages to be left on a delegated number. If you have any lab test that is abnormal or we need to change your treatment, we will call you to review the results.  Follow-Up: Your next appointment:   Your physician recommends that you schedule a follow-up appointment in: 6 month physical with Dr. de Peru  You will receive a text message or e-mail with a link to a survey about your care and experience with [place] today! We would greatly appreciate your feedback!   Thanks for letting us be apart of your health journey!!  Primary Care and Sports Medicine   Dr. Ceasar Mons Peru   We encourage you to activate your patient portal called "MyChart".  Sign up information is provided on this After Visit Summary.  MyChart is used to connect with patients for Virtual Visits (Telemedicine).  Patients are able to view lab/test results, encounter notes, upcoming appointments, etc.  Non-urgent messages can be sent to your provider as well. To learn more about what you can do with MyChart, please visit --  [email or web address].

## 2024-02-03 NOTE — Assessment & Plan Note (Signed)
 Recent thyroid  labs remain stable with TSH slightly above normal range, but with normal free T4 and total T3.  Given this, would be appropriate to continue with monitoring.  Will plan to recheck thyroid  labs with baseline labs to be completed before next physical

## 2024-02-03 NOTE — Progress Notes (Signed)
    Procedures performed today:    None.  Independent interpretation of notes and tests performed by another provider:   None.  Brief History, Exam, Impression, and Recommendations:    BP 117/81 (BP Location: Left Arm, Patient Position: Sitting, Cuff Size: Normal)   Pulse 60   Ht 5' 9 (1.753 m)   Wt (!) 376 lb 3.2 oz (170.6 kg)   SpO2 98%   BMI 55.56 kg/m   Subclinical hypothyroidism Assessment & Plan: Recent thyroid  labs remain stable with TSH slightly above normal range, but with normal free T4 and total T3.  Given this, would be appropriate to continue with monitoring.  Will plan to recheck thyroid  labs with baseline labs to be completed before next physical  Orders: -     TSH + free T4; Future -     T3; Future  Dysfunction of Eustachian tube, unspecified laterality Assessment & Plan: Patient still with symptoms, however did have evaluation with ENT.  Recommendation is to continue with current therapy with plan for audiology evaluation and he does have follow-up scheduled with ENT as well.   Severe obesity (BMI >= 40) (HCC) Assessment & Plan: Long discussion today reviewing weight loss medications including injectable options including GLP-1 receptor agonist, oral agents including Contrave, orlistat, phentermine.  We discussed potential risk, benefits, cost associated with these various medications as well as the possibility of insurance coverage or lack thereof.  We discussed typical dosing regimen for both injectable and oral medications, proper administration of each.  We discussed potential outcomes with each. After discussion of potential risks and adverse reactions with these medications and potential benefits of each, patient would like to proceed with injectable GLP-1 receptor agonist if possible.  Prescription sent to pharmacy on file, if medication is cost prohibitive for patient, he will let us  know and we can look to send an alternative option.  Will plan for close  follow-up to monitor response to whichever medication patient is able to initiate. Additional consideration is for referral to healthy weight and wellness clinic - patient interested, referral placed today  Orders: -     Zepbound ; Inject 2.5 mg into the skin once a week.  Dispense: 2 mL; Refill: 1 -     Amb Ref to Medical Weight Management  Obstructive sleep apnea Assessment & Plan: Diagnosed with sleep medicine specialist, was started with CPAP.  Orders: -     Zepbound ; Inject 2.5 mg into the skin once a week.  Dispense: 2 mL; Refill: 1  Wellness examination -     CBC with Differential/Platelet; Future -     Comprehensive metabolic panel with GFR; Future -     Hemoglobin A1c; Future -     Lipid panel; Future -     TSH + free T4; Future -     T3; Future  Return in about 6 months (around 08/05/2024) for CPE with fasting labs 1 week prior.   ___________________________________________ Diamonique Ruedas de Peru, MD, ABFM, CAQSM Primary Care and Sports Medicine [facility]

## 2024-02-03 NOTE — Assessment & Plan Note (Signed)
 Diagnosed with sleep medicine specialist, was started with CPAP.

## 2024-02-03 NOTE — Assessment & Plan Note (Signed)
 Patient still with symptoms, however did have evaluation with ENT.  Recommendation is to continue with current therapy with plan for audiology evaluation and he does have follow-up scheduled with ENT as well.

## 2024-02-03 NOTE — Assessment & Plan Note (Signed)
 Long discussion today reviewing weight loss medications including injectable options including GLP-1 receptor agonist, oral agents including Contrave, orlistat, phentermine.  We discussed potential risk, benefits, cost associated with these various medications as well as the possibility of insurance coverage or lack thereof.  We discussed typical dosing regimen for both injectable and oral medications, proper administration of each.  We discussed potential outcomes with each. After discussion of potential risks and adverse reactions with these medications and potential benefits of each, patient would like to proceed with injectable GLP-1 receptor agonist if possible.  Prescription sent to pharmacy on file, if medication is cost prohibitive for patient, he will let us  know and we can look to send an alternative option.  Will plan for close follow-up to monitor response to whichever medication patient is able to initiate. Additional consideration is for referral to healthy weight and wellness clinic - patient interested, referral placed today

## 2024-02-06 ENCOUNTER — Telehealth (HOSPITAL_BASED_OUTPATIENT_CLINIC_OR_DEPARTMENT_OTHER): Payer: Self-pay | Admitting: *Deleted

## 2024-02-06 MED ORDER — SEMAGLUTIDE-WEIGHT MANAGEMENT 0.25 MG/0.5ML ~~LOC~~ SOAJ: 0.2500 mg | SUBCUTANEOUS | 1 refills | Status: DC

## 2024-02-06 NOTE — Telephone Encounter (Signed)
 LVM for patient to call the office regarding zepbound . This has been changed to [email or web address]  per insurance denial this is what is covered.

## 2024-02-06 NOTE — Addendum Note (Signed)
 Addended by: DE PERU, Aquil Duhe J on: 02/06/2024 08:03 AM   Modules accepted: Orders, Level of Service

## 2024-02-28 ENCOUNTER — Ambulatory Visit (INDEPENDENT_AMBULATORY_CARE_PROVIDER_SITE_OTHER): Admitting: Audiology

## 2024-02-28 DIAGNOSIS — Z0110 Encounter for examination of ears and hearing without abnormal findings: Secondary | ICD-10-CM | POA: Diagnosis not present

## 2024-02-28 DIAGNOSIS — H93291 Other abnormal auditory perceptions, right ear: Secondary | ICD-10-CM

## 2024-02-28 NOTE — Progress Notes (Signed)
  [address] [place], [place] [address] [phone]  Audiological Evaluation    Name: Reginald Campbell     DOB:   08/02/1975      MRN:   [id number]                                                                                     Service Date: 02/28/2024     Accompanied by: unaccompanied   Patient comes today after Reyes Cohen, PA-C sent a referral for a hearing evaluation due to concerns with Eustachian tube dysfunction.   Symptoms Yes Details  Hearing loss  []    Tinnitus  []    Ear pain/ infections/pressure  [x]  Right worse than left - does not pop/muffled  Balance problems  []    Noise exposure history  []    Previous ear surgeries  [x]  PE tubes as a child  Family history of hearing loss  []    Amplification  []    Other  []      Otoscopy: Right ear: Clear external ear canal and notable landmarks visualized on the tympanic membrane. Left ear:  Clear external ear canal and notable landmarks visualized on the tympanic membrane.  Tympanometry: Right ear: Type A- Normal external ear canal volume with normal middle ear pressure and tympanic membrane compliance. Left ear: Type A- Normal external ear canal volume with normal middle ear pressure and tympanic membrane compliance.    Pure tone Audiometry: Both ears- Normal hearing from 250 Hz - 8000 Hz.  Of note, there was a  15dBHL air-bone gap at 250 Hz for both ears.  Speech Audiometry: Right ear- Speech Reception Threshold (SRT) was obtained at 15 dBHL. Left ear-Speech Reception Threshold (SRT) was obtained at 15 dBHL.   Word Recognition Score Tested using NU-6 (recorded) Right ear: 100% was obtained at a presentation level of 50 dBHL with contralateral masking which is deemed as  excellent. Left ear: 96% was obtained at a presentation level of 50 dBHL with contralateral masking which is deemed as  excellent.   The hearing test results were completed under headphones and results are deemed to be of good  reliability. Test technique:  conventional      Recommendations: Follow up with ENT as scheduled . Return for a hearing evaluation if concerns with hearing changes arise or per MD recommendation.   Erlean Mealor MARIE LEROUX-MARTINEZ, AUD

## 2024-03-06 ENCOUNTER — Encounter: Payer: Self-pay | Admitting: Audiology

## 2024-04-06 ENCOUNTER — Ambulatory Visit (INDEPENDENT_AMBULATORY_CARE_PROVIDER_SITE_OTHER): Admitting: Physician Assistant

## 2024-04-06 ENCOUNTER — Encounter (INDEPENDENT_AMBULATORY_CARE_PROVIDER_SITE_OTHER): Payer: Self-pay | Admitting: Physician Assistant

## 2024-04-06 VITALS — BP 120/75 | HR 94 | Temp 98.1°F | Ht 70.0 in | Wt 365.0 lb

## 2024-04-06 DIAGNOSIS — D122 Benign neoplasm of ascending colon: Secondary | ICD-10-CM

## 2024-04-06 DIAGNOSIS — M5442 Lumbago with sciatica, left side: Secondary | ICD-10-CM

## 2024-04-06 DIAGNOSIS — Z6841 Body Mass Index (BMI) 40.0 and over, adult: Secondary | ICD-10-CM | POA: Diagnosis not present

## 2024-04-06 DIAGNOSIS — M5441 Lumbago with sciatica, right side: Secondary | ICD-10-CM | POA: Diagnosis not present

## 2024-04-06 DIAGNOSIS — F331 Major depressive disorder, recurrent, moderate: Secondary | ICD-10-CM | POA: Diagnosis not present

## 2024-04-06 DIAGNOSIS — G4733 Obstructive sleep apnea (adult) (pediatric): Secondary | ICD-10-CM

## 2024-04-06 DIAGNOSIS — G8929 Other chronic pain: Secondary | ICD-10-CM

## 2024-04-06 DIAGNOSIS — E66813 Obesity, class 3: Secondary | ICD-10-CM | POA: Diagnosis not present

## 2024-04-06 DIAGNOSIS — E038 Other specified hypothyroidism: Secondary | ICD-10-CM | POA: Diagnosis not present

## 2024-04-06 DIAGNOSIS — E882 Lipomatosis, not elsewhere classified: Secondary | ICD-10-CM

## 2024-04-06 NOTE — Progress Notes (Signed)
 Office: [phone]  /  Fax: [phone]   Initial Visit    Reginald Campbell was seen in clinic today to evaluate for obesity. He is interested in losing weight to improve overall health and reduce the risk of weight related complications. He presents today to review program treatment options, initial physical assessment, and evaluation.     He was referred by: PCP  When asked what else they would like to accomplish? He states: Adopt a healthier eating pattern and lifestyle, Improve energy levels and physical activity, Improve existing medical conditions, Improve quality of life, Improve appearance, Improve self-confidence, and Lose 65-75 lbs  When asked how has your weight affected you? He states: Contributed to medical problems, Contributed to orthopedic problems or mobility issues, Problems with eating patterns, and Has affected mood   Weight history: Reginald Campbell is a 48 year old male who presents for initial evaluation for obesity treatment. He was referred by his primary care physician for evaluation and reports his daughter was previously at patient at [facility].   He has a history of being overweight since childhood, with a period of healthier weight during his teenage years due to regular physical activity and playing football. A back injury in his twenties led to decreased activity and subsequent weight gain.  His highest recorded weight was 382 pounds approximately one year ago. He has since lost weight and is currently at 360 pounds, having started Wegovy  a month ago and engaging in regular gym activities with his daughter. His goal is to lose 65 to 75 pounds, aiming to get below 300 pounds.  Wegovy  has helped control his cravings and overall appetite, allowing him to manage portion sizes better. He has been going to the gym five days a week, walking a mile in ten minutes on the treadmill and doing upper body weight training.  He has sleep apnea but is not using a CPAP machine.  He has arthritis in his back and a history of previous injuries. No high blood pressure, cholesterol issues, diabetes, heart disease, or kidney problems. He experiences some overeating, particularly of healthy foods, and sometimes skips lunch, having a large breakfast and then waiting until supper to eat again. No significant fatigue but acknowledges reduced physical activity due to his back injury.  No mental health issues but acknowledges that his weight has affected his mood and self-esteem at times. He has not attempted significant weight loss efforts in the past and is not currently following a specific diet plan, aside from the changes made with Wegovy  and increased physical activity.  He is not currently working due to his back injury and is trying to get disability. He has started going to the gym with his daughter.  Highest weight: 382 lbs  Some associated conditions: Arthritis:back and OSA  Contributing factors: family history of obesity, disruption of circadian rhythm / sleep disordered breathing, consumption of processed foods, moderate to high levels of stress, reduced physical activity, chronic skipping of meals, slow metabolism for age, enticing relationships and enviroment, and hectic pace of life  Weight promoting medications identified: None  Prior weight loss attempts: None  Current nutrition plan: None  Current level of physical activity: Limited due to chronic pain or orthopedic problems, Walking 10 minutes, five a week, and Strength training 10 minutes and 20 minutes, five  Current or previous pharmacotherapy: Is interested in pharmacotherapy and GLP-1  Response to medication: just starting wegovy  and has lost weight   Past medical history includes:  Past Medical History:  Diagnosis Date   Arthritis    Asthma    Back pain    BRBPR (bright red blood per rectum) 04/07/2015   Complication of circumcision 01/23/2017   Depression    Family history of adverse reaction  to anesthesia    daughter under anesthesia for 6 hours had problems with N/V    Family history of diabetes mellitus 04/07/2015   History of kidney stones    Hypertension    hx of elevated bp, no meds for htn   Lumbar radiculopathy, chronic 10/01/2016   Nephrolithiasis 04/18/2015   Phimosis    Rectal bleeding 03/07/2017   Sleep apnea    does not use cpap due to broke     Objective    BP 120/75   Pulse 94   Temp 98.1 F (36.7 C)   Ht 5' 10 (1.778 m)   Wt (!) 365 lb (165.6 kg)   SpO2 98%   BMI 52.37 kg/m  He was weighed on the bioimpedance scale: Body mass index is 52.37 kg/m.  Body Fat%:43.4%, Visceral Fat Rating:32, Weight trend over the last 12 months: Decreasing  General:  Alert, oriented and cooperative. Patient is in no acute distress.  Respiratory: Normal respiratory effort, no problems with respiration noted   Gait: able to ambulate independently  Mental Status: Normal mood and affect. Normal behavior. Normal judgment and thought content.   DIAGNOSTIC DATA REVIEWED:  BMET    Component Value Date/Time   NA 136 12/05/2023 2104   NA 142 07/29/2023 0833   K 3.8 12/05/2023 2104   CL 101 12/05/2023 2104   CO2 24 12/05/2023 2104   GLUCOSE 107 (H) 12/05/2023 2104   BUN 12 12/05/2023 2104   BUN 14 07/29/2023 0833   CREATININE 0.87 12/05/2023 2104   CALCIUM 8.5 (L) 12/05/2023 2104   GFRNONAA >60 12/05/2023 2104   GFRAA >60 03/20/2019 1935   Lab Results  Component Value Date   HGBA1C 5.6 07/29/2023   HGBA1C 5.3 04/07/2015   No results found for: INSULIN CBC    Component Value Date/Time   WBC 10.2 12/05/2023 2104   RBC 4.89 12/05/2023 2104   HGB 13.9 12/05/2023 2104   HGB 13.6 07/29/2023 0833   HCT 43.1 12/05/2023 2104   HCT 41.8 07/29/2023 0833   PLT 188 12/05/2023 2104   PLT 154 07/29/2023 0833   MCV 88.1 12/05/2023 2104   MCV 89 07/29/2023 0833   MCH 28.4 12/05/2023 2104   MCHC 32.3 12/05/2023 2104   RDW 14.3 12/05/2023 2104   RDW 13.7 07/29/2023 0833    Iron/TIBC/Ferritin/ %Sat No results found for: IRON, TIBC, FERRITIN, IRONPCTSAT Lipid Panel     Component Value Date/Time   CHOL 166 07/29/2023 0833   TRIG 212 (H) 07/29/2023 0833   HDL 39 (L) 07/29/2023 0833   CHOLHDL 4.3 07/29/2023 0833   LDLCALC 91 07/29/2023 0833   Hepatic Function Panel     Component Value Date/Time   PROT 7.8 10/05/2023 0736   PROT 6.7 07/29/2023 0833   ALBUMIN 4.0 10/05/2023 0736   ALBUMIN 4.0 (L) 07/29/2023 0833   AST 18 10/05/2023 0736   ALT 12 10/05/2023 0736   ALKPHOS 91 10/05/2023 0736   BILITOT 1.1 10/05/2023 0736   BILITOT 0.4 07/29/2023 0833      Component Value Date/Time   TSH 5.170 (H) 01/27/2024 1342     Assessment and Plan   Subclinical hypothyroidism  Obstructive sleep apnea  Chronic bilateral low back  pain with bilateral sciatica  Moderate episode of recurrent major depressive disorder (HCC)  Adenomatous polyp of ascending colon  Epidural lipomatosis  Class 3 severe obesity due to excess calories with serious comorbidity and body mass index (BMI) of 50.0 to 59.9 in adult  Current BMI 52.4  Assessment and Plan Assessment & Plan  Obesity Obesity with weight gain following a back injury. Current weight is 360 lbs, reduced from a peak of 382 lbs a year ago. Currently on Wegovy  for weight management, aiding in appetite control and cravings. Engaged in regular physical activity, including treadmill walking and upper body weight training. No significant comorbidities such as hypertension, diabetes, or heart disease, but has a family history of heart disease and cancer. Reports sleep apnea but not on CPAP. No significant mental health issues impacting weight management. Interested in losing 65-75 lbs to improve quality of life and reduce health risks. Potential benefits of weight loss include reduced risk of heart disease, type 2 diabetes, and certain cancers. - Continue Wegovy  for weight management. - Encourage  continuation of regular physical activity, including treadmill walking and weight training. - Schedule initial visit for metabolism testing and personalized nutrition plan development. - Provide new patient packet for completion before the first visit.  History of untreated OSA Intensive lifestyle modifications are the first line treatment for this issue. Educated pt that OSA is a cause of systemic hypertension and is associated with an increased incidence of stroke, heart failure, atrial fibrillation, and coronary heart disease.  Severe OSA increases all-cause mortality and cardiovascular mortality.  May want to consider repeating sleep study at some point  -Goal: Treatment of OSA via CPAP compliance and weight loss. Plasma ghrelin levels (appetite or "hunger hormone") are significantly higher in OSA patients than in BMI-matched controls, but decrease to levels similar to those of obese patients without OSA after CPAP treatment.  Weight loss improves OSA by several mechanisms, including reduction in fatty tissue in the throat (i.e. parapharyngeal fat) and the tongue. Loss of abdominal fat increases mediastinal traction on the upper airway making it less likely to collapse during sleep. Studies have also shown that compliance with CPAP treatment improves leptin imbalance.  Limited mobility due to chronic back pain  Patient has been working on increasing his activity level and we discussed the importance of exercise in addition to adequate nutrition in the overall program for healthy weight loss.  Encouraged patient to continue regular exercise to promote weight loss.    Subclinical hypothyroidism May need further work up/follow up and potentially treatment for hypothyroidism.      Obesity Treatment / Action Plan:  Patient will work on garnering support from family and friends to begin weight loss journey. Will work on eliminating or reducing the presence of highly palatable, calorie dense  foods in the home. Will complete provided nutritional and psychosocial assessment questionnaire before the next appointment. Will be scheduled for indirect calorimetry to determine resting energy expenditure in a fasting state.  This will allow us  to create a reduced calorie, high-protein meal plan to promote loss of fat mass while preserving muscle mass. Will think about ideas on how to incorporate physical activity into their daily routine. Will work on reducing intake of added sugars, simple sugars and processed carbs. Will reduce the frequency of eating out and making healthier choices by advanced menu planning. Will work on reading labels, making healthier choices and watching portion sizes. Counseled on the health benefits of losing 5%-15% of total body weight. Will  work on improving sleep hygiene and trying to obtain at least 7 hours of sleep. Was counseled on nutritional approaches to weight loss and benefits of reducing processed foods and consuming plant-based foods and high quality protein as part of nutritional weight management. Was counseled on pharmacotherapy and role as an adjunct in weight management.   Obesity Education Performed Today:  He was weighed on the bioimpedance scale and results were discussed and documented in the synopsis.  We discussed obesity as a disease and the importance of a more detailed evaluation of all the factors contributing to the disease.  We discussed the importance of long term lifestyle changes which include nutrition, exercise and behavioral modifications as well as the importance of customizing this to his specific health and social needs.  We discussed the benefits of reaching a healthier weight to alleviate the symptoms of existing conditions and reduce the risks of the biomechanical, metabolic and psychological effects of obesity.  We reviewed the four pillars of obesity medicine and importance of using a multimodal approach.  We reviewed  the basic principles in weight management.   Reginald Campbell appears to be in the action stage of change and states they are ready to start intensive lifestyle modifications and behavioral modifications.  I have spent 32 minutes in the care of the patient today including: 2 minutes before the visit reviewing and preparing the chart. 26 minutes face-to-face assessing and reviewing listed medical problems as outlined in obesity care plan, providing nutritional and behavioral counseling on topics outlined in the obesity care plan, independently interpreting test results and goals of care, as described in assessment and plan, reviewing and discussing biometric information and progress, and reviewing latest PCP notes and specialist consultations 4 minutes after the visit updating chart and documentation of encounter.  Reviewed by clinician on day of visit: allergies, medications, problem list, medical history, surgical history, family history, social history, and previous encounter notes pertinent to obesity diagnosis.   Sharyon Peitz,PA-C

## 2024-04-10 ENCOUNTER — Telehealth: Payer: Self-pay

## 2024-04-10 DIAGNOSIS — H6990 Unspecified Eustachian tube disorder, unspecified ear: Secondary | ICD-10-CM

## 2024-04-10 NOTE — Progress Notes (Signed)
 Complex Care Management Note Care Guide Note  04/10/2024 Name: Reginald Campbell MRN: [id number] DOB: 08-07-1975   Complex Care Management Outreach Attempts: An unsuccessful telephone outreach was attempted today to offer the patient information about available complex care management services.  Follow Up Plan:  Additional outreach attempts will be made to offer the patient complex care management information and services.   Encounter Outcome:  No Answer  Dreama [facility]  [facility], [facility] VBCI Assistant Direct Dial: [phone]  Fax: [phone]

## 2024-04-14 NOTE — Progress Notes (Unsigned)
 Complex Care Management Note Care Guide Note  04/14/2024 Name: Reginald Campbell MRN: [id number] DOB: [birth year]   Complex Care Management Outreach Attempts: A second unsuccessful outreach was attempted today to offer the patient with information about available complex care management services.  Follow Up Plan:  Additional outreach attempts will be made to offer the patient complex care management information and services.   Encounter Outcome:  No Answer  Dreama [facility]wood [facility]  [facility], [facility] VBCI Assistant Direct Dial: [phone]  Fax: [phone]

## 2024-04-17 NOTE — Progress Notes (Signed)
 Complex Care Management Note Care Guide Note  04/17/2024 Name: Reginald Campbell MRN: [id number] DOB: 11-Jul-1976   Complex Care Management Outreach Attempts: A third unsuccessful outreach was attempted today to offer the patient with information about available complex care management services.  Follow Up Plan:  No further outreach attempts will be made at this time. We have been unable to contact the patient to offer or enroll patient in complex care management services.  Encounter Outcome:  No Answer  Dreama [facility]  [facility], [facility] VBCI Assistant Direct Dial: [phone]  Fax: [phone]

## 2024-04-22 NOTE — Progress Notes (Signed)
 Complex Care Management Note  Care Guide Note 04/22/2024 Name: Reginald Campbell MRN: [id number] DOB: 12-04-1975  Reginald Campbell is a 48 y.o. year old male who sees de Peru, Quintin PARAS, MD for primary care. I reached out to Reginald Campbell by phone today to offer complex care management services.  [name] was given information about Complex Care Management services today including:   The Complex Care Management services include support from the care team which includes your Nurse Care Manager, Clinical Social Worker, or Pharmacist.  The Complex Care Management team is here to help remove barriers to the health concerns and goals most important to you. Complex Care Management services are voluntary, and the patient may decline or stop services at any time by request to their care team member.   Complex Care Management Consent Status: Patient did not agree to participate in complex care management services at this time.  Follow up plan:  Patient will follow up with PCP.  Encounter Outcome:  Patient Refused  Dreama Reginald [facility], [facility] VBCI Assistant Direct Dial: [phone]  Fax: [phone]

## 2024-04-27 ENCOUNTER — Ambulatory Visit (INDEPENDENT_AMBULATORY_CARE_PROVIDER_SITE_OTHER): Admitting: Physician Assistant

## 2024-05-05 ENCOUNTER — Encounter (INDEPENDENT_AMBULATORY_CARE_PROVIDER_SITE_OTHER): Payer: Self-pay | Admitting: Physician Assistant

## 2024-05-05 ENCOUNTER — Ambulatory Visit (INDEPENDENT_AMBULATORY_CARE_PROVIDER_SITE_OTHER): Admitting: Physician Assistant

## 2024-05-05 VITALS — BP 137/85 | HR 70 | Temp 98.9°F | Ht 70.0 in | Wt 350.0 lb

## 2024-05-05 DIAGNOSIS — Z09 Encounter for follow-up examination after completed treatment for conditions other than malignant neoplasm: Secondary | ICD-10-CM | POA: Diagnosis not present

## 2024-05-05 DIAGNOSIS — Z8669 Personal history of other diseases of the nervous system and sense organs: Secondary | ICD-10-CM

## 2024-05-05 DIAGNOSIS — H6993 Unspecified Eustachian tube disorder, bilateral: Secondary | ICD-10-CM

## 2024-05-05 NOTE — Progress Notes (Signed)
 Dear Dr. de Peru, Here is my assessment for our mutual patient, Reginald Campbell. Thank you for allowing me the opportunity to care for your patient. Please do not hesitate to contact me should you have any other questions. Sincerely, Chyrl Cohen PA-C  Otolaryngology Clinic Note Referring provider: Dr. de Peru HPI:  Reginald Campbell is a 48 y.o. male kindly referred by Dr. de Peru   The patient is a 48 year old gentleman seen in our office for follow-up evaluation of eustachian tube dysfunction.  He was last seen in the office on 01/22/2024.  Below is recap on the counter.  The patient is a 48 year old gentleman seen in our office for evaluation of eustachian tube dysfunction.  The patient notes that approximate 1 month ago he developed symptoms of fullness in his bilateral ears right greater than left.  He notes clicking and popping again worse in the right when compared to the left.  He notes the clicking and popping is louder and more predominant when eating or yawning.  He notes that when his ears do pop he hears better out of them.  He denies any baseline hearing loss.  He denies any significant pain but notes some discomfort from time to time especially in the right ear.  He notes a history of seasonal allergies for which he is taking Claritin  and was recently prescribed Flonase  but has not used.  He denies any current infection but does note that earlier in the year he did have a significant upper respiratory infection.  He denies any trauma to the ear, he does report history of tympanostomy tubes secondary to recurrent ear infections as a kid.  The is currently not working but did use work around Psychologist, counselling.  He was seen by his primary care provider who recommended antihistamine and Flonase , he has not picked up his Flonase  yet.  He was using Claritin .    Update 05/05/2024.  Since last office visit he notes that he did use daily antihistamine, he did not use the Flonase .  He notes his symptoms have  completely resolved.  He denies any clicking, popping, pressure, no ringing in the ears, no decreased hearing.  No concerns at today's visit.     Independent Review of Additional Tests or Records:  Audiological evaluation on 02/28/2024   Otoscopy: Right ear: Clear external ear canal and notable landmarks visualized on the tympanic membrane. Left ear:  Clear external ear canal and notable landmarks visualized on the tympanic membrane.   Tympanometry: Right ear: Type A- Normal external ear canal volume with normal middle ear pressure and tympanic membrane compliance. Left ear: Type A- Normal external ear canal volume with normal middle ear pressure and tympanic membrane compliance.     Pure tone Audiometry: Both ears- Normal hearing from 250 Hz - 8000 Hz.  Of note, there was a  15dBHL air-bone gap at 250 Hz for both ears.   Speech Audiometry: Right ear- Speech Reception Threshold (SRT) was obtained at 15 dBHL. Left ear-Speech Reception Threshold (SRT) was obtained at 15 dBHL.   Word Recognition Score Tested using NU-6 (recorded) Right ear: 100% was obtained at a presentation level of 50 dBHL with contralateral masking which is deemed as  excellent. Left ear: 96% was obtained at a presentation level of 50 dBHL with contralateral masking which is deemed as  excellent.   The hearing test results were completed under headphones and results are deemed to be of good reliability. Test technique:  conventional  Recommendations: Follow up with ENT as scheduled . Return for a hearing evaluation if concerns with hearing changes arise or per MD recommendation.    PMH/Meds/All/SocHx/FamHx/ROS:   Past Medical History:  Diagnosis Date   Arthritis    Asthma    Back pain    BRBPR (bright red blood per rectum) 04/07/2015   Complication of circumcision 01/23/2017   Depression    Family history of adverse reaction to anesthesia    daughter under anesthesia for 6 hours had problems with  N/V    Family history of diabetes mellitus 04/07/2015   History of kidney stones    Hypertension    hx of elevated bp, no meds for htn   Lumbar radiculopathy, chronic 10/01/2016   Nephrolithiasis 04/18/2015   Phimosis    Rectal bleeding 03/07/2017   Sleep apnea    does not use cpap due to broke     Past Surgical History:  Procedure Laterality Date   CIRCUMCISION N/A 03/27/2017   Procedure: CIRCUMCISION ADULT with canal block;  Surgeon: Alvaro Hummer, MD;  Location: [facility];  Service: Urology;  Laterality: N/A;   CIRCUMCISION REVISION N/A 10/04/2017   Procedure: CIRCUMCISION REVISION;  Surgeon: Alvaro Hummer, MD;  Location: [facility];  Service: Urology;  Laterality: N/A;   COLONOSCOPY WITH PROPOFOL  N/A 02/23/2021   Procedure: COLONOSCOPY WITH PROPOFOL ;  Surgeon: San Sandor GAILS, DO;  Location: [facility];  Service: Gastroenterology;  Laterality: N/A;   NO PAST SURGERIES     POLYPECTOMY  02/23/2021   Procedure: POLYPECTOMY;  Surgeon: San Sandor GAILS, DO;  Location: [facility];  Service: Gastroenterology;;    Family History  Problem Relation Age of Onset   Heart disease Mother    Diabetes Father    Hypertension Father    Cirrhosis Father    Liver cancer Father    Heart disease Maternal Grandfather    Melanoma Paternal Grandmother    Lung cancer Paternal Uncle    Lung cancer Paternal Aunt    Asthma Daughter    Gallbladder disease Daughter    Erythema nodosum Daughter    Pancreatic cancer Neg Hx    Esophageal cancer Neg Hx      Social Connections: Socially Isolated (05/02/2023)   Social Connection and Isolation Panel    Frequency of Communication with Friends and Family: More than three times a week    Frequency of Social Gatherings with Friends and Family: Not on file    Attends Religious Services: Never    Active Member of Clubs or Organizations: No    Attends Banker Meetings: Never    Marital Status: Widowed      Current Outpatient Medications:     Semaglutide -Weight Management 0.25 MG/0.5ML SOAJ, Inject 0.25 mg into the skin once a week. (Patient not taking: Reported on 05/05/2024), Disp: 2 mL, Rfl: 1   Physical Exam:   BP 137/85 (BP Location: Right Arm, Patient Position: Sitting)   Pulse 70   Temp 98.9 F (37.2 C)   Ht 5' 10 (1.778 m)   Wt (!) 350 lb (158.8 kg)   SpO2 96%   BMI 50.22 kg/m   Pertinent Findings  CN II-XII intact Bilateral EAC with minimal cerumen and TM intact with well pneumatized middle ear spaces No obvious neck masses/lymphadenopathy/thyromegaly No respiratory distress or stridor  Seprately Identifiable Procedures:  None  Impression & Plans:  Deago Burruss is a 48 y.o. male with the following   Eustachian tube dysfunction-  Symptoms have completely resolved, no  concerns at today's visit.  Follow-up PRN   - f/u PRN   Thank you for allowing me the opportunity to care for your patient. Please do not hesitate to contact me should you have any other questions.  Sincerely, Chyrl Cohen PA-C [facility] Phone: [phone] Fax: [phone]  05/05/2024, 2:05 PM

## 2024-05-18 DIAGNOSIS — D1779 Benign lipomatous neoplasm of other sites: Secondary | ICD-10-CM | POA: Diagnosis not present

## 2024-05-18 DIAGNOSIS — M5440 Lumbago with sciatica, unspecified side: Secondary | ICD-10-CM | POA: Diagnosis not present

## 2024-05-21 ENCOUNTER — Other Ambulatory Visit: Payer: Self-pay | Admitting: Neurosurgery

## 2024-05-21 DIAGNOSIS — E882 Lipomatosis, not elsewhere classified: Secondary | ICD-10-CM

## 2024-05-27 ENCOUNTER — Ambulatory Visit (HOSPITAL_COMMUNITY)
Admission: RE | Admit: 2024-05-27 | Discharge: 2024-05-27 | Disposition: A | Discharge status: Home / Self Care | Source: Ambulatory Visit | Attending: Neurosurgery | Admitting: Neurosurgery

## 2024-05-27 DIAGNOSIS — M5126 Other intervertebral disc displacement, lumbar region: Secondary | ICD-10-CM | POA: Diagnosis not present

## 2024-05-27 DIAGNOSIS — M48061 Spinal stenosis, lumbar region without neurogenic claudication: Secondary | ICD-10-CM | POA: Diagnosis not present

## 2024-05-27 DIAGNOSIS — E882 Lipomatosis, not elsewhere classified: Secondary | ICD-10-CM | POA: Insufficient documentation

## 2024-05-27 DIAGNOSIS — M47816 Spondylosis without myelopathy or radiculopathy, lumbar region: Secondary | ICD-10-CM | POA: Diagnosis not present

## 2024-05-27 DIAGNOSIS — M5440 Lumbago with sciatica, unspecified side: Secondary | ICD-10-CM | POA: Diagnosis not present

## 2024-07-03 ENCOUNTER — Ambulatory Visit: Payer: Self-pay

## 2024-07-03 NOTE — Telephone Encounter (Signed)
 FYI Only or Action Required?: FYI only for provider: appointment scheduled on 07/06/2024.  Patient was last seen in primary care on 04/06/2024 by Rayburn, Elouise Phlegm, PA-C.  Called Nurse Triage reporting Shoulder Pain.  Symptoms began several days ago.  Interventions attempted: OTC medications: advil .  Symptoms are: unchanged.  Triage Disposition: See PCP When Office is Open (Within 3 Days)  Patient/caregiver understands and will follow disposition?: Yes  Copied from CRM #[id number]. Topic: Clinical - Red Word Triage >> Jul 03, 2024 12:54 PM Victoria B wrote: [facility] that prompted transfer to Nurse Triage: Patient has severe pain in shoulder Reason for Disposition  [1] MODERATE pain (e.g., interferes with normal activities) AND [2] present > 3 days  Answer Assessment - Initial Assessment Questions 1. ONSET: When did the pain start?     Two days ago 2. LOCATION: Where is the pain located?     Left shoulder pain 3. PAIN: How bad is the pain? (Scale 1-10; or mild, moderate, severe)     9/10 with activity, no pain with rest 4. WORK OR EXERCISE: Has there been any recent work or exercise that involved this part of the body?     denies 5. CAUSE: What do you think is causing the shoulder pain?     unknown 6. OTHER SYMPTOMS: Do you have any other symptoms? (e.g., neck pain, swelling, rash, fever, numbness, weakness)     Decreased ROM, right sided neck pain, weakness caused by pain  Protocols used: Shoulder Pain-A-AH

## 2024-07-06 ENCOUNTER — Ambulatory Visit (INDEPENDENT_AMBULATORY_CARE_PROVIDER_SITE_OTHER)

## 2024-07-06 ENCOUNTER — Ambulatory Visit (HOSPITAL_BASED_OUTPATIENT_CLINIC_OR_DEPARTMENT_OTHER): Admitting: Family Medicine

## 2024-07-06 ENCOUNTER — Encounter (HOSPITAL_BASED_OUTPATIENT_CLINIC_OR_DEPARTMENT_OTHER): Payer: Self-pay | Admitting: Family Medicine

## 2024-07-06 VITALS — BP 122/84 | HR 64 | Ht 70.0 in | Wt 372.2 lb

## 2024-07-06 DIAGNOSIS — M25512 Pain in left shoulder: Secondary | ICD-10-CM | POA: Diagnosis not present

## 2024-07-06 DIAGNOSIS — R29898 Other symptoms and signs involving the musculoskeletal system: Secondary | ICD-10-CM | POA: Diagnosis not present

## 2024-07-06 DIAGNOSIS — M5412 Radiculopathy, cervical region: Secondary | ICD-10-CM | POA: Diagnosis not present

## 2024-07-06 MED ORDER — CYCLOBENZAPRINE HCL 5 MG PO TABS: 5.0000 mg | ORAL_TABLET | Freq: Every evening | ORAL | 0 refills | Status: DC | PRN

## 2024-07-06 MED ORDER — MELOXICAM 15 MG PO TABS: 15.0000 mg | ORAL_TABLET | Freq: Every day | ORAL | 0 refills | Status: AC

## 2024-07-06 NOTE — Progress Notes (Signed)
 Acute Care Office Visit  Subjective:   Reginald Campbell 08-Aug-1975 07/06/2024  Chief Complaint  Patient presents with   Shoulder Pain    Patient has been having pain in his left shoulder for about 1 week.    HPI:  Patient states he is having left sided shoulder pain near tricep. He states this began approx. 2 weeks ago upon waking up one morning. He denies recent falls, injuries or accidents. He reports hx of humeral fracture when he was 48 yo. He states there is a pulling sensation with mild numbness and tingling to left fingers. Reports symptoms are intermittent. Denies chest pain, SHOB, diaphoresis. He does report mild cervical pain ongoing for the past 1-2 weeks that began at the same time of left arm pain upon waking up. Reports pain worsens with lifting and pulling. Reports episode of weakness to left hand that occurred in the past 2 weeks. He has been using heat pad and stretching with mild improvement of pain.    The following portions of the patient's history were reviewed and updated as appropriate: past medical history, past surgical history, family history, social history, allergies, medications, and problem list.   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   At risk for obstructive sleep apnea 09/03/2023   Class 3 severe obesity due to excess calories with body mass index (BMI) of 50.0 to 59.9 in adult [facility]) 09/03/2023   History of sleep apnea 09/03/2023   Epidural lipomatosis 08/05/2023   Wellness examination 08/05/2023   Subclinical hypothyroidism 08/05/2023   Lateral epicondylitis of right elbow 07/26/2022   Adenomatous polyp of ascending colon    Polyp of sigmoid colon    Grade III internal hemorrhoids    Screening for colorectal cancer 08/31/2020   Achilles tendonitis, bilateral 05/20/2020   Atypical mole 03/12/2019   HTN (hypertension) 10/22/2018   Chronic bilateral  low back pain with bilateral sciatica 04/09/2018   Internal hemorrhoids 03/08/2017   Severe obesity (BMI >= 40) (HCC) 10/02/2016   MDD (major depressive disorder) 10/01/2016   Bilateral shoulder pain 08/18/2015   Healthcare maintenance 04/07/2015   Obstructive sleep apnea 04/07/2015   Past Medical History:  Diagnosis Date   Arthritis    Asthma    Back pain    BRBPR (bright red blood per rectum) 04/07/2015   Complication of circumcision 01/23/2017   Depression    Family history of adverse reaction to anesthesia    daughter under anesthesia for 6 hours had problems with N/V    Family history of diabetes mellitus 04/07/2015   History of kidney stones    Hypertension    hx of elevated bp, no meds for htn   Lumbar radiculopathy, chronic 10/01/2016   Nephrolithiasis 04/18/2015   Phimosis    Rectal bleeding 03/07/2017   Sleep apnea    does not use cpap due to broke   Past Surgical History:  Procedure Laterality Date   CIRCUMCISION N/A 03/27/2017   Procedure: CIRCUMCISION ADULT with canal block;  Surgeon: Alvaro Hummer, MD;  Location: [facility];  Service: Urology;  Laterality: N/A;   CIRCUMCISION REVISION N/A 10/04/2017   Procedure: CIRCUMCISION REVISION;  Surgeon: Alvaro Hummer, MD;  Location: [facility];  Service: Urology;  Laterality: N/A;   COLONOSCOPY WITH PROPOFOL  N/A 02/23/2021   Procedure: COLONOSCOPY WITH PROPOFOL ;  Surgeon: San Sandor GAILS, DO;  Location[facility];  Service: Gastroenterology;  Laterality: N/A;   NO PAST SURGERIES     POLYPECTOMY  02/23/2021   Procedure: POLYPECTOMY;  Surgeon: San Sandor GAILS, DO;  Location: [facility];  Service: Gastroenterology;;   Family History  Problem Relation Age of Onset   Heart disease Mother    Diabetes Father    Hypertension Father    Cirrhosis Father    Liver cancer Father    Heart disease Maternal Grandfather    Melanoma Paternal Grandmother    Lung cancer Paternal Uncle    Lung cancer Paternal Aunt    Asthma Daughter     Gallbladder disease Daughter    Erythema nodosum Daughter    Pancreatic cancer Neg Hx    Esophageal cancer Neg Hx    Outpatient Medications Prior to Visit  Medication Sig Dispense Refill   Semaglutide -Weight Management 0.25 MG/0.5ML SOAJ Inject 0.25 mg into the skin once a week. (Patient not taking: Reported on 05/05/2024) 2 mL 1   No facility-administered medications prior to visit.   Allergies  Allergen Reactions   Penicillins Anaphylaxis, Hives, Swelling and Other (See Comments)    Pt was 15 Has patient had a PCN reaction causing immediate rash, facial/tongue/throat swelling, SOB or lightheadedness with hypotension: Yes Has patient had a PCN reaction causing severe rash involving mucus membranes or skin necrosis: No Has patient had a PCN reaction that required hospitalization: Yes Has patient had a PCN reaction occurring within the last 10 years: No If all of the above answers are NO, then may proceed with Cephalosporin use.      ROS: A complete ROS was performed with pertinent positives/negatives noted in the HPI. The remainder of the ROS are negative.    Objective:   Today's Vitals   07/06/24 1015  BP: 122/84  Pulse: 64  SpO2: 95%  Weight: (!) 372 lb 3.2 oz (168.8 kg)  Height: 5' 10 (1.778 m)    GENERAL: Well-appearing, in NAD. Well nourished.  SKIN: Pink, warm and dry.  Head: Normocephalic. NECK: Trachea midline. Full ROM w/o pain or tenderness.  RESPIRATORY: Chest wall symmetrical. Respirations even and non-labored. Breath sounds clear to auscultation bilaterally.  CARDIAC: S1, S2 present, regular rate and rhythm without murmur or gallops. Peripheral pulses 2+ bilaterally.  MSK: Muscle tone and strength appropriate for age. Joints w/o tenderness, redness, or swelling. Positive o'briens' test to LUE. Decreased ROM with internal/external rotation and abduction to LUE. No deformity.  EXTREMITIES: Without clubbing, cyanosis, or edema.  NEUROLOGIC: No motor or sensory  deficits. Steady, even gait. C2-C12 intact.  PSYCH/MENTAL STATUS: Alert, oriented x 3. Cooperative, appropriate mood and affect.    No results found for any visits on 07/06/24.    Assessment & Plan:  1. Acute pain of left shoulder (Primary) Concern for possible adheseive capsulitis versus labral involvement given new onset of decreased ROM and pain. Will refer to Ortho for further evaluation. Recommedn startin Mobic  15mg  daily with food and use ice/heat with mild stretching.  - meloxicam  (MOBIC ) 15 MG tablet; Take 1 tablet (15 mg total) by mouth daily.  Dispense: 15 tablet; Refill: 0 - Ambulatory referral to Orthopedic Surgery  2. Cervical radiculopathy Will obtain xray cervical spine given significant lumbar condition. Concern for possible nerve entrapment and recommend starting Mobic  and use of Flexeril  PRN with safe use. Use ice, heat and stretching. Will follow up with Ortho if needed.  - DG Cervical Spine Complete; Future - meloxicam  (MOBIC ) 15 MG tablet; Take 1 tablet (15 mg total) by mouth daily.  Dispense: 15 tablet; Refill: 0   Meds ordered this encounter  Medications   meloxicam  (MOBIC ) 15 MG tablet    Sig: Take 1 tablet (15 mg total) by mouth daily.    Dispense:  15 tablet    Refill:  0    Supervising Provider:   DE CUBA, RAYMOND J [[id number]]   cyclobenzaprine  (FLEXERIL ) 5 MG tablet    Sig: Take 1 tablet (5 mg total) by mouth at bedtime as needed for muscle spasms.    Dispense:  15 tablet    Refill:  0    Supervising Provider:   DE CUBA, RAYMOND J [[id number]]   Lab Orders  No laboratory test(s) ordered today   Return if symptoms worsen or fail to improve.    Patient to reach out to office if new, worrisome, or unresolved symptoms arise or if no improvement in patient's condition. Patient verbalized understanding and is agreeable to treatment plan. All questions answered to patient's satisfaction.    Reginald Campbell, [place]

## 2024-07-07 ENCOUNTER — Ambulatory Visit (HOSPITAL_BASED_OUTPATIENT_CLINIC_OR_DEPARTMENT_OTHER): Payer: Self-pay | Admitting: Family Medicine

## 2024-07-07 DIAGNOSIS — Z6841 Body Mass Index (BMI) 40.0 and over, adult: Secondary | ICD-10-CM | POA: Diagnosis not present

## 2024-07-07 DIAGNOSIS — M5440 Lumbago with sciatica, unspecified side: Secondary | ICD-10-CM | POA: Diagnosis not present

## 2024-07-07 DIAGNOSIS — D1779 Benign lipomatous neoplasm of other sites: Secondary | ICD-10-CM | POA: Diagnosis not present

## 2024-07-07 NOTE — Progress Notes (Signed)
 Hi Reginald Campbell,  Your xray did show degenerative changes present to your cervical spine. This could be contributing ot chronic pain and current symptoms. Evaluation by orthopedics may be beneficial for further treatment and imaging if symptoms do not improve.

## 2024-07-08 ENCOUNTER — Ambulatory Visit (INDEPENDENT_AMBULATORY_CARE_PROVIDER_SITE_OTHER): Admitting: Student

## 2024-07-08 DIAGNOSIS — M5412 Radiculopathy, cervical region: Secondary | ICD-10-CM

## 2024-07-08 DIAGNOSIS — M25512 Pain in left shoulder: Secondary | ICD-10-CM
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
## 2024-07-31 ENCOUNTER — Other Ambulatory Visit (HOSPITAL_COMMUNITY): Payer: Self-pay

## 2024-08-05 ENCOUNTER — Telehealth: Payer: Self-pay | Admitting: Family Medicine

## 2024-08-05 ENCOUNTER — Encounter (HOSPITAL_BASED_OUTPATIENT_CLINIC_OR_DEPARTMENT_OTHER): Payer: Self-pay | Admitting: Family Medicine

## 2024-08-05 ENCOUNTER — Ambulatory Visit (INDEPENDENT_AMBULATORY_CARE_PROVIDER_SITE_OTHER): Admitting: Family Medicine

## 2024-08-05 VITALS — BP 126/83 | HR 65 | Temp 98.2°F | Resp 18 | Ht 70.0 in | Wt 374.0 lb

## 2024-08-05 DIAGNOSIS — Z0000 Encounter for general adult medical examination without abnormal findings: Secondary | ICD-10-CM | POA: Diagnosis not present

## 2024-08-05 DIAGNOSIS — G4733 Obstructive sleep apnea (adult) (pediatric): Secondary | ICD-10-CM

## 2024-08-05 MED ORDER — WEGOVY 0.25 MG/0.5ML ~~LOC~~ SOAJ: 0.2500 mg | SUBCUTANEOUS | 1 refills | Status: DC

## 2024-08-05 MED ORDER — CYCLOBENZAPRINE HCL 5 MG PO TABS: 5.0000 mg | ORAL_TABLET | Freq: Every evening | ORAL | 0 refills | Status: AC | PRN

## 2024-08-05 MED ORDER — ZEPBOUND 2.5 MG/0.5ML ~~LOC~~ SOAJ: 2.5000 mg | SUBCUTANEOUS | 1 refills | Status: DC

## 2024-08-05 NOTE — Telephone Encounter (Signed)
 Fax received from pt's pharmacy that pt's insurance will not cover zepbound . Preferred alternative is [facility] .  Routing to Dr. [place] for review.

## 2024-08-05 NOTE — Addendum Note (Signed)
 Addended by: DE CUBA, RJ J on: 08/05/2024 02:15 PM   Modules accepted: Orders

## 2024-08-05 NOTE — Assessment & Plan Note (Signed)
 Long discussion today reviewing weight loss medications including injectable options including GLP-1 receptor agonist, oral agents including Contrave, orlistat, phentermine.  We discussed potential risk, benefits, cost associated with these various medications as well as the possibility of insurance coverage or lack thereof.  We discussed typical dosing regimen for both injectable and oral medications, proper administration of each.  We discussed potential outcomes with each. After discussion of potential risks and adverse reactions with these medications and potential benefits of each, patient would like to proceed with injectable GLP-1 receptor agonist if possible.  Prescription sent to pharmacy on file, if medication is cost prohibitive for patient, he will let us  know and we can look to send an alternative option.  Will plan for close follow-up to monitor response to whichever medication patient is able to initiate.

## 2024-08-05 NOTE — Progress Notes (Signed)
 " Subjective:    CC: Annual Physical Exam  HPI:  Reginald Campbell is a 49 y.o. presenting for annual physical  I reviewed the past medical history, family history, social history, surgical history, and allergies today and no changes were needed.  Please see the problem list section below in epic for further details.  Past Medical History: Past Medical History:  Diagnosis Date   Arthritis    Asthma    Back pain    BRBPR (bright red blood per rectum) 04/07/2015   Complication of circumcision 01/23/2017   Depression    Family history of adverse reaction to anesthesia    daughter under anesthesia for 6 hours had problems with N/V    Family history of diabetes mellitus 04/07/2015   History of kidney stones    Hypertension    hx of elevated bp, no meds for htn   Lumbar radiculopathy, chronic 10/01/2016   Nephrolithiasis 04/18/2015   Phimosis    Rectal bleeding 03/07/2017   Sleep apnea    does not use cpap due to broke   Past Surgical History: Past Surgical History:  Procedure Laterality Date   CIRCUMCISION N/A 03/27/2017   Procedure: CIRCUMCISION ADULT with canal block;  Surgeon: Alvaro Hummer, MD;  Location: [facility];  Service: Urology;  Laterality: N/A;   CIRCUMCISION REVISION N/A 10/04/2017   Procedure: CIRCUMCISION REVISION;  Surgeon: Alvaro Hummer, MD;  Location: [facility];  Service: Urology;  Laterality: N/A;   COLONOSCOPY WITH PROPOFOL  N/A 02/23/2021   Procedure: COLONOSCOPY WITH PROPOFOL ;  Surgeon: San Sandor GAILS, DO;  Location: [facility];  Service: Gastroenterology;  Laterality: N/A;   NO PAST SURGERIES     POLYPECTOMY  02/23/2021   Procedure: POLYPECTOMY;  Surgeon: San Sandor GAILS, DO;  Location: [facility];  Service: Gastroenterology;;   Social History: Social History   Socioeconomic History   Marital status: Widowed    Spouse name: Not on file   Number of children: 1   Years of education: Not on file   Highest education level: Not on file  Occupational History    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    Social Drivers of Health   Tobacco Use: Medium Risk (08/05/2024)   Patient History    Smoking Tobacco Use: Former    Smokeless Tobacco Use: Never    Passive Exposure: Not on file  Financial Resource Strain: Low Risk (07/06/2024)   Overall Financial Resource Strain (CARDIA)    Difficulty of Paying Living Expenses: Not hard at all  Food Insecurity: No Food Insecurity (07/06/2024)   Epic    Worried About Radiation Protection Practitioner of Food in the Last Year: Never true    Ran Out of Food in the Last Year: Never true  Transportation Needs: No Transportation Needs (07/06/2024)  Epic    Lack of Transportation (Medical): No    Lack of Transportation (Non-Medical): No  Physical Activity: Insufficiently Active (07/06/2024)   Exercise Vital Sign    Days of Exercise per Week: 2 days    Minutes of Exercise per Session: 20 min  Stress: No Stress Concern Present (07/06/2024)   [employer] of Occupational Health - Occupational Stress Questionnaire    Feeling of Stress: Only a little  Social Connections: Socially Isolated (07/06/2024)   Social Connection and Isolation Panel    Frequency of Communication with Friends and Family: More than three times a week    Frequency of Social Gatherings with Friends and Family: Once a week    Attends Religious Services: Never    Database Administrator or Organizations: No     Attends Engineer, Structural: Not on file    Marital Status: Widowed  Depression (PHQ2-9): Medium Risk (07/06/2024)   Depression (PHQ2-9)    PHQ-2 Score: 6  Alcohol Screen: Low Risk (07/06/2024)   Alcohol Screen    Last Alcohol Screening Score (AUDIT): 0  Housing: Low Risk (07/06/2024)   Epic    Unable to Pay for Housing in the Last Year: No    Number of Times Moved in the Last Year: 0    Homeless in the Last Year: No  Utilities: Not At Risk (07/06/2024)   Epic    Threatened with loss of utilities: No  Health Literacy: Adequate Health Literacy (07/06/2024)   B1300 Health Literacy    Frequency of need for help with medical instructions: Never   Family History: Family History  Problem Relation Age of Onset   Heart disease Mother    Diabetes Father    Hypertension Father    Cirrhosis Father    Liver cancer Father    Heart disease Maternal Grandfather    Melanoma Paternal Grandmother    Lung cancer Paternal Uncle    Lung cancer Paternal Aunt    Asthma Daughter    Gallbladder disease Daughter    Erythema nodosum Daughter    Pancreatic cancer Neg Hx    Esophageal cancer Neg Hx    Allergies: Allergies[1] Medications: See med rec.  Review of Systems: No headache, visual changes, nausea, vomiting, diarrhea, constipation, dizziness, abdominal pain, skin rash, fevers, chills, night sweats, swollen lymph nodes, weight loss, chest pain, body aches, joint swelling, muscle aches, shortness of breath, mood changes, visual or auditory hallucinations.  Objective:    BP 126/83 (BP Location: Left Arm, Patient Position: Sitting, Cuff Size: Large)   Pulse 65   Temp 98.2 F (36.8 C) (Oral)   Resp 18   Ht 5' 10 (1.778 m)   Wt (!) 374 lb (169.6 kg)   SpO2 98%   BMI 53.66 kg/m   General: Well Developed, well nourished, and in no acute distress.  Neuro: Alert and oriented x3, extra-ocular muscles intact, sensation grossly intact. Cranial nerves II through XII are intact, motor,  sensory, and coordinative functions are all intact. HEENT: Normocephalic, atraumatic, pupils equal round reactive to light, neck supple, no masses, no lymphadenopathy, thyroid  nonpalpable. Oropharynx, nasopharynx, external ear canals are unremarkable. Skin: Warm and dry, no rashes noted.  Cardiac: Regular rate and rhythm, no murmurs rubs or gallops.  Respiratory: Clear to auscultation bilaterally. Not using accessory muscles, speaking in full sentences.  Abdominal: Soft, nontender, nondistended, positive bowel sounds, no masses, no organomegaly.  Musculoskeletal: Shoulder, elbow, wrist, hip, knee, ankle stable, and with full range of motion.  Impression and Recommendations:    Wellness examination Assessment & Plan: Routine HCM labs ordered. HCM reviewed/discussed. Anticipatory guidance regarding healthy weight, lifestyle and choices given. Recommend healthy diet.  Recommend approximately 150 minutes/week of moderate intensity exercise Recommend regular dental and vision exams Always use seatbelt/lap and shoulder restraints Recommend using smoke alarms and checking batteries at least twice a year Recommend using sunscreen when outside Discussed colon cancer screening recommendations, options.  Patient is UTD Discussed immunization recommendations   Obstructive sleep apnea -     Zepbound ; Inject 2.5 mg into the skin once a week.  Dispense: 2 mL; Refill: 1  Severe obesity (BMI >= 40) (HCC) Assessment & Plan: Long discussion today reviewing weight loss medications including injectable options including GLP-1 receptor agonist, oral agents including Contrave, orlistat, phentermine.  We discussed potential risk, benefits, cost associated with these various medications as well as the possibility of insurance coverage or lack thereof.  We discussed typical dosing regimen for both injectable and oral medications, proper administration of each.  We discussed potential outcomes with each. After  discussion of potential risks and adverse reactions with these medications and potential benefits of each, patient would like to proceed with injectable GLP-1 receptor agonist if possible.  Prescription sent to pharmacy on file, if medication is cost prohibitive for patient, he will let us  know and we can look to send an alternative option.  Will plan for close follow-up to monitor response to whichever medication patient is able to initiate.  Orders: -     Zepbound ; Inject 2.5 mg into the skin once a week.  Dispense: 2 mL; Refill: 1  Other orders -     Cyclobenzaprine  HCl; Take 1 tablet (5 mg total) by mouth at bedtime as needed for muscle spasms.  Dispense: 30 tablet; Refill: 0  Return in about 6 months (around 02/02/2025).  If patient is able to start with injectable medication, advised for him to contact us  to schedule 1 month follow-up so that we can assess progress with medication and titrate medication as tolerated.   ___________________________________________ Yashvi Jasinski de [place], MD, ABFM, CAQSM Primary Care and Sports [facility]    [1]  Allergies Allergen Reactions   Penicillins Anaphylaxis, Hives, Swelling and Other (See Comments)    Pt was 15 Has patient had a PCN reaction causing immediate rash, facial/tongue/throat swelling, SOB or lightheadedness with hypotension: Yes Has patient had a PCN reaction causing severe rash involving mucus membranes or skin necrosis: No Has patient had a PCN reaction that required hospitalization: Yes Has patient had a PCN reaction occurring within the last 10 years: No If all of the above answers are NO, then may proceed with Cephalosporin use.    "

## 2024-08-05 NOTE — Assessment & Plan Note (Signed)
 Routine HCM labs ordered. HCM reviewed/discussed. Anticipatory guidance regarding healthy weight, lifestyle and choices given. Recommend healthy diet.  Recommend approximately 150 minutes/week of moderate intensity exercise Recommend regular dental and vision exams Always use seatbelt/lap and shoulder restraints Recommend using smoke alarms and checking batteries at least twice a year Recommend using sunscreen when outside Discussed colon cancer screening recommendations, options.  Patient is UTD Discussed immunization recommendations

## 2024-08-07 ENCOUNTER — Other Ambulatory Visit (HOSPITAL_COMMUNITY): Payer: Self-pay

## 2024-08-07 ENCOUNTER — Telehealth (HOSPITAL_BASED_OUTPATIENT_CLINIC_OR_DEPARTMENT_OTHER): Payer: Self-pay | Admitting: Pharmacy Technician

## 2024-08-07 DIAGNOSIS — G4733 Obstructive sleep apnea (adult) (pediatric): Secondary | ICD-10-CM

## 2024-08-07 NOTE — Telephone Encounter (Signed)
 Pharmacy Patient Advocate Encounter   Received notification from [employer] CMM KEY that prior authorization for Wegovy  0.25MG /0.5ML auto-injectors is required/requested.   Insurance verification completed.   The patient is insured through [employer].   Per test claim: PA required; PA submitted to above mentioned insurance via Latent Key/confirmation #/EOC [id number] Status is pending

## 2024-08-10 ENCOUNTER — Other Ambulatory Visit (HOSPITAL_COMMUNITY): Payer: Self-pay

## 2024-08-10 NOTE — Telephone Encounter (Signed)
 Pharmacy Patient Advocate Encounter  Received notification from [employer] that Prior Authorization for Wegovy  0.25MG /0.5ML auto-injectors has been DENIED.  Full denial letter will be uploaded to the media tab. See denial reason below.   PA #/Case ID/Reference #: [id number]    Submitted a proactive prior authorization request for Zepbound  2.5mg /0.62ml auto-injectors due to patient having severe OSA.   Pharmacy Patient Advocate Encounter  Received notification from [employer] that Prior Authorization for Zepbound  2.5mg /0.5mg  has been APPROVED from 08/07/2024 to 02/03/2025. Ran test claim, Copay is $4.00. This test claim was processed through [facility]- copay amounts may vary at other pharmacies due to pharmacy/plan contracts, or as the patient moves through the different stages of their insurance plan.   PA #/Case ID/Reference #: [id number]   Please send a prescription for Zepbound  if appropriate to patient's preferred pharmacy.

## 2024-08-17 NOTE — Telephone Encounter (Signed)
 Following up on possible therapy change to already approved Zepbound . Please advise.

## 2024-08-18 ENCOUNTER — Other Ambulatory Visit (HOSPITAL_BASED_OUTPATIENT_CLINIC_OR_DEPARTMENT_OTHER): Payer: Self-pay

## 2024-08-18 LAB — TSH+FREE T4
Free T4: 0.71 ng/dL — ABNORMAL LOW (ref 0.82–1.77)
TSH: 10 u[IU]/mL — ABNORMAL HIGH (ref 0.450–4.500)

## 2024-08-18 MED ORDER — ZEPBOUND 2.5 MG/0.5ML ~~LOC~~ SOAJ
2.5000 mg | SUBCUTANEOUS | 1 refills | Status: AC
  Filled 2024-08-18: qty 2, 28d supply, fill #0

## 2024-08-19 NOTE — Telephone Encounter (Signed)
 Can we try to get pt scheduled for a follow up in about 1 month to reassess after starting zepbound .

## 2024-08-28 ENCOUNTER — Other Ambulatory Visit (HOSPITAL_BASED_OUTPATIENT_CLINIC_OR_DEPARTMENT_OTHER): Payer: Self-pay

## 2024-09-02 ENCOUNTER — Ambulatory Visit (HOSPITAL_BASED_OUTPATIENT_CLINIC_OR_DEPARTMENT_OTHER): Payer: Self-pay | Admitting: Family Medicine

## 2024-09-02 DIAGNOSIS — E039 Hypothyroidism, unspecified: Secondary | ICD-10-CM

## 2024-09-02 MED ORDER — LEVOTHYROXINE SODIUM 125 MCG PO TABS: 125.0000 ug | ORAL_TABLET | Freq: Every day | ORAL | 1 refills | Status: AC

## 2024-09-02 NOTE — Addendum Note (Signed)
 Addended by: DE CUBA, RJ J on: 09/02/2024 01:32 PM   Modules accepted: Orders

## 2024-09-04 NOTE — Telephone Encounter (Signed)
 Copied from CRM [id number]. Topic: Clinical - Lab/Test Results >> Sep 04, 2024 11:20 AM Victoria B wrote: Reason for CRM: gave patient lab results and scheduled future appt

## 2024-09-25 ENCOUNTER — Ambulatory Visit (HOSPITAL_BASED_OUTPATIENT_CLINIC_OR_DEPARTMENT_OTHER): Admitting: Family Medicine

## 2024-09-30 ENCOUNTER — Other Ambulatory Visit (HOSPITAL_BASED_OUTPATIENT_CLINIC_OR_DEPARTMENT_OTHER)

## 2025-02-02 ENCOUNTER — Ambulatory Visit (HOSPITAL_BASED_OUTPATIENT_CLINIC_OR_DEPARTMENT_OTHER): Admitting: Family Medicine
